# Patient Record
Sex: Male | Born: 1935
Health system: Southern US, Community
[De-identification: ages and names within clinical notes are randomized; demographics above are authoritative.]

## PROBLEM LIST (undated history)

## (undated) DIAGNOSIS — I10 Essential (primary) hypertension: Secondary | ICD-10-CM

## (undated) DIAGNOSIS — E119 Type 2 diabetes mellitus without complications: Secondary | ICD-10-CM

## (undated) DIAGNOSIS — M1990 Unspecified osteoarthritis, unspecified site: Secondary | ICD-10-CM

## (undated) HISTORY — PX: JOINT REPLACEMENT: SHX530

## (undated) HISTORY — PX: SHOULDER SURGERY: SHX246

---

## 2005-05-02 ENCOUNTER — Emergency Department (HOSPITAL_COMMUNITY): Admission: EM | Admit: 2005-05-02 | Discharge: 2005-05-02 | Payer: Self-pay | Admitting: *Deleted

## 2008-03-24 ENCOUNTER — Encounter: Admission: RE | Admit: 2008-03-24 | Discharge: 2008-03-24 | Payer: Self-pay | Admitting: Orthopaedic Surgery

## 2014-04-03 DIAGNOSIS — I10 Essential (primary) hypertension: Secondary | ICD-10-CM | POA: Diagnosis not present

## 2014-04-03 DIAGNOSIS — E119 Type 2 diabetes mellitus without complications: Secondary | ICD-10-CM | POA: Diagnosis not present

## 2014-04-03 DIAGNOSIS — M109 Gout, unspecified: Secondary | ICD-10-CM | POA: Diagnosis not present

## 2014-04-03 DIAGNOSIS — F419 Anxiety disorder, unspecified: Secondary | ICD-10-CM | POA: Diagnosis not present

## 2014-04-03 DIAGNOSIS — M5430 Sciatica, unspecified side: Secondary | ICD-10-CM | POA: Diagnosis not present

## 2014-06-25 DIAGNOSIS — E119 Type 2 diabetes mellitus without complications: Secondary | ICD-10-CM | POA: Diagnosis not present

## 2014-06-25 DIAGNOSIS — E559 Vitamin D deficiency, unspecified: Secondary | ICD-10-CM | POA: Diagnosis not present

## 2014-06-25 DIAGNOSIS — I10 Essential (primary) hypertension: Secondary | ICD-10-CM | POA: Diagnosis not present

## 2014-06-25 DIAGNOSIS — E139 Other specified diabetes mellitus without complications: Secondary | ICD-10-CM | POA: Diagnosis not present

## 2014-06-25 DIAGNOSIS — Z79899 Other long term (current) drug therapy: Secondary | ICD-10-CM | POA: Diagnosis not present

## 2014-07-02 DIAGNOSIS — E119 Type 2 diabetes mellitus without complications: Secondary | ICD-10-CM | POA: Diagnosis not present

## 2014-07-02 DIAGNOSIS — M5430 Sciatica, unspecified side: Secondary | ICD-10-CM | POA: Diagnosis not present

## 2014-07-02 DIAGNOSIS — F419 Anxiety disorder, unspecified: Secondary | ICD-10-CM | POA: Diagnosis not present

## 2014-07-02 DIAGNOSIS — M109 Gout, unspecified: Secondary | ICD-10-CM | POA: Diagnosis not present

## 2014-07-02 DIAGNOSIS — E139 Other specified diabetes mellitus without complications: Secondary | ICD-10-CM | POA: Diagnosis not present

## 2014-07-02 DIAGNOSIS — I10 Essential (primary) hypertension: Secondary | ICD-10-CM | POA: Diagnosis not present

## 2014-10-06 DIAGNOSIS — E559 Vitamin D deficiency, unspecified: Secondary | ICD-10-CM | POA: Diagnosis not present

## 2014-10-06 DIAGNOSIS — F419 Anxiety disorder, unspecified: Secondary | ICD-10-CM | POA: Diagnosis not present

## 2014-10-06 DIAGNOSIS — E119 Type 2 diabetes mellitus without complications: Secondary | ICD-10-CM | POA: Diagnosis not present

## 2014-10-06 DIAGNOSIS — I10 Essential (primary) hypertension: Secondary | ICD-10-CM | POA: Diagnosis not present

## 2014-10-06 DIAGNOSIS — G4733 Obstructive sleep apnea (adult) (pediatric): Secondary | ICD-10-CM | POA: Diagnosis not present

## 2014-10-06 DIAGNOSIS — E139 Other specified diabetes mellitus without complications: Secondary | ICD-10-CM | POA: Diagnosis not present

## 2014-10-06 DIAGNOSIS — M5430 Sciatica, unspecified side: Secondary | ICD-10-CM | POA: Diagnosis not present

## 2014-11-24 DIAGNOSIS — H35372 Puckering of macula, left eye: Secondary | ICD-10-CM | POA: Diagnosis not present

## 2014-11-24 DIAGNOSIS — H40013 Open angle with borderline findings, low risk, bilateral: Secondary | ICD-10-CM | POA: Diagnosis not present

## 2014-11-24 DIAGNOSIS — Z961 Presence of intraocular lens: Secondary | ICD-10-CM | POA: Diagnosis not present

## 2014-11-24 DIAGNOSIS — E119 Type 2 diabetes mellitus without complications: Secondary | ICD-10-CM | POA: Diagnosis not present

## 2014-12-21 DIAGNOSIS — I10 Essential (primary) hypertension: Secondary | ICD-10-CM | POA: Diagnosis not present

## 2014-12-21 DIAGNOSIS — E119 Type 2 diabetes mellitus without complications: Secondary | ICD-10-CM | POA: Diagnosis not present

## 2015-01-15 DIAGNOSIS — E119 Type 2 diabetes mellitus without complications: Secondary | ICD-10-CM | POA: Diagnosis not present

## 2015-01-15 DIAGNOSIS — I10 Essential (primary) hypertension: Secondary | ICD-10-CM | POA: Diagnosis not present

## 2015-02-16 DIAGNOSIS — M1990 Unspecified osteoarthritis, unspecified site: Secondary | ICD-10-CM | POA: Diagnosis not present

## 2015-02-16 DIAGNOSIS — I10 Essential (primary) hypertension: Secondary | ICD-10-CM | POA: Diagnosis not present

## 2015-03-13 DIAGNOSIS — M1990 Unspecified osteoarthritis, unspecified site: Secondary | ICD-10-CM | POA: Diagnosis not present

## 2015-03-13 DIAGNOSIS — I10 Essential (primary) hypertension: Secondary | ICD-10-CM | POA: Diagnosis not present

## 2015-03-21 ENCOUNTER — Encounter (HOSPITAL_COMMUNITY): Payer: Self-pay

## 2015-03-21 ENCOUNTER — Emergency Department (HOSPITAL_COMMUNITY)
Admission: EM | Admit: 2015-03-21 | Discharge: 2015-03-21 | Disposition: A | Discharge status: Home / Self Care | Payer: Medicare Other | Attending: Emergency Medicine | Admitting: Emergency Medicine

## 2015-03-21 ENCOUNTER — Emergency Department (HOSPITAL_COMMUNITY): Payer: Medicare Other

## 2015-03-21 DIAGNOSIS — I10 Essential (primary) hypertension: Secondary | ICD-10-CM | POA: Insufficient documentation

## 2015-03-21 DIAGNOSIS — W01198A Fall on same level from slipping, tripping and stumbling with subsequent striking against other object, initial encounter: Secondary | ICD-10-CM | POA: Diagnosis not present

## 2015-03-21 DIAGNOSIS — S0101XA Laceration without foreign body of scalp, initial encounter: Secondary | ICD-10-CM

## 2015-03-21 DIAGNOSIS — E119 Type 2 diabetes mellitus without complications: Secondary | ICD-10-CM | POA: Insufficient documentation

## 2015-03-21 DIAGNOSIS — Y998 Other external cause status: Secondary | ICD-10-CM | POA: Insufficient documentation

## 2015-03-21 DIAGNOSIS — S0990XA Unspecified injury of head, initial encounter: Secondary | ICD-10-CM | POA: Diagnosis not present

## 2015-03-21 DIAGNOSIS — Z8739 Personal history of other diseases of the musculoskeletal system and connective tissue: Secondary | ICD-10-CM | POA: Insufficient documentation

## 2015-03-21 DIAGNOSIS — Y9389 Activity, other specified: Secondary | ICD-10-CM | POA: Insufficient documentation

## 2015-03-21 DIAGNOSIS — S199XXA Unspecified injury of neck, initial encounter: Secondary | ICD-10-CM | POA: Diagnosis not present

## 2015-03-21 DIAGNOSIS — Y9289 Other specified places as the place of occurrence of the external cause: Secondary | ICD-10-CM | POA: Diagnosis not present

## 2015-03-21 HISTORY — DX: Essential (primary) hypertension: I10

## 2015-03-21 HISTORY — DX: Unspecified osteoarthritis, unspecified site: M19.90

## 2015-03-21 HISTORY — DX: Type 2 diabetes mellitus without complications: E11.9

## 2015-03-21 LAB — CBC
HCT: 39.3 % (ref 39.0–52.0)
Hemoglobin: 10.1 g/dL — ABNORMAL LOW (ref 13.0–17.0)
MCH: 23.8 pg — ABNORMAL LOW (ref 26.0–34.0)
MCHC: 25.7 g/dL — ABNORMAL LOW (ref 30.0–36.0)
MCV: 92.7 fL (ref 78.0–100.0)
Platelets: 205 10*3/uL (ref 150–400)
RBC: 4.24 MIL/uL (ref 4.22–5.81)
RDW: 14.6 % (ref 11.5–15.5)
WBC: 11.4 10*3/uL — ABNORMAL HIGH (ref 4.0–10.5)

## 2015-03-21 LAB — BASIC METABOLIC PANEL
Anion gap: 9 (ref 5–15)
BUN: 43 mg/dL — ABNORMAL HIGH (ref 6–20)
CO2: 24 mmol/L (ref 22–32)
Calcium: 9 mg/dL (ref 8.9–10.3)
Chloride: 107 mmol/L (ref 101–111)
Creatinine, Ser: 1.21 mg/dL (ref 0.61–1.24)
GFR calc Af Amer: >60 mL/min (ref >60)
GFR calc non Af Amer: 54 mL/min — ABNORMAL LOW (ref >60)
Glucose, Bld: 128 mg/dL — ABNORMAL HIGH (ref 65–99)
Potassium: 4.6 mmol/L (ref 3.5–5.1)
Sodium: 140 mmol/L (ref 135–145)

## 2015-03-21 MED ORDER — LIDOCAINE-EPINEPHRINE 1 %-1:100000 IJ SOLN
INTRAMUSCULAR | Status: AC
Start: 2015-03-21 — End: 2015-03-21
  Administered 2015-03-21: 3 mL
  Filled 2015-03-21: qty 1

## 2015-03-21 NOTE — Discharge Instructions (Signed)
Please read and follow all provided instructions.  Your diagnoses today include:  1. Scalp laceration, initial encounter    Tests performed today include:  Vital signs. See below for your results today.   Medications prescribed:   None   Home care instructions:  Follow any educational materials contained in this packet.  Follow-up instructions: Please follow-up with your primary care provider or Emergency Department in 7 days for removal of staples.   Return instructions:   Please return to the Emergency Department if you do not get better, if you get worse, or new symptoms OR  - Fever (temperature greater than 101.81F)  - Bleeding that does not stop with holding pressure to the area    -Severe pain (please note that you may be more sore the day after your accident)  - Chest Pain  - Difficulty breathing  - Severe nausea or vomiting  - Inability to tolerate food and liquids  - Passing out  - Skin becoming red around your wounds  - Change in mental status (confusion or lethargy)  - New numbness or weakness     Please return if you have any other emergent concerns.  Additional Information:  Your vital signs today were: BP 147/75 mmHg   Pulse 89   Temp(Src) 98 F (36.7 C) (Oral)   Resp 18   SpO2 98% If your blood pressure (BP) was elevated above 135/85 this visit, please have this repeated by your doctor within one month. ---------------

## 2015-03-21 NOTE — ED Notes (Addendum)
Pt c/o posterior head laceration after a fall this morning.  Pain score 2/10.  Pt reports "I got up, before I woke up all the way.  I misstepped and fell."  Denies LOC and dizziness.  Denies blood thinners.  Pt reports hitting his head on his nightstand.

## 2015-03-21 NOTE — ED Provider Notes (Signed)
CSN: [id number]     Arrival date & time 03/21/15  [id number] History   First MD Initiated Contact with Patient 03/21/15 [phone]     Chief Complaint  Patient presents with  . Fall  . Head Laceration   (Consider location/radiation/quality/duration/timing/severity/associated sxs/prior Treatment) HPI 82 y.o. male with a hx of HTN, DM, presents to the Emergency Department today after a fall sustained this morning. States that he woke up and sat on the side of the bed. As he stood up, he lost his balance and hit the right side of his head on the nightstand. Pt remembers the incident. Denies LOC. Pt laid on the floor and told his wife to call EMS. Pain currently 2/10 on posterior scalp. No headache/dizziness, no N/V/D, No vision changes, no CP/SOB/ABD pain. No numbness/tingling in extremities. Pt is not on any blood thinners. No other symptoms noted.      Past Medical History  Diagnosis Date  . Diabetes mellitus without complication ([place])   . Hypertension   . Arthritis    Past Surgical History  Procedure Laterality Date  . Joint replacement Bilateral   . Shoulder surgery     History reviewed. No pertinent family history. Social History  Substance Use Topics  . Smoking status: Never Smoker   . Smokeless tobacco: None  . Alcohol Use: No    Review of Systems ROS reviewed and all are negative for acute change except as noted in the HPI.  Allergies  Review of patient's allergies indicates not on file.  Home Medications   Prior to Admission medications   Not on File   BP 147/75 mmHg  Pulse 89  Temp(Src) 98 F (36.7 C) (Oral)  Resp 18  SpO2 98%   Physical Exam  Constitutional: He is oriented to person, place, and time. He appears well-developed and well-nourished.  HENT:  Head: Normocephalic and atraumatic.  Right Ear: Tympanic membrane, external ear and ear canal normal. No hemotympanum.  Left Ear: Tympanic membrane, external ear and ear canal normal. No hemotympanum.  Nose: Nose  normal.  Mouth/Throat: Uvula is midline, oropharynx is clear and moist and mucous membranes are normal.  Posterior head laceration on right side 2-3cm  Eyes: EOM are normal. Pupils are equal, round, and reactive to light.  Neck: Trachea normal, normal range of motion and full passive range of motion without pain. Neck supple.  Cardiovascular: Normal rate, regular rhythm, S1 normal, S2 normal and normal heart sounds.   Pulmonary/Chest: Effort normal and breath sounds normal.  Abdominal: Soft. Normal appearance and bowel sounds are normal.  Musculoskeletal: Normal range of motion.       Cervical back: Normal. He exhibits normal range of motion, no tenderness and no pain.  Neurological: He is alert and oriented to person, place, and time. He has normal strength. No cranial nerve deficit or sensory deficit.  Skin: Skin is warm and dry.  Psychiatric: He has a normal mood and affect. His behavior is normal. Thought content normal.  Nursing note and vitals reviewed.   ED Course  .Marland KitchenLaceration Repair Date/Time: 03/21/2015 11:07 AM Performed by: Shary Decamp Authorized by: Shary Decamp Consent: Verbal consent obtained. Risks and benefits: risks, benefits and alternatives were discussed Consent given by: patient Patient understanding: patient states understanding of the procedure being performed Patient consent: the patient's understanding of the procedure matches consent given Procedure consent: procedure consent matches procedure scheduled Patient identity confirmed: verbally with patient and arm band Body area: head/neck Location details: scalp Laceration length: 3  cm Foreign bodies: no foreign bodies Tendon involvement: none Nerve involvement: none Vascular damage: no Anesthesia: local infiltration Local anesthetic: lidocaine 1% with epinephrine Anesthetic total: 1 ml Patient sedated: no Preparation: Patient was prepped and draped in the usual sterile fashion. Irrigation solution:  saline Amount of cleaning: standard Debridement: none Degree of undermining: none Skin closure: staples Number of sutures: 3 Approximation: close Patient tolerance: Patient tolerated the procedure well with no immediate complications   (including critical care time) Labs Review Labs Reviewed  CBC - Abnormal; Notable for the following:    WBC 11.4 (*)    Hemoglobin 10.1 (*)    MCH 23.8 (*)    MCHC 25.7 (*)    All other components within normal limits  BASIC METABOLIC PANEL - Abnormal; Notable for the following:    Glucose, Bld 128 (*)    BUN 43 (*)    GFR calc non Af Amer 54 (*)    All other components within normal limits   Imaging Review Ct Head Wo Contrast  03/21/2015  CLINICAL DATA:  Recent fall with posterior head laceration EXAM: CT HEAD WITHOUT CONTRAST CT CERVICAL SPINE WITHOUT CONTRAST TECHNIQUE: Multidetector CT imaging of the head and cervical spine was performed following the standard protocol without intravenous contrast. Multiplanar CT image reconstructions of the cervical spine were also generated. COMPARISON:  None. FINDINGS: CT HEAD FINDINGS The bony calvarium is intact. Mild atrophic changes and chronic white matter ischemic change is noted. No findings to suggest acute hemorrhage, acute infarction or space-occupying mass lesion are noted. CT CERVICAL SPINE FINDINGS Seven cervical segments are well visualized. Vertebral body height is well maintained. Mild degenerative anterolisthesis of C3 on C4 is noted. Multilevel osteophytic changes and disc space narrowing is seen. Multilevel neural foraminal narrowing is noted secondary to degenerative changes. No acute fracture is seen. No gross soft tissue abnormality is noted. IMPRESSION: CT of the head: Chronic atrophic and ischemic changes without acute abnormality. CT of the cervical spine: Multilevel degenerative change without acute abnormality. Electronically Signed   By: Inez Catalina M.D.   On: 03/21/2015 10:01   Ct  Cervical Spine Wo Contrast  03/21/2015  CLINICAL DATA:  Recent fall with posterior head laceration EXAM: CT HEAD WITHOUT CONTRAST CT CERVICAL SPINE WITHOUT CONTRAST TECHNIQUE: Multidetector CT imaging of the head and cervical spine was performed following the standard protocol without intravenous contrast. Multiplanar CT image reconstructions of the cervical spine were also generated. COMPARISON:  None. FINDINGS: CT HEAD FINDINGS The bony calvarium is intact. Mild atrophic changes and chronic white matter ischemic change is noted. No findings to suggest acute hemorrhage, acute infarction or space-occupying mass lesion are noted. CT CERVICAL SPINE FINDINGS Seven cervical segments are well visualized. Vertebral body height is well maintained. Mild degenerative anterolisthesis of C3 on C4 is noted. Multilevel osteophytic changes and disc space narrowing is seen. Multilevel neural foraminal narrowing is noted secondary to degenerative changes. No acute fracture is seen. No gross soft tissue abnormality is noted. IMPRESSION: CT of the head: Chronic atrophic and ischemic changes without acute abnormality. CT of the cervical spine: Multilevel degenerative change without acute abnormality. Electronically Signed   By: Inez Catalina M.D.   On: 03/21/2015 10:01   I have personally reviewed and evaluated these images and lab results as part of my medical decision-making.   EKG Interpretation   Date/Time:  Sunday March 21 2015 10:32:21 EST Ventricular Rate:  86 PR Interval:    QRS Duration: 116 QT Interval:  366  QTC Calculation: 438 R Axis:   13 Text Interpretation:  Nonspecific intraventricular conduction delay  Inferior infarct, old baseline artifact. no STEMI..c/w old no sig change  Confirmed by Johnney Killian, MD, Jeannie Done [phone]) on 03/21/2015 11:03:31 AM     MDM  I have reviewed relevant laboratory values. I have reviewed relevant imaging studies. I personally interpreted the relevant EKG. I have reviewed the  relevant previous healthcare records. I have reviewed EMS Documentation. I obtained HPI from historian. Patient discussed with supervising physician  ED Course:  Assessment: 33 y M with hx DM, HTN presents after a fall sustained this morning. No LOC. He remembers the event clearly. No CN deficits appreciated. No motor/sensory deficits. No headache/vision changes. EKG unremarkable. CBC/BMP showed Hgb at 10.1 with no previous labs to compare. CT Head/Neck show no acute abnormalities. Pt has 2-3cm posterior laceration that was close with 3 staples. Will DC and have them f/u with PCP or ED for staples to be removed.   Patient is in no acute distress. Vital Signs are stable. Patient is able to ambulate. Patient able to tolerate PO.    Disposition/Plan:  DC Home Additional Verbal discharge instructions given and discussed with patient.  Pt Instructed to f/u with PCP or ED in the next 48 hours for evaluation and treatment of symptoms. Return precautions given Pt acknowledges and agrees with plan  Supervising Physician Quintella Reichert, MD   Final diagnoses:  Scalp laceration, initial encounter      Shary Decamp, PA-C 03/21/15 [place], MD 03/23/15 [phone]

## 2015-03-28 ENCOUNTER — Emergency Department (HOSPITAL_COMMUNITY)
Admission: EM | Admit: 2015-03-28 | Discharge: 2015-03-28 | Disposition: A | Discharge status: Home / Self Care | Payer: Medicare Other | Attending: Emergency Medicine | Admitting: Emergency Medicine

## 2015-03-28 ENCOUNTER — Encounter (HOSPITAL_COMMUNITY): Payer: Self-pay | Admitting: *Deleted

## 2015-03-28 DIAGNOSIS — Z7984 Long term (current) use of oral hypoglycemic drugs: Secondary | ICD-10-CM | POA: Insufficient documentation

## 2015-03-28 DIAGNOSIS — Z79899 Other long term (current) drug therapy: Secondary | ICD-10-CM | POA: Insufficient documentation

## 2015-03-28 DIAGNOSIS — Z7982 Long term (current) use of aspirin: Secondary | ICD-10-CM | POA: Diagnosis not present

## 2015-03-28 DIAGNOSIS — I10 Essential (primary) hypertension: Secondary | ICD-10-CM | POA: Insufficient documentation

## 2015-03-28 DIAGNOSIS — E119 Type 2 diabetes mellitus without complications: Secondary | ICD-10-CM | POA: Diagnosis not present

## 2015-03-28 DIAGNOSIS — Z4802 Encounter for removal of sutures: Secondary | ICD-10-CM | POA: Diagnosis not present

## 2015-03-28 DIAGNOSIS — M1990 Unspecified osteoarthritis, unspecified site: Secondary | ICD-10-CM | POA: Diagnosis not present

## 2015-03-28 NOTE — ED Notes (Signed)
Awaiting pt's arrival to room.

## 2015-03-28 NOTE — ED Notes (Signed)
Pt here for staple removal, 3 noted to right parietal.

## 2015-03-28 NOTE — ED Provider Notes (Signed)
CSN: [id number]     Arrival date & time 03/28/15  1459 History  By signing my name below, I, William Mullins, attest that this documentation has been prepared under the direction and in the presence of William Ramus, PA-C Electronically Signed: Soijett Mullins, ED Scribe. 03/28/2015. 4:40 PM.   Chief Complaint  Patient presents with  . Suture / Staple Removal      The history is provided by the patient. No language interpreter was used.    William Mullins is a 82 y.o. male with a PMHx of DM, HTN, who presents to the Emergency Department complaining of suture removal onset today. Pt had the sutures placed to his posterior right side of his head because of a mechanical fall. Denies any other recent falls. He states that he has not been using any medications or antibiotic ointment. Denies drainage, fever, HA, redness, swelling and any other symptoms.  Per pt chart review: Pt was seen in the ED on 03/21/2015 for a head laceration due to a mechanical fall. Pt had 3 staples placed to posterior right side of head and was informed to follow up for suture removal.  Past Medical History  Diagnosis Date  . Diabetes mellitus without complication ([place])   . Hypertension   . Arthritis    Past Surgical History  Procedure Laterality Date  . Joint replacement Bilateral   . Shoulder surgery     No family history on file. Social History  Substance Use Topics  . Smoking status: Never Smoker   . Smokeless tobacco: None  . Alcohol Use: No    Review of Systems  Constitutional: Negative for fever.  Skin: Positive for wound.       3 staples  Neurological: Negative for headaches.  All other systems reviewed and are negative.     Allergies  Review of patient's allergies indicates no known allergies.  Home Medications   Prior to Admission medications   Medication Sig Start Date End Date Taking? Authorizing Provider  allopurinol (ZYLOPRIM) 100 MG tablet Take 100 mg by mouth daily. 03/17/15   Historical  Provider, MD  amLODipine (NORVASC) 5 MG tablet Take 5 mg by mouth daily.    Historical Provider, MD  aspirin EC 81 MG tablet Take 81 mg by mouth daily.    Historical Provider, MD  benazepril (LOTENSIN) 10 MG tablet Take 10 mg by mouth daily.    Historical Provider, MD  cholecalciferol (VITAMIN D) 1000 units tablet Take 1,000 Units by mouth daily.    Historical Provider, MD  clonazePAM (KLONOPIN) 1 MG tablet Take 1 mg by mouth at bedtime.    Historical Provider, MD  Cyanocobalamin (VITAMIN B 12 PO) Take 1 tablet by mouth daily.    Historical Provider, MD  etodolac (LODINE) 400 MG tablet Take 400 mg by mouth 2 (two) times daily.    Historical Provider, MD  metFORMIN (GLUCOPHAGE) 500 MG tablet Take 500 mg by mouth daily.    Historical Provider, MD  Multiple Vitamin (MULTIVITAMIN WITH MINERALS) TABS tablet Take 1 tablet by mouth daily.    Historical Provider, MD  oxyCODONE-acetaminophen (PERCOCET) 10-325 MG tablet Take 1 tablet by mouth every 6 (six) hours as needed for pain.    Historical Provider, MD  vitamin C (ASCORBIC ACID) 500 MG tablet Take 500 mg by mouth daily.    Historical Provider, MD  vitamin E 400 UNIT capsule Take 400 Units by mouth daily.    Historical Provider, MD   BP 129/78 mmHg  Pulse 89  Temp(Src) 98 F (36.7 C) (Oral)  Resp 18  SpO2 98% Physical Exam  Constitutional: He is oriented to person, place, and time. He appears well-developed and well-nourished. No distress.  HENT:  Head: Normocephalic and atraumatic.  Well healing laceration to right posterior scalp. No surrounding swelling, erythema, warmth, or drainage. 3 staples intact.   Eyes: Conjunctivae and EOM are normal. Right eye exhibits no discharge. Left eye exhibits no discharge. No scleral icterus.  Neck: Neck supple.  Cardiovascular: Normal rate.   Pulmonary/Chest: Effort normal. No respiratory distress.  Musculoskeletal: Normal range of motion.  Neurological: He is alert and oriented to person, place, and  time.  Skin: Skin is warm and dry.  Psychiatric: He has a normal mood and affect. His behavior is normal.  Nursing note and vitals reviewed.   ED Course  Procedures (including critical care time) DIAGNOSTIC STUDIES: Oxygen Saturation is 98% on RA, nl by my interpretation.    COORDINATION OF CARE: 4:40 PM Discussed treatment plan with pt at bedside which includes staple removal and pt agreed to plan.    Labs Review Labs Reviewed - No data to display  Imaging Review No results found.   Filed Vitals:   03/28/15 1629  BP: 129/78  Pulse: 89  Temp: 98 F (36.7 C)  Resp: 18     MDM   Final diagnoses:  Encounter for staple removal    Staple removal   Pt to ER for staple removal and wound check as above. Procedure tolerated well. Vitals normal, no signs of infection. Scar minimization & return precautions given at discharge.   I personally performed the services described in this documentation, which was scribed in my presence. The recorded information has been reviewed and is accurate.    Chesley Noon Driscoll, [place] 03/28/15 1650  Blanchie Dessert, MD 03/29/15 [phone]

## 2015-03-28 NOTE — Discharge Instructions (Signed)
Continue taking your home medications as prescribed. I recommend applying bacitracin antibiotic ointment to your wound daily. Keep wound clean using soap and water and dry. Call your primary care providers office tomorrow to schedule a follow-up appointment for your recent fall. Return to the emergency department if symptoms worsen or new onset of fever, headache, redness, swelling, drainage.   Emergency Department Resource Guide 1) Find a Doctor and Pay Out of Pocket Although you won't have to find out who is covered by your insurance plan, it is a good idea to ask around and get recommendations. You will then need to call the office and see if the doctor you have chosen will accept you as a new patient and what types of options they offer for patients who are self-pay. Some doctors offer discounts or will set up payment plans for their patients who do not have insurance, but you will need to ask so you aren't surprised when you get to your appointment.  2) Contact Your Local Health Department Not all health departments have doctors that can see patients for sick visits, but many do, so it is worth a call to see if yours does. If you don't know where your local health department is, you can check in your phone book. The CDC also has a tool to help you locate your state's health department, and many state websites also have listings of all of their local health departments.  3) Find a [facility] If your illness is not likely to be very severe or complicated, you may want to try a walk in clinic. These are popping up all over the country in pharmacies, drugstores, and shopping centers. They're usually staffed by nurse practitioners or physician assistants that have been trained to treat common illnesses and complaints. They're usually fairly quick and inexpensive. However, if you have serious medical issues or chronic medical problems, these are probably not your best option.  No Primary Care  Doctor: - Call Health Connect at  [phone] - they can help you locate a primary care doctor that  accepts your insurance, provides certain services, etc. - Physician Referral Service- [phone]  Chronic Pain Problems: Organization         Address  Phone   Notes  [facility]  [phone] Patients need to be referred by their primary care doctor.   Medication Assistance: Organization         Address  Phone   Notes  [facility] Medication [facility] [address] [place], [place] [address] [phone] --Must be a resident of [facility] -- Must have NO insurance coverage whatsoever (no Medicaid/ Medicare, etc.) -- The pt. MUST have a primary care doctor that directs their care regularly and follows them in the community   Med[facility]ist  [phone]   [employer]  [phone]    Agencies that provide inexpensive medical care: Organization         Address  Phone   Notes  [place]  [phone]   [facility]    [phone]   [facility] [place], [place] [address] [phone]   [place] [address] [place] [phone]   Planned Parenthood    9370537048   [facility]    [phone]   [place] and [place], [employer] Phone:  [phone],  Fax:  [phone] Hours of Operation:  9 am - 6 pm, M-F.  Also accepts Medicaid/Medicare and self-pay.  [facility] for [place], [address], [place] Phone: [phone], Fax: [phone]. Hours of Operation:  8:30 am - 5:30 pm, M-F.  Also accepts Medicaid and self-pay.  [facility] [place] [address] [place] Phone: [phone]   [place], [place], [place] [phone], Ext. [phone] Mondays & Thursdays: 7-9 AM.  First 15 patients are seen on a first  come, first serve basis.    [place] Providers:  Organization         Address  Phone   Notes  [facility] [address], [place] [phone] Also accepts self-pay patients.  [facility] [address], [place]  [phone]   [place], [address], [place] [phone]   [facility] [address] [place] [phone]   Lucianne Lei [address], [place]   [phone] Only accepts [place] Access [place] patients after they have their name applied to their card.   Self-Pay (no insurance) in [facility]:  Organization         Address  Phone   Notes  Sickle Cell Patients, [facility] Internal Medicine [place] [phone]   [facility] Urgent Care [place] [phone]   [facility] [place]  [place], [place], [place] [phone]   [facility]/Dr. Osei-Bonsu  [address] [place] or [address], [place] [phone] Phone number for both [place] and [place] locations is the same.  [facility] [address] [place] [phone]   [facility] [address], [place] or [address] [phone]   [facility] [address] [place] [phone], phone; [phone], fax Sees patients 1st and 3rd Saturday of every month.  Must not qualify for public or private insurance (i.e. Medicaid, Medicare, [place] Health Choice, Veterans' Benefits)  Household income should be no more than 200% of the poverty level The clinic cannot treat you if you are pregnant or think you are pregnant  Sexually transmitted diseases are not treated at the clinic.    Dental Care: Organization          Address  Phone  Notes  [facility] of [facility] [place] [phone] Accepts children up to age 36 who are enrolled in [place] or [place]; pregnant women with a Medicaid card; and children who have applied for Medicaid or [place] Health Choice, but were declined, whose parents can pay a reduced fee at time of service.  [facility] Department of [facility]  [address], [place] [phone] Accepts children up to age 98 who are enrolled in [place] or [place]; pregnant women with a Medicaid card; and children who have applied for Medicaid or [place] Health Choice, but were declined, whose parents can pay a reduced fee at time of service.  [place] Adult Dental Access PROGRAM  [place] [phone] Patients are seen by appointment only. Walk-ins are not  accepted. [place] will see patients 13 years of age and older. Monday - Tuesday (8am-5pm) Most Wednesdays (8:30-5pm) $30 per visit, cash only  [facility] Adult Dental Access PROGRAM  [address], [facility] [phone] Patients are seen by appointment only. Walk-ins are not accepted. [place] will see patients 33 years of age and older. One Wednesday Evening (Monthly: Volunteer Based).  $30 per visit, cash only  [place]  [phone] for adults; Children under age 30, call Graduate Pediatric Dentistry at [phone]. Children aged 41-14, please call [phone] to request a pediatric application.  Dental services are provided in all areas of dental care including fillings, crowns and bridges, complete and partial dentures, implants, gum treatment, root canals, and extractions. Preventive care is also provided. Treatment is provided to both adults and children. Patients are selected via a lottery and there is often a waiting list.   [facility] [address] [place]  [phone] [email or web address]   [facility] [address] [place], [place] [phone], Ext. [phone] Second and Fourth Thursday of each month, opens at 6:30 AM; Clinic ends at 9 AM.  Patients are seen on a first-come first-served basis, and a limited number are seen during each clinic.   [facility]  [address] Hillard Danker [place], [place] [phone]   Eligibility Requirements You must have lived in [place], [place], or [place] counties for at least the last three months.   You cannot be eligible for state or federal sponsored [employer], including [employer], [place], or [employer].   You generally cannot be eligible for healthcare insurance through your employer.    How to apply: Eligibility screenings are held every Tuesday and Wednesday afternoon from 1:00 pm until 4:00 pm. You do not need an appointment for the interview!  [facility] [address], [place], [place]   [place]  [employer] Department  [place]  [phone]    Behavioral Health Resources in the Community: Intensive Outpatient Programs Organization         Address  Phone  Notes  [place]. [address], [place], [place] [phone]   [facility] Outpatient [address] [place], [place]   ADS: Alcohol & Drug Svcs [address] [place], [place]   [place] [address],  [place], [place] or [phone]   Substance Abuse Resources Organization         Address  Phone  Notes  Alcohol and Drug Services  [phone]   [place]  [phone]   [facility] [place]   Chinita Pester  [phone]   Residential & Outpatient Substance Abuse Program  [phone]   Psychological  Services Organization         Address  Phone  Notes  [facility] [place]  [place]  [phone]   [place] [address], [place] or [phone]    Mobile Crisis Teams Organization         Address  Phone  Notes  [facility], Mobile Crisis Care Unit  [phone]   [facility]  [address] [place], [place]   [facility] [address] [place] [phone]    Self-Help/Support Groups  Organization         Address  Phone             Notes  [facility]ental [facility]oc[facility] of [place] - variety of support groups  [place] Call for more information  Narcotics Anonymous (NA), [facility] [address], [employer] [place]  2 meetings at this location   Special educational needs teacher         Address  Phone  Notes  [facility]AP Residential Treatment [place],    [place]  [phone]   [facility]  [address], [place] [address], [place], [place]   [place], [place] [phone] Admissions: 8am-3pm M-F  Incentives Substance [place] [address]    [place], [place] [phone]   [facility] [address] [place], [place], [place]   [facility] [address],  [place], [place]   [facility] Programs - Intensive Outpatient [address] Kristeen Mans [address], [place], [place]   [facility] ([place].,  [place], [place] [phone] or [phone]   [facility] Services (RTS) [address], [place], [place] Accepts Medicaid  [facility] [place] [address]  [place] [phone] Substance Abuse/Addiction Treatment   [facility] Organization         Address  Phone  Notes  [facility]  [phone]   Domenic Schwab, PhD [address] Arlis Porta [place], [place]   [phone] or [phone]   [place], [place] [phone]   [facility] [address], [place], [place] [phone] Insurance/Medicaid/sponsorship through [facility] and Families [address]                                    [place], [place] [phone] [place] [address], [place] [phone]    Dr. Adele Schilder  [phone]   [facility] Dept. 1) [address], [place] 2) [place] 3)  [place] [address], [place] [phone]  [phone]   [place] [phone] or [phone] (After Hours)

## 2015-04-07 DIAGNOSIS — H40013 Open angle with borderline findings, low risk, bilateral: Secondary | ICD-10-CM | POA: Diagnosis not present

## 2015-04-07 DIAGNOSIS — M1990 Unspecified osteoarthritis, unspecified site: Secondary | ICD-10-CM | POA: Diagnosis not present

## 2015-04-07 DIAGNOSIS — I10 Essential (primary) hypertension: Secondary | ICD-10-CM | POA: Diagnosis not present

## 2015-05-06 DIAGNOSIS — M1990 Unspecified osteoarthritis, unspecified site: Secondary | ICD-10-CM | POA: Diagnosis not present

## 2015-05-06 DIAGNOSIS — I10 Essential (primary) hypertension: Secondary | ICD-10-CM | POA: Diagnosis not present

## 2015-05-28 DIAGNOSIS — I10 Essential (primary) hypertension: Secondary | ICD-10-CM | POA: Diagnosis not present

## 2015-05-28 DIAGNOSIS — M1990 Unspecified osteoarthritis, unspecified site: Secondary | ICD-10-CM | POA: Diagnosis not present

## 2015-06-28 DIAGNOSIS — M109 Gout, unspecified: Secondary | ICD-10-CM | POA: Diagnosis not present

## 2015-06-28 DIAGNOSIS — E119 Type 2 diabetes mellitus without complications: Secondary | ICD-10-CM | POA: Diagnosis not present

## 2015-06-28 DIAGNOSIS — G8929 Other chronic pain: Secondary | ICD-10-CM | POA: Diagnosis not present

## 2015-06-28 DIAGNOSIS — R296 Repeated falls: Secondary | ICD-10-CM | POA: Diagnosis not present

## 2015-06-28 DIAGNOSIS — E6609 Other obesity due to excess calories: Secondary | ICD-10-CM | POA: Diagnosis not present

## 2015-06-28 DIAGNOSIS — D649 Anemia, unspecified: Secondary | ICD-10-CM | POA: Diagnosis not present

## 2015-06-28 DIAGNOSIS — Z7984 Long term (current) use of oral hypoglycemic drugs: Secondary | ICD-10-CM | POA: Diagnosis not present

## 2015-06-28 DIAGNOSIS — M545 Low back pain: Secondary | ICD-10-CM | POA: Diagnosis not present

## 2015-06-28 DIAGNOSIS — R202 Paresthesia of skin: Secondary | ICD-10-CM | POA: Diagnosis not present

## 2015-06-28 DIAGNOSIS — I10 Essential (primary) hypertension: Secondary | ICD-10-CM | POA: Diagnosis not present

## 2015-06-30 DIAGNOSIS — E119 Type 2 diabetes mellitus without complications: Secondary | ICD-10-CM | POA: Diagnosis not present

## 2015-06-30 DIAGNOSIS — M6281 Muscle weakness (generalized): Secondary | ICD-10-CM | POA: Diagnosis not present

## 2015-06-30 DIAGNOSIS — M109 Gout, unspecified: Secondary | ICD-10-CM | POA: Diagnosis not present

## 2015-06-30 DIAGNOSIS — Z7984 Long term (current) use of oral hypoglycemic drugs: Secondary | ICD-10-CM | POA: Diagnosis not present

## 2015-06-30 DIAGNOSIS — Z96653 Presence of artificial knee joint, bilateral: Secondary | ICD-10-CM | POA: Diagnosis not present

## 2015-06-30 DIAGNOSIS — R296 Repeated falls: Secondary | ICD-10-CM | POA: Diagnosis not present

## 2015-06-30 DIAGNOSIS — Z7982 Long term (current) use of aspirin: Secondary | ICD-10-CM | POA: Diagnosis not present

## 2015-06-30 DIAGNOSIS — I10 Essential (primary) hypertension: Secondary | ICD-10-CM | POA: Diagnosis not present

## 2015-06-30 DIAGNOSIS — Z87891 Personal history of nicotine dependence: Secondary | ICD-10-CM | POA: Diagnosis not present

## 2015-06-30 DIAGNOSIS — Z9981 Dependence on supplemental oxygen: Secondary | ICD-10-CM | POA: Diagnosis not present

## 2015-06-30 DIAGNOSIS — Z9181 History of falling: Secondary | ICD-10-CM | POA: Diagnosis not present

## 2015-07-01 DIAGNOSIS — E119 Type 2 diabetes mellitus without complications: Secondary | ICD-10-CM | POA: Diagnosis not present

## 2015-07-01 DIAGNOSIS — Z7984 Long term (current) use of oral hypoglycemic drugs: Secondary | ICD-10-CM | POA: Diagnosis not present

## 2015-07-01 DIAGNOSIS — M109 Gout, unspecified: Secondary | ICD-10-CM | POA: Diagnosis not present

## 2015-07-01 DIAGNOSIS — M6281 Muscle weakness (generalized): Secondary | ICD-10-CM | POA: Diagnosis not present

## 2015-07-01 DIAGNOSIS — R296 Repeated falls: Secondary | ICD-10-CM | POA: Diagnosis not present

## 2015-07-01 DIAGNOSIS — I10 Essential (primary) hypertension: Secondary | ICD-10-CM | POA: Diagnosis not present

## 2015-07-02 DIAGNOSIS — L603 Nail dystrophy: Secondary | ICD-10-CM | POA: Diagnosis not present

## 2015-07-02 DIAGNOSIS — L989 Disorder of the skin and subcutaneous tissue, unspecified: Secondary | ICD-10-CM | POA: Diagnosis not present

## 2015-07-02 DIAGNOSIS — R296 Repeated falls: Secondary | ICD-10-CM | POA: Diagnosis not present

## 2015-07-02 DIAGNOSIS — M109 Gout, unspecified: Secondary | ICD-10-CM | POA: Diagnosis not present

## 2015-07-02 DIAGNOSIS — E119 Type 2 diabetes mellitus without complications: Secondary | ICD-10-CM | POA: Diagnosis not present

## 2015-07-02 DIAGNOSIS — M6281 Muscle weakness (generalized): Secondary | ICD-10-CM | POA: Diagnosis not present

## 2015-07-02 DIAGNOSIS — R8290 Unspecified abnormal findings in urine: Secondary | ICD-10-CM | POA: Diagnosis not present

## 2015-07-02 DIAGNOSIS — D72829 Elevated white blood cell count, unspecified: Secondary | ICD-10-CM | POA: Diagnosis not present

## 2015-07-02 DIAGNOSIS — Z7984 Long term (current) use of oral hypoglycemic drugs: Secondary | ICD-10-CM | POA: Diagnosis not present

## 2015-07-02 DIAGNOSIS — I10 Essential (primary) hypertension: Secondary | ICD-10-CM | POA: Diagnosis not present

## 2015-07-05 DIAGNOSIS — Z7984 Long term (current) use of oral hypoglycemic drugs: Secondary | ICD-10-CM | POA: Diagnosis not present

## 2015-07-05 DIAGNOSIS — M109 Gout, unspecified: Secondary | ICD-10-CM | POA: Diagnosis not present

## 2015-07-05 DIAGNOSIS — E119 Type 2 diabetes mellitus without complications: Secondary | ICD-10-CM | POA: Diagnosis not present

## 2015-07-05 DIAGNOSIS — R296 Repeated falls: Secondary | ICD-10-CM | POA: Diagnosis not present

## 2015-07-05 DIAGNOSIS — I10 Essential (primary) hypertension: Secondary | ICD-10-CM | POA: Diagnosis not present

## 2015-07-05 DIAGNOSIS — M6281 Muscle weakness (generalized): Secondary | ICD-10-CM | POA: Diagnosis not present

## 2015-07-06 DIAGNOSIS — M109 Gout, unspecified: Secondary | ICD-10-CM | POA: Diagnosis not present

## 2015-07-06 DIAGNOSIS — I10 Essential (primary) hypertension: Secondary | ICD-10-CM | POA: Diagnosis not present

## 2015-07-06 DIAGNOSIS — R296 Repeated falls: Secondary | ICD-10-CM | POA: Diagnosis not present

## 2015-07-06 DIAGNOSIS — E119 Type 2 diabetes mellitus without complications: Secondary | ICD-10-CM | POA: Diagnosis not present

## 2015-07-06 DIAGNOSIS — Z7984 Long term (current) use of oral hypoglycemic drugs: Secondary | ICD-10-CM | POA: Diagnosis not present

## 2015-07-06 DIAGNOSIS — M6281 Muscle weakness (generalized): Secondary | ICD-10-CM | POA: Diagnosis not present

## 2015-07-07 DIAGNOSIS — M6281 Muscle weakness (generalized): Secondary | ICD-10-CM | POA: Diagnosis not present

## 2015-07-07 DIAGNOSIS — R296 Repeated falls: Secondary | ICD-10-CM | POA: Diagnosis not present

## 2015-07-07 DIAGNOSIS — M109 Gout, unspecified: Secondary | ICD-10-CM | POA: Diagnosis not present

## 2015-07-07 DIAGNOSIS — I10 Essential (primary) hypertension: Secondary | ICD-10-CM | POA: Diagnosis not present

## 2015-07-07 DIAGNOSIS — E119 Type 2 diabetes mellitus without complications: Secondary | ICD-10-CM | POA: Diagnosis not present

## 2015-07-07 DIAGNOSIS — Z7984 Long term (current) use of oral hypoglycemic drugs: Secondary | ICD-10-CM | POA: Diagnosis not present

## 2015-07-09 DIAGNOSIS — I10 Essential (primary) hypertension: Secondary | ICD-10-CM | POA: Diagnosis not present

## 2015-07-09 DIAGNOSIS — Z7984 Long term (current) use of oral hypoglycemic drugs: Secondary | ICD-10-CM | POA: Diagnosis not present

## 2015-07-09 DIAGNOSIS — E119 Type 2 diabetes mellitus without complications: Secondary | ICD-10-CM | POA: Diagnosis not present

## 2015-07-09 DIAGNOSIS — R296 Repeated falls: Secondary | ICD-10-CM | POA: Diagnosis not present

## 2015-07-09 DIAGNOSIS — M6281 Muscle weakness (generalized): Secondary | ICD-10-CM | POA: Diagnosis not present

## 2015-07-09 DIAGNOSIS — M109 Gout, unspecified: Secondary | ICD-10-CM | POA: Diagnosis not present

## 2015-07-12 DIAGNOSIS — I10 Essential (primary) hypertension: Secondary | ICD-10-CM | POA: Diagnosis not present

## 2015-07-12 DIAGNOSIS — Z7984 Long term (current) use of oral hypoglycemic drugs: Secondary | ICD-10-CM | POA: Diagnosis not present

## 2015-07-12 DIAGNOSIS — E119 Type 2 diabetes mellitus without complications: Secondary | ICD-10-CM | POA: Diagnosis not present

## 2015-07-12 DIAGNOSIS — M6281 Muscle weakness (generalized): Secondary | ICD-10-CM | POA: Diagnosis not present

## 2015-07-12 DIAGNOSIS — M109 Gout, unspecified: Secondary | ICD-10-CM | POA: Diagnosis not present

## 2015-07-12 DIAGNOSIS — R296 Repeated falls: Secondary | ICD-10-CM | POA: Diagnosis not present

## 2015-07-13 DIAGNOSIS — E119 Type 2 diabetes mellitus without complications: Secondary | ICD-10-CM | POA: Diagnosis not present

## 2015-07-13 DIAGNOSIS — M6281 Muscle weakness (generalized): Secondary | ICD-10-CM | POA: Diagnosis not present

## 2015-07-13 DIAGNOSIS — R296 Repeated falls: Secondary | ICD-10-CM | POA: Diagnosis not present

## 2015-07-13 DIAGNOSIS — Z7984 Long term (current) use of oral hypoglycemic drugs: Secondary | ICD-10-CM | POA: Diagnosis not present

## 2015-07-13 DIAGNOSIS — I10 Essential (primary) hypertension: Secondary | ICD-10-CM | POA: Diagnosis not present

## 2015-07-13 DIAGNOSIS — M109 Gout, unspecified: Secondary | ICD-10-CM | POA: Diagnosis not present

## 2015-07-15 DIAGNOSIS — M6281 Muscle weakness (generalized): Secondary | ICD-10-CM | POA: Diagnosis not present

## 2015-07-15 DIAGNOSIS — R296 Repeated falls: Secondary | ICD-10-CM | POA: Diagnosis not present

## 2015-07-15 DIAGNOSIS — Z7984 Long term (current) use of oral hypoglycemic drugs: Secondary | ICD-10-CM | POA: Diagnosis not present

## 2015-07-15 DIAGNOSIS — I10 Essential (primary) hypertension: Secondary | ICD-10-CM | POA: Diagnosis not present

## 2015-07-15 DIAGNOSIS — E119 Type 2 diabetes mellitus without complications: Secondary | ICD-10-CM | POA: Diagnosis not present

## 2015-07-15 DIAGNOSIS — M109 Gout, unspecified: Secondary | ICD-10-CM | POA: Diagnosis not present

## 2015-07-20 DIAGNOSIS — Z7984 Long term (current) use of oral hypoglycemic drugs: Secondary | ICD-10-CM | POA: Diagnosis not present

## 2015-07-20 DIAGNOSIS — E119 Type 2 diabetes mellitus without complications: Secondary | ICD-10-CM | POA: Diagnosis not present

## 2015-07-20 DIAGNOSIS — R296 Repeated falls: Secondary | ICD-10-CM | POA: Diagnosis not present

## 2015-07-20 DIAGNOSIS — M6281 Muscle weakness (generalized): Secondary | ICD-10-CM | POA: Diagnosis not present

## 2015-07-20 DIAGNOSIS — M109 Gout, unspecified: Secondary | ICD-10-CM | POA: Diagnosis not present

## 2015-07-20 DIAGNOSIS — I10 Essential (primary) hypertension: Secondary | ICD-10-CM | POA: Diagnosis not present

## 2015-07-21 DIAGNOSIS — E119 Type 2 diabetes mellitus without complications: Secondary | ICD-10-CM | POA: Diagnosis not present

## 2015-07-21 DIAGNOSIS — Z7984 Long term (current) use of oral hypoglycemic drugs: Secondary | ICD-10-CM | POA: Diagnosis not present

## 2015-07-21 DIAGNOSIS — R296 Repeated falls: Secondary | ICD-10-CM | POA: Diagnosis not present

## 2015-07-21 DIAGNOSIS — M109 Gout, unspecified: Secondary | ICD-10-CM | POA: Diagnosis not present

## 2015-07-21 DIAGNOSIS — M6281 Muscle weakness (generalized): Secondary | ICD-10-CM | POA: Diagnosis not present

## 2015-07-21 DIAGNOSIS — I10 Essential (primary) hypertension: Secondary | ICD-10-CM | POA: Diagnosis not present

## 2015-07-22 DIAGNOSIS — Z7984 Long term (current) use of oral hypoglycemic drugs: Secondary | ICD-10-CM | POA: Diagnosis not present

## 2015-07-22 DIAGNOSIS — E119 Type 2 diabetes mellitus without complications: Secondary | ICD-10-CM | POA: Diagnosis not present

## 2015-07-22 DIAGNOSIS — M109 Gout, unspecified: Secondary | ICD-10-CM | POA: Diagnosis not present

## 2015-07-22 DIAGNOSIS — R296 Repeated falls: Secondary | ICD-10-CM | POA: Diagnosis not present

## 2015-07-22 DIAGNOSIS — I10 Essential (primary) hypertension: Secondary | ICD-10-CM | POA: Diagnosis not present

## 2015-07-22 DIAGNOSIS — M6281 Muscle weakness (generalized): Secondary | ICD-10-CM | POA: Diagnosis not present

## 2015-07-26 DIAGNOSIS — E119 Type 2 diabetes mellitus without complications: Secondary | ICD-10-CM | POA: Diagnosis not present

## 2015-07-26 DIAGNOSIS — Z7984 Long term (current) use of oral hypoglycemic drugs: Secondary | ICD-10-CM | POA: Diagnosis not present

## 2015-07-26 DIAGNOSIS — R296 Repeated falls: Secondary | ICD-10-CM | POA: Diagnosis not present

## 2015-07-26 DIAGNOSIS — I10 Essential (primary) hypertension: Secondary | ICD-10-CM | POA: Diagnosis not present

## 2015-07-26 DIAGNOSIS — M109 Gout, unspecified: Secondary | ICD-10-CM | POA: Diagnosis not present

## 2015-07-26 DIAGNOSIS — M6281 Muscle weakness (generalized): Secondary | ICD-10-CM | POA: Diagnosis not present

## 2015-07-28 DIAGNOSIS — Z7984 Long term (current) use of oral hypoglycemic drugs: Secondary | ICD-10-CM | POA: Diagnosis not present

## 2015-07-28 DIAGNOSIS — R296 Repeated falls: Secondary | ICD-10-CM | POA: Diagnosis not present

## 2015-07-28 DIAGNOSIS — I10 Essential (primary) hypertension: Secondary | ICD-10-CM | POA: Diagnosis not present

## 2015-07-28 DIAGNOSIS — M6281 Muscle weakness (generalized): Secondary | ICD-10-CM | POA: Diagnosis not present

## 2015-07-28 DIAGNOSIS — E119 Type 2 diabetes mellitus without complications: Secondary | ICD-10-CM | POA: Diagnosis not present

## 2015-07-28 DIAGNOSIS — M109 Gout, unspecified: Secondary | ICD-10-CM | POA: Diagnosis not present

## 2015-07-29 DIAGNOSIS — M6281 Muscle weakness (generalized): Secondary | ICD-10-CM | POA: Diagnosis not present

## 2015-07-29 DIAGNOSIS — Z7984 Long term (current) use of oral hypoglycemic drugs: Secondary | ICD-10-CM | POA: Diagnosis not present

## 2015-07-29 DIAGNOSIS — E119 Type 2 diabetes mellitus without complications: Secondary | ICD-10-CM | POA: Diagnosis not present

## 2015-07-29 DIAGNOSIS — M109 Gout, unspecified: Secondary | ICD-10-CM | POA: Diagnosis not present

## 2015-07-29 DIAGNOSIS — I10 Essential (primary) hypertension: Secondary | ICD-10-CM | POA: Diagnosis not present

## 2015-07-29 DIAGNOSIS — R296 Repeated falls: Secondary | ICD-10-CM | POA: Diagnosis not present

## 2015-07-30 DIAGNOSIS — Z7984 Long term (current) use of oral hypoglycemic drugs: Secondary | ICD-10-CM | POA: Diagnosis not present

## 2015-07-30 DIAGNOSIS — R296 Repeated falls: Secondary | ICD-10-CM | POA: Diagnosis not present

## 2015-07-30 DIAGNOSIS — E119 Type 2 diabetes mellitus without complications: Secondary | ICD-10-CM | POA: Diagnosis not present

## 2015-07-30 DIAGNOSIS — M109 Gout, unspecified: Secondary | ICD-10-CM | POA: Diagnosis not present

## 2015-07-30 DIAGNOSIS — I10 Essential (primary) hypertension: Secondary | ICD-10-CM | POA: Diagnosis not present

## 2015-07-30 DIAGNOSIS — M6281 Muscle weakness (generalized): Secondary | ICD-10-CM | POA: Diagnosis not present

## 2015-08-02 DIAGNOSIS — D72829 Elevated white blood cell count, unspecified: Secondary | ICD-10-CM | POA: Diagnosis not present

## 2015-08-02 DIAGNOSIS — E119 Type 2 diabetes mellitus without complications: Secondary | ICD-10-CM | POA: Diagnosis not present

## 2015-08-02 DIAGNOSIS — R296 Repeated falls: Secondary | ICD-10-CM | POA: Diagnosis not present

## 2015-08-02 DIAGNOSIS — M6281 Muscle weakness (generalized): Secondary | ICD-10-CM | POA: Diagnosis not present

## 2015-08-02 DIAGNOSIS — M109 Gout, unspecified: Secondary | ICD-10-CM | POA: Diagnosis not present

## 2015-08-02 DIAGNOSIS — Z7984 Long term (current) use of oral hypoglycemic drugs: Secondary | ICD-10-CM | POA: Diagnosis not present

## 2015-08-02 DIAGNOSIS — I10 Essential (primary) hypertension: Secondary | ICD-10-CM | POA: Diagnosis not present

## 2015-08-03 DIAGNOSIS — M109 Gout, unspecified: Secondary | ICD-10-CM | POA: Diagnosis not present

## 2015-08-03 DIAGNOSIS — E119 Type 2 diabetes mellitus without complications: Secondary | ICD-10-CM | POA: Diagnosis not present

## 2015-08-03 DIAGNOSIS — R296 Repeated falls: Secondary | ICD-10-CM | POA: Diagnosis not present

## 2015-08-03 DIAGNOSIS — Z7984 Long term (current) use of oral hypoglycemic drugs: Secondary | ICD-10-CM | POA: Diagnosis not present

## 2015-08-03 DIAGNOSIS — M6281 Muscle weakness (generalized): Secondary | ICD-10-CM | POA: Diagnosis not present

## 2015-08-03 DIAGNOSIS — I10 Essential (primary) hypertension: Secondary | ICD-10-CM | POA: Diagnosis not present

## 2015-08-05 DIAGNOSIS — M6281 Muscle weakness (generalized): Secondary | ICD-10-CM | POA: Diagnosis not present

## 2015-08-05 DIAGNOSIS — M109 Gout, unspecified: Secondary | ICD-10-CM | POA: Diagnosis not present

## 2015-08-05 DIAGNOSIS — R296 Repeated falls: Secondary | ICD-10-CM | POA: Diagnosis not present

## 2015-08-05 DIAGNOSIS — Z7984 Long term (current) use of oral hypoglycemic drugs: Secondary | ICD-10-CM | POA: Diagnosis not present

## 2015-08-05 DIAGNOSIS — I10 Essential (primary) hypertension: Secondary | ICD-10-CM | POA: Diagnosis not present

## 2015-08-05 DIAGNOSIS — E119 Type 2 diabetes mellitus without complications: Secondary | ICD-10-CM | POA: Diagnosis not present

## 2015-08-06 DIAGNOSIS — M6281 Muscle weakness (generalized): Secondary | ICD-10-CM | POA: Diagnosis not present

## 2015-08-06 DIAGNOSIS — M109 Gout, unspecified: Secondary | ICD-10-CM | POA: Diagnosis not present

## 2015-08-06 DIAGNOSIS — I10 Essential (primary) hypertension: Secondary | ICD-10-CM | POA: Diagnosis not present

## 2015-08-06 DIAGNOSIS — R296 Repeated falls: Secondary | ICD-10-CM | POA: Diagnosis not present

## 2015-08-06 DIAGNOSIS — E119 Type 2 diabetes mellitus without complications: Secondary | ICD-10-CM | POA: Diagnosis not present

## 2015-08-06 DIAGNOSIS — Z7984 Long term (current) use of oral hypoglycemic drugs: Secondary | ICD-10-CM | POA: Diagnosis not present

## 2015-08-07 DIAGNOSIS — M6281 Muscle weakness (generalized): Secondary | ICD-10-CM | POA: Diagnosis not present

## 2015-08-07 DIAGNOSIS — I10 Essential (primary) hypertension: Secondary | ICD-10-CM | POA: Diagnosis not present

## 2015-08-07 DIAGNOSIS — E119 Type 2 diabetes mellitus without complications: Secondary | ICD-10-CM | POA: Diagnosis not present

## 2015-08-07 DIAGNOSIS — Z7984 Long term (current) use of oral hypoglycemic drugs: Secondary | ICD-10-CM | POA: Diagnosis not present

## 2015-08-07 DIAGNOSIS — R296 Repeated falls: Secondary | ICD-10-CM | POA: Diagnosis not present

## 2015-08-07 DIAGNOSIS — M109 Gout, unspecified: Secondary | ICD-10-CM | POA: Diagnosis not present

## 2015-08-09 DIAGNOSIS — I10 Essential (primary) hypertension: Secondary | ICD-10-CM | POA: Diagnosis not present

## 2015-08-09 DIAGNOSIS — M6281 Muscle weakness (generalized): Secondary | ICD-10-CM | POA: Diagnosis not present

## 2015-08-09 DIAGNOSIS — Z7984 Long term (current) use of oral hypoglycemic drugs: Secondary | ICD-10-CM | POA: Diagnosis not present

## 2015-08-09 DIAGNOSIS — E119 Type 2 diabetes mellitus without complications: Secondary | ICD-10-CM | POA: Diagnosis not present

## 2015-08-09 DIAGNOSIS — M109 Gout, unspecified: Secondary | ICD-10-CM | POA: Diagnosis not present

## 2015-08-09 DIAGNOSIS — R296 Repeated falls: Secondary | ICD-10-CM | POA: Diagnosis not present

## 2015-08-10 DIAGNOSIS — I10 Essential (primary) hypertension: Secondary | ICD-10-CM | POA: Diagnosis not present

## 2015-08-10 DIAGNOSIS — R296 Repeated falls: Secondary | ICD-10-CM | POA: Diagnosis not present

## 2015-08-10 DIAGNOSIS — M6281 Muscle weakness (generalized): Secondary | ICD-10-CM | POA: Diagnosis not present

## 2015-08-10 DIAGNOSIS — E119 Type 2 diabetes mellitus without complications: Secondary | ICD-10-CM | POA: Diagnosis not present

## 2015-08-10 DIAGNOSIS — M109 Gout, unspecified: Secondary | ICD-10-CM | POA: Diagnosis not present

## 2015-08-10 DIAGNOSIS — M1990 Unspecified osteoarthritis, unspecified site: Secondary | ICD-10-CM | POA: Diagnosis not present

## 2015-08-10 DIAGNOSIS — Z7984 Long term (current) use of oral hypoglycemic drugs: Secondary | ICD-10-CM | POA: Diagnosis not present

## 2015-08-11 DIAGNOSIS — E119 Type 2 diabetes mellitus without complications: Secondary | ICD-10-CM | POA: Diagnosis not present

## 2015-08-11 DIAGNOSIS — R296 Repeated falls: Secondary | ICD-10-CM | POA: Diagnosis not present

## 2015-08-11 DIAGNOSIS — M6281 Muscle weakness (generalized): Secondary | ICD-10-CM | POA: Diagnosis not present

## 2015-08-11 DIAGNOSIS — M109 Gout, unspecified: Secondary | ICD-10-CM | POA: Diagnosis not present

## 2015-08-11 DIAGNOSIS — I10 Essential (primary) hypertension: Secondary | ICD-10-CM | POA: Diagnosis not present

## 2015-08-11 DIAGNOSIS — Z7984 Long term (current) use of oral hypoglycemic drugs: Secondary | ICD-10-CM | POA: Diagnosis not present

## 2015-08-12 DIAGNOSIS — Z7984 Long term (current) use of oral hypoglycemic drugs: Secondary | ICD-10-CM | POA: Diagnosis not present

## 2015-08-12 DIAGNOSIS — I10 Essential (primary) hypertension: Secondary | ICD-10-CM | POA: Diagnosis not present

## 2015-08-12 DIAGNOSIS — E119 Type 2 diabetes mellitus without complications: Secondary | ICD-10-CM | POA: Diagnosis not present

## 2015-08-12 DIAGNOSIS — R296 Repeated falls: Secondary | ICD-10-CM | POA: Diagnosis not present

## 2015-08-12 DIAGNOSIS — M6281 Muscle weakness (generalized): Secondary | ICD-10-CM | POA: Diagnosis not present

## 2015-08-12 DIAGNOSIS — M109 Gout, unspecified: Secondary | ICD-10-CM | POA: Diagnosis not present

## 2015-08-16 DIAGNOSIS — M6281 Muscle weakness (generalized): Secondary | ICD-10-CM | POA: Diagnosis not present

## 2015-08-16 DIAGNOSIS — R296 Repeated falls: Secondary | ICD-10-CM | POA: Diagnosis not present

## 2015-08-16 DIAGNOSIS — Z7984 Long term (current) use of oral hypoglycemic drugs: Secondary | ICD-10-CM | POA: Diagnosis not present

## 2015-08-16 DIAGNOSIS — I10 Essential (primary) hypertension: Secondary | ICD-10-CM | POA: Diagnosis not present

## 2015-08-16 DIAGNOSIS — M109 Gout, unspecified: Secondary | ICD-10-CM | POA: Diagnosis not present

## 2015-08-16 DIAGNOSIS — E119 Type 2 diabetes mellitus without complications: Secondary | ICD-10-CM | POA: Diagnosis not present

## 2015-08-17 DIAGNOSIS — M109 Gout, unspecified: Secondary | ICD-10-CM | POA: Diagnosis not present

## 2015-08-17 DIAGNOSIS — E119 Type 2 diabetes mellitus without complications: Secondary | ICD-10-CM | POA: Diagnosis not present

## 2015-08-17 DIAGNOSIS — M6281 Muscle weakness (generalized): Secondary | ICD-10-CM | POA: Diagnosis not present

## 2015-08-17 DIAGNOSIS — I10 Essential (primary) hypertension: Secondary | ICD-10-CM | POA: Diagnosis not present

## 2015-08-17 DIAGNOSIS — R296 Repeated falls: Secondary | ICD-10-CM | POA: Diagnosis not present

## 2015-08-17 DIAGNOSIS — Z7984 Long term (current) use of oral hypoglycemic drugs: Secondary | ICD-10-CM | POA: Diagnosis not present

## 2015-08-18 DIAGNOSIS — E119 Type 2 diabetes mellitus without complications: Secondary | ICD-10-CM | POA: Diagnosis not present

## 2015-08-18 DIAGNOSIS — I10 Essential (primary) hypertension: Secondary | ICD-10-CM | POA: Diagnosis not present

## 2015-08-18 DIAGNOSIS — M109 Gout, unspecified: Secondary | ICD-10-CM | POA: Diagnosis not present

## 2015-08-18 DIAGNOSIS — Z7984 Long term (current) use of oral hypoglycemic drugs: Secondary | ICD-10-CM | POA: Diagnosis not present

## 2015-08-18 DIAGNOSIS — R296 Repeated falls: Secondary | ICD-10-CM | POA: Diagnosis not present

## 2015-08-18 DIAGNOSIS — M6281 Muscle weakness (generalized): Secondary | ICD-10-CM | POA: Diagnosis not present

## 2015-08-19 DIAGNOSIS — M109 Gout, unspecified: Secondary | ICD-10-CM | POA: Diagnosis not present

## 2015-08-19 DIAGNOSIS — M79672 Pain in left foot: Secondary | ICD-10-CM | POA: Diagnosis not present

## 2015-08-19 DIAGNOSIS — E119 Type 2 diabetes mellitus without complications: Secondary | ICD-10-CM | POA: Diagnosis not present

## 2015-08-19 DIAGNOSIS — Z7984 Long term (current) use of oral hypoglycemic drugs: Secondary | ICD-10-CM | POA: Diagnosis not present

## 2015-08-19 DIAGNOSIS — I10 Essential (primary) hypertension: Secondary | ICD-10-CM | POA: Diagnosis not present

## 2015-08-19 DIAGNOSIS — L03032 Cellulitis of left toe: Secondary | ICD-10-CM | POA: Diagnosis not present

## 2015-08-19 DIAGNOSIS — R296 Repeated falls: Secondary | ICD-10-CM | POA: Diagnosis not present

## 2015-08-19 DIAGNOSIS — M79671 Pain in right foot: Secondary | ICD-10-CM | POA: Diagnosis not present

## 2015-08-19 DIAGNOSIS — M6281 Muscle weakness (generalized): Secondary | ICD-10-CM | POA: Diagnosis not present

## 2015-08-20 DIAGNOSIS — M6281 Muscle weakness (generalized): Secondary | ICD-10-CM | POA: Diagnosis not present

## 2015-08-20 DIAGNOSIS — E119 Type 2 diabetes mellitus without complications: Secondary | ICD-10-CM | POA: Diagnosis not present

## 2015-08-20 DIAGNOSIS — R296 Repeated falls: Secondary | ICD-10-CM | POA: Diagnosis not present

## 2015-08-20 DIAGNOSIS — Z7984 Long term (current) use of oral hypoglycemic drugs: Secondary | ICD-10-CM | POA: Diagnosis not present

## 2015-08-20 DIAGNOSIS — M109 Gout, unspecified: Secondary | ICD-10-CM | POA: Diagnosis not present

## 2015-08-20 DIAGNOSIS — I10 Essential (primary) hypertension: Secondary | ICD-10-CM | POA: Diagnosis not present

## 2015-08-23 DIAGNOSIS — Z7984 Long term (current) use of oral hypoglycemic drugs: Secondary | ICD-10-CM | POA: Diagnosis not present

## 2015-08-23 DIAGNOSIS — M109 Gout, unspecified: Secondary | ICD-10-CM | POA: Diagnosis not present

## 2015-08-23 DIAGNOSIS — R296 Repeated falls: Secondary | ICD-10-CM | POA: Diagnosis not present

## 2015-08-23 DIAGNOSIS — M6281 Muscle weakness (generalized): Secondary | ICD-10-CM | POA: Diagnosis not present

## 2015-08-23 DIAGNOSIS — I10 Essential (primary) hypertension: Secondary | ICD-10-CM | POA: Diagnosis not present

## 2015-08-23 DIAGNOSIS — E119 Type 2 diabetes mellitus without complications: Secondary | ICD-10-CM | POA: Diagnosis not present

## 2015-08-25 DIAGNOSIS — R296 Repeated falls: Secondary | ICD-10-CM | POA: Diagnosis not present

## 2015-08-25 DIAGNOSIS — M109 Gout, unspecified: Secondary | ICD-10-CM | POA: Diagnosis not present

## 2015-08-25 DIAGNOSIS — E119 Type 2 diabetes mellitus without complications: Secondary | ICD-10-CM | POA: Diagnosis not present

## 2015-08-25 DIAGNOSIS — I10 Essential (primary) hypertension: Secondary | ICD-10-CM | POA: Diagnosis not present

## 2015-08-25 DIAGNOSIS — Z7984 Long term (current) use of oral hypoglycemic drugs: Secondary | ICD-10-CM | POA: Diagnosis not present

## 2015-08-25 DIAGNOSIS — M6281 Muscle weakness (generalized): Secondary | ICD-10-CM | POA: Diagnosis not present

## 2015-08-26 DIAGNOSIS — E119 Type 2 diabetes mellitus without complications: Secondary | ICD-10-CM | POA: Diagnosis not present

## 2015-08-26 DIAGNOSIS — M6281 Muscle weakness (generalized): Secondary | ICD-10-CM | POA: Diagnosis not present

## 2015-08-26 DIAGNOSIS — I10 Essential (primary) hypertension: Secondary | ICD-10-CM | POA: Diagnosis not present

## 2015-08-26 DIAGNOSIS — Z7984 Long term (current) use of oral hypoglycemic drugs: Secondary | ICD-10-CM | POA: Diagnosis not present

## 2015-08-26 DIAGNOSIS — R296 Repeated falls: Secondary | ICD-10-CM | POA: Diagnosis not present

## 2015-08-26 DIAGNOSIS — M109 Gout, unspecified: Secondary | ICD-10-CM | POA: Diagnosis not present

## 2015-08-29 DIAGNOSIS — Z48817 Encounter for surgical aftercare following surgery on the skin and subcutaneous tissue: Secondary | ICD-10-CM | POA: Diagnosis not present

## 2015-08-29 DIAGNOSIS — Z7984 Long term (current) use of oral hypoglycemic drugs: Secondary | ICD-10-CM | POA: Diagnosis not present

## 2015-08-29 DIAGNOSIS — Z7982 Long term (current) use of aspirin: Secondary | ICD-10-CM | POA: Diagnosis not present

## 2015-08-29 DIAGNOSIS — M6281 Muscle weakness (generalized): Secondary | ICD-10-CM | POA: Diagnosis not present

## 2015-08-29 DIAGNOSIS — Z9181 History of falling: Secondary | ICD-10-CM | POA: Diagnosis not present

## 2015-08-29 DIAGNOSIS — Z9981 Dependence on supplemental oxygen: Secondary | ICD-10-CM | POA: Diagnosis not present

## 2015-08-29 DIAGNOSIS — M109 Gout, unspecified: Secondary | ICD-10-CM | POA: Diagnosis not present

## 2015-08-29 DIAGNOSIS — E119 Type 2 diabetes mellitus without complications: Secondary | ICD-10-CM | POA: Diagnosis not present

## 2015-08-29 DIAGNOSIS — Z87891 Personal history of nicotine dependence: Secondary | ICD-10-CM | POA: Diagnosis not present

## 2015-08-29 DIAGNOSIS — I10 Essential (primary) hypertension: Secondary | ICD-10-CM | POA: Diagnosis not present

## 2015-08-29 DIAGNOSIS — Z96653 Presence of artificial knee joint, bilateral: Secondary | ICD-10-CM | POA: Diagnosis not present

## 2015-08-30 DIAGNOSIS — Z48817 Encounter for surgical aftercare following surgery on the skin and subcutaneous tissue: Secondary | ICD-10-CM | POA: Diagnosis not present

## 2015-08-30 DIAGNOSIS — Z9981 Dependence on supplemental oxygen: Secondary | ICD-10-CM | POA: Diagnosis not present

## 2015-08-30 DIAGNOSIS — I10 Essential (primary) hypertension: Secondary | ICD-10-CM | POA: Diagnosis not present

## 2015-08-30 DIAGNOSIS — M109 Gout, unspecified: Secondary | ICD-10-CM | POA: Diagnosis not present

## 2015-08-30 DIAGNOSIS — E119 Type 2 diabetes mellitus without complications: Secondary | ICD-10-CM | POA: Diagnosis not present

## 2015-08-30 DIAGNOSIS — M6281 Muscle weakness (generalized): Secondary | ICD-10-CM | POA: Diagnosis not present

## 2015-09-01 DIAGNOSIS — Z9981 Dependence on supplemental oxygen: Secondary | ICD-10-CM | POA: Diagnosis not present

## 2015-09-01 DIAGNOSIS — M109 Gout, unspecified: Secondary | ICD-10-CM | POA: Diagnosis not present

## 2015-09-01 DIAGNOSIS — I10 Essential (primary) hypertension: Secondary | ICD-10-CM | POA: Diagnosis not present

## 2015-09-01 DIAGNOSIS — Z48817 Encounter for surgical aftercare following surgery on the skin and subcutaneous tissue: Secondary | ICD-10-CM | POA: Diagnosis not present

## 2015-09-01 DIAGNOSIS — M6281 Muscle weakness (generalized): Secondary | ICD-10-CM | POA: Diagnosis not present

## 2015-09-01 DIAGNOSIS — E119 Type 2 diabetes mellitus without complications: Secondary | ICD-10-CM | POA: Diagnosis not present

## 2015-09-02 DIAGNOSIS — E119 Type 2 diabetes mellitus without complications: Secondary | ICD-10-CM | POA: Diagnosis not present

## 2015-09-02 DIAGNOSIS — Z9981 Dependence on supplemental oxygen: Secondary | ICD-10-CM | POA: Diagnosis not present

## 2015-09-02 DIAGNOSIS — M6281 Muscle weakness (generalized): Secondary | ICD-10-CM | POA: Diagnosis not present

## 2015-09-02 DIAGNOSIS — Z48817 Encounter for surgical aftercare following surgery on the skin and subcutaneous tissue: Secondary | ICD-10-CM | POA: Diagnosis not present

## 2015-09-02 DIAGNOSIS — I10 Essential (primary) hypertension: Secondary | ICD-10-CM | POA: Diagnosis not present

## 2015-09-02 DIAGNOSIS — M109 Gout, unspecified: Secondary | ICD-10-CM | POA: Diagnosis not present

## 2015-09-03 DIAGNOSIS — T8189XA Other complications of procedures, not elsewhere classified, initial encounter: Secondary | ICD-10-CM | POA: Diagnosis not present

## 2015-09-06 DIAGNOSIS — I10 Essential (primary) hypertension: Secondary | ICD-10-CM | POA: Diagnosis not present

## 2015-09-06 DIAGNOSIS — E119 Type 2 diabetes mellitus without complications: Secondary | ICD-10-CM | POA: Diagnosis not present

## 2015-09-06 DIAGNOSIS — Z9981 Dependence on supplemental oxygen: Secondary | ICD-10-CM | POA: Diagnosis not present

## 2015-09-06 DIAGNOSIS — M109 Gout, unspecified: Secondary | ICD-10-CM | POA: Diagnosis not present

## 2015-09-06 DIAGNOSIS — Z48817 Encounter for surgical aftercare following surgery on the skin and subcutaneous tissue: Secondary | ICD-10-CM | POA: Diagnosis not present

## 2015-09-06 DIAGNOSIS — M6281 Muscle weakness (generalized): Secondary | ICD-10-CM | POA: Diagnosis not present

## 2015-09-07 DIAGNOSIS — Z9981 Dependence on supplemental oxygen: Secondary | ICD-10-CM | POA: Diagnosis not present

## 2015-09-07 DIAGNOSIS — I10 Essential (primary) hypertension: Secondary | ICD-10-CM | POA: Diagnosis not present

## 2015-09-07 DIAGNOSIS — M6281 Muscle weakness (generalized): Secondary | ICD-10-CM | POA: Diagnosis not present

## 2015-09-07 DIAGNOSIS — G4733 Obstructive sleep apnea (adult) (pediatric): Secondary | ICD-10-CM | POA: Diagnosis not present

## 2015-09-07 DIAGNOSIS — Z48817 Encounter for surgical aftercare following surgery on the skin and subcutaneous tissue: Secondary | ICD-10-CM | POA: Diagnosis not present

## 2015-09-07 DIAGNOSIS — E119 Type 2 diabetes mellitus without complications: Secondary | ICD-10-CM | POA: Diagnosis not present

## 2015-09-07 DIAGNOSIS — M109 Gout, unspecified: Secondary | ICD-10-CM | POA: Diagnosis not present

## 2015-09-09 DIAGNOSIS — Z48817 Encounter for surgical aftercare following surgery on the skin and subcutaneous tissue: Secondary | ICD-10-CM | POA: Diagnosis not present

## 2015-09-09 DIAGNOSIS — E119 Type 2 diabetes mellitus without complications: Secondary | ICD-10-CM | POA: Diagnosis not present

## 2015-09-09 DIAGNOSIS — M109 Gout, unspecified: Secondary | ICD-10-CM | POA: Diagnosis not present

## 2015-09-09 DIAGNOSIS — M6281 Muscle weakness (generalized): Secondary | ICD-10-CM | POA: Diagnosis not present

## 2015-09-09 DIAGNOSIS — I10 Essential (primary) hypertension: Secondary | ICD-10-CM | POA: Diagnosis not present

## 2015-09-09 DIAGNOSIS — Z9981 Dependence on supplemental oxygen: Secondary | ICD-10-CM | POA: Diagnosis not present

## 2015-09-10 DIAGNOSIS — I10 Essential (primary) hypertension: Secondary | ICD-10-CM | POA: Diagnosis not present

## 2015-09-10 DIAGNOSIS — E119 Type 2 diabetes mellitus without complications: Secondary | ICD-10-CM | POA: Diagnosis not present

## 2015-09-10 DIAGNOSIS — M6281 Muscle weakness (generalized): Secondary | ICD-10-CM | POA: Diagnosis not present

## 2015-09-10 DIAGNOSIS — Z9981 Dependence on supplemental oxygen: Secondary | ICD-10-CM | POA: Diagnosis not present

## 2015-09-10 DIAGNOSIS — M109 Gout, unspecified: Secondary | ICD-10-CM | POA: Diagnosis not present

## 2015-09-10 DIAGNOSIS — Z48817 Encounter for surgical aftercare following surgery on the skin and subcutaneous tissue: Secondary | ICD-10-CM | POA: Diagnosis not present

## 2015-09-14 DIAGNOSIS — I10 Essential (primary) hypertension: Secondary | ICD-10-CM | POA: Diagnosis not present

## 2015-09-14 DIAGNOSIS — E119 Type 2 diabetes mellitus without complications: Secondary | ICD-10-CM | POA: Diagnosis not present

## 2015-09-14 DIAGNOSIS — M6281 Muscle weakness (generalized): Secondary | ICD-10-CM | POA: Diagnosis not present

## 2015-09-14 DIAGNOSIS — Z48817 Encounter for surgical aftercare following surgery on the skin and subcutaneous tissue: Secondary | ICD-10-CM | POA: Diagnosis not present

## 2015-09-14 DIAGNOSIS — Z9981 Dependence on supplemental oxygen: Secondary | ICD-10-CM | POA: Diagnosis not present

## 2015-09-14 DIAGNOSIS — M109 Gout, unspecified: Secondary | ICD-10-CM | POA: Diagnosis not present

## 2015-09-16 DIAGNOSIS — M109 Gout, unspecified: Secondary | ICD-10-CM | POA: Diagnosis not present

## 2015-09-16 DIAGNOSIS — I10 Essential (primary) hypertension: Secondary | ICD-10-CM | POA: Diagnosis not present

## 2015-09-16 DIAGNOSIS — E119 Type 2 diabetes mellitus without complications: Secondary | ICD-10-CM | POA: Diagnosis not present

## 2015-09-16 DIAGNOSIS — Z48817 Encounter for surgical aftercare following surgery on the skin and subcutaneous tissue: Secondary | ICD-10-CM | POA: Diagnosis not present

## 2015-09-16 DIAGNOSIS — M6281 Muscle weakness (generalized): Secondary | ICD-10-CM | POA: Diagnosis not present

## 2015-09-16 DIAGNOSIS — Z9981 Dependence on supplemental oxygen: Secondary | ICD-10-CM | POA: Diagnosis not present

## 2015-09-17 DIAGNOSIS — Z48817 Encounter for surgical aftercare following surgery on the skin and subcutaneous tissue: Secondary | ICD-10-CM | POA: Diagnosis not present

## 2015-09-17 DIAGNOSIS — M6281 Muscle weakness (generalized): Secondary | ICD-10-CM | POA: Diagnosis not present

## 2015-09-17 DIAGNOSIS — M109 Gout, unspecified: Secondary | ICD-10-CM | POA: Diagnosis not present

## 2015-09-17 DIAGNOSIS — I10 Essential (primary) hypertension: Secondary | ICD-10-CM | POA: Diagnosis not present

## 2015-09-17 DIAGNOSIS — Z9981 Dependence on supplemental oxygen: Secondary | ICD-10-CM | POA: Diagnosis not present

## 2015-09-17 DIAGNOSIS — E119 Type 2 diabetes mellitus without complications: Secondary | ICD-10-CM | POA: Diagnosis not present

## 2015-09-21 DIAGNOSIS — Z48817 Encounter for surgical aftercare following surgery on the skin and subcutaneous tissue: Secondary | ICD-10-CM | POA: Diagnosis not present

## 2015-09-21 DIAGNOSIS — M6281 Muscle weakness (generalized): Secondary | ICD-10-CM | POA: Diagnosis not present

## 2015-09-21 DIAGNOSIS — M109 Gout, unspecified: Secondary | ICD-10-CM | POA: Diagnosis not present

## 2015-09-21 DIAGNOSIS — Z9981 Dependence on supplemental oxygen: Secondary | ICD-10-CM | POA: Diagnosis not present

## 2015-09-21 DIAGNOSIS — E119 Type 2 diabetes mellitus without complications: Secondary | ICD-10-CM | POA: Diagnosis not present

## 2015-09-21 DIAGNOSIS — I10 Essential (primary) hypertension: Secondary | ICD-10-CM | POA: Diagnosis not present

## 2015-09-22 DIAGNOSIS — M6281 Muscle weakness (generalized): Secondary | ICD-10-CM | POA: Diagnosis not present

## 2015-09-22 DIAGNOSIS — M109 Gout, unspecified: Secondary | ICD-10-CM | POA: Diagnosis not present

## 2015-09-22 DIAGNOSIS — Z9981 Dependence on supplemental oxygen: Secondary | ICD-10-CM | POA: Diagnosis not present

## 2015-09-22 DIAGNOSIS — I10 Essential (primary) hypertension: Secondary | ICD-10-CM | POA: Diagnosis not present

## 2015-09-22 DIAGNOSIS — E119 Type 2 diabetes mellitus without complications: Secondary | ICD-10-CM | POA: Diagnosis not present

## 2015-09-22 DIAGNOSIS — Z48817 Encounter for surgical aftercare following surgery on the skin and subcutaneous tissue: Secondary | ICD-10-CM | POA: Diagnosis not present

## 2015-09-23 DIAGNOSIS — M6281 Muscle weakness (generalized): Secondary | ICD-10-CM | POA: Diagnosis not present

## 2015-09-23 DIAGNOSIS — Z48817 Encounter for surgical aftercare following surgery on the skin and subcutaneous tissue: Secondary | ICD-10-CM | POA: Diagnosis not present

## 2015-09-23 DIAGNOSIS — Z9981 Dependence on supplemental oxygen: Secondary | ICD-10-CM | POA: Diagnosis not present

## 2015-09-23 DIAGNOSIS — E119 Type 2 diabetes mellitus without complications: Secondary | ICD-10-CM | POA: Diagnosis not present

## 2015-09-23 DIAGNOSIS — M109 Gout, unspecified: Secondary | ICD-10-CM | POA: Diagnosis not present

## 2015-09-23 DIAGNOSIS — I10 Essential (primary) hypertension: Secondary | ICD-10-CM | POA: Diagnosis not present

## 2015-09-24 DIAGNOSIS — M109 Gout, unspecified: Secondary | ICD-10-CM | POA: Diagnosis not present

## 2015-09-24 DIAGNOSIS — Z9981 Dependence on supplemental oxygen: Secondary | ICD-10-CM | POA: Diagnosis not present

## 2015-09-24 DIAGNOSIS — E119 Type 2 diabetes mellitus without complications: Secondary | ICD-10-CM | POA: Diagnosis not present

## 2015-09-24 DIAGNOSIS — M6281 Muscle weakness (generalized): Secondary | ICD-10-CM | POA: Diagnosis not present

## 2015-09-24 DIAGNOSIS — I10 Essential (primary) hypertension: Secondary | ICD-10-CM | POA: Diagnosis not present

## 2015-09-24 DIAGNOSIS — Z48817 Encounter for surgical aftercare following surgery on the skin and subcutaneous tissue: Secondary | ICD-10-CM | POA: Diagnosis not present

## 2015-09-28 DIAGNOSIS — M109 Gout, unspecified: Secondary | ICD-10-CM | POA: Diagnosis not present

## 2015-09-28 DIAGNOSIS — Z9981 Dependence on supplemental oxygen: Secondary | ICD-10-CM | POA: Diagnosis not present

## 2015-09-28 DIAGNOSIS — E119 Type 2 diabetes mellitus without complications: Secondary | ICD-10-CM | POA: Diagnosis not present

## 2015-09-28 DIAGNOSIS — Z48817 Encounter for surgical aftercare following surgery on the skin and subcutaneous tissue: Secondary | ICD-10-CM | POA: Diagnosis not present

## 2015-09-28 DIAGNOSIS — I10 Essential (primary) hypertension: Secondary | ICD-10-CM | POA: Diagnosis not present

## 2015-09-28 DIAGNOSIS — M6281 Muscle weakness (generalized): Secondary | ICD-10-CM | POA: Diagnosis not present

## 2015-09-29 DIAGNOSIS — E119 Type 2 diabetes mellitus without complications: Secondary | ICD-10-CM | POA: Diagnosis not present

## 2015-09-29 DIAGNOSIS — Z48817 Encounter for surgical aftercare following surgery on the skin and subcutaneous tissue: Secondary | ICD-10-CM | POA: Diagnosis not present

## 2015-09-29 DIAGNOSIS — Z9981 Dependence on supplemental oxygen: Secondary | ICD-10-CM | POA: Diagnosis not present

## 2015-09-29 DIAGNOSIS — M109 Gout, unspecified: Secondary | ICD-10-CM | POA: Diagnosis not present

## 2015-09-29 DIAGNOSIS — M6281 Muscle weakness (generalized): Secondary | ICD-10-CM | POA: Diagnosis not present

## 2015-09-29 DIAGNOSIS — I10 Essential (primary) hypertension: Secondary | ICD-10-CM | POA: Diagnosis not present

## 2015-10-01 DIAGNOSIS — M109 Gout, unspecified: Secondary | ICD-10-CM | POA: Diagnosis not present

## 2015-10-01 DIAGNOSIS — E119 Type 2 diabetes mellitus without complications: Secondary | ICD-10-CM | POA: Diagnosis not present

## 2015-10-01 DIAGNOSIS — M6281 Muscle weakness (generalized): Secondary | ICD-10-CM | POA: Diagnosis not present

## 2015-10-01 DIAGNOSIS — Z9981 Dependence on supplemental oxygen: Secondary | ICD-10-CM | POA: Diagnosis not present

## 2015-10-01 DIAGNOSIS — I10 Essential (primary) hypertension: Secondary | ICD-10-CM | POA: Diagnosis not present

## 2015-10-01 DIAGNOSIS — Z48817 Encounter for surgical aftercare following surgery on the skin and subcutaneous tissue: Secondary | ICD-10-CM | POA: Diagnosis not present

## 2015-10-05 DIAGNOSIS — Z9981 Dependence on supplemental oxygen: Secondary | ICD-10-CM | POA: Diagnosis not present

## 2015-10-05 DIAGNOSIS — Z48817 Encounter for surgical aftercare following surgery on the skin and subcutaneous tissue: Secondary | ICD-10-CM | POA: Diagnosis not present

## 2015-10-05 DIAGNOSIS — E119 Type 2 diabetes mellitus without complications: Secondary | ICD-10-CM | POA: Diagnosis not present

## 2015-10-05 DIAGNOSIS — M109 Gout, unspecified: Secondary | ICD-10-CM | POA: Diagnosis not present

## 2015-10-05 DIAGNOSIS — I10 Essential (primary) hypertension: Secondary | ICD-10-CM | POA: Diagnosis not present

## 2015-10-05 DIAGNOSIS — M6281 Muscle weakness (generalized): Secondary | ICD-10-CM | POA: Diagnosis not present

## 2015-10-08 DIAGNOSIS — M109 Gout, unspecified: Secondary | ICD-10-CM | POA: Diagnosis not present

## 2015-10-08 DIAGNOSIS — M6281 Muscle weakness (generalized): Secondary | ICD-10-CM | POA: Diagnosis not present

## 2015-10-08 DIAGNOSIS — E119 Type 2 diabetes mellitus without complications: Secondary | ICD-10-CM | POA: Diagnosis not present

## 2015-10-08 DIAGNOSIS — I10 Essential (primary) hypertension: Secondary | ICD-10-CM | POA: Diagnosis not present

## 2015-10-08 DIAGNOSIS — Z48817 Encounter for surgical aftercare following surgery on the skin and subcutaneous tissue: Secondary | ICD-10-CM | POA: Diagnosis not present

## 2015-10-08 DIAGNOSIS — Z9981 Dependence on supplemental oxygen: Secondary | ICD-10-CM | POA: Diagnosis not present

## 2015-10-11 DIAGNOSIS — M6281 Muscle weakness (generalized): Secondary | ICD-10-CM | POA: Diagnosis not present

## 2015-10-11 DIAGNOSIS — M109 Gout, unspecified: Secondary | ICD-10-CM | POA: Diagnosis not present

## 2015-10-11 DIAGNOSIS — H52203 Unspecified astigmatism, bilateral: Secondary | ICD-10-CM | POA: Diagnosis not present

## 2015-10-11 DIAGNOSIS — H04123 Dry eye syndrome of bilateral lacrimal glands: Secondary | ICD-10-CM | POA: Diagnosis not present

## 2015-10-11 DIAGNOSIS — Z9981 Dependence on supplemental oxygen: Secondary | ICD-10-CM | POA: Diagnosis not present

## 2015-10-11 DIAGNOSIS — E119 Type 2 diabetes mellitus without complications: Secondary | ICD-10-CM | POA: Diagnosis not present

## 2015-10-11 DIAGNOSIS — Z48817 Encounter for surgical aftercare following surgery on the skin and subcutaneous tissue: Secondary | ICD-10-CM | POA: Diagnosis not present

## 2015-10-11 DIAGNOSIS — I10 Essential (primary) hypertension: Secondary | ICD-10-CM | POA: Diagnosis not present

## 2015-10-11 DIAGNOSIS — D3131 Benign neoplasm of right choroid: Secondary | ICD-10-CM | POA: Diagnosis not present

## 2015-10-13 DIAGNOSIS — M109 Gout, unspecified: Secondary | ICD-10-CM | POA: Diagnosis not present

## 2015-10-13 DIAGNOSIS — E119 Type 2 diabetes mellitus without complications: Secondary | ICD-10-CM | POA: Diagnosis not present

## 2015-10-13 DIAGNOSIS — M6281 Muscle weakness (generalized): Secondary | ICD-10-CM | POA: Diagnosis not present

## 2015-10-13 DIAGNOSIS — Z9981 Dependence on supplemental oxygen: Secondary | ICD-10-CM | POA: Diagnosis not present

## 2015-10-13 DIAGNOSIS — Z48817 Encounter for surgical aftercare following surgery on the skin and subcutaneous tissue: Secondary | ICD-10-CM | POA: Diagnosis not present

## 2015-10-13 DIAGNOSIS — I10 Essential (primary) hypertension: Secondary | ICD-10-CM | POA: Diagnosis not present

## 2015-10-15 DIAGNOSIS — Z48817 Encounter for surgical aftercare following surgery on the skin and subcutaneous tissue: Secondary | ICD-10-CM | POA: Diagnosis not present

## 2015-10-15 DIAGNOSIS — E119 Type 2 diabetes mellitus without complications: Secondary | ICD-10-CM | POA: Diagnosis not present

## 2015-10-15 DIAGNOSIS — I10 Essential (primary) hypertension: Secondary | ICD-10-CM | POA: Diagnosis not present

## 2015-10-15 DIAGNOSIS — Z9981 Dependence on supplemental oxygen: Secondary | ICD-10-CM | POA: Diagnosis not present

## 2015-10-15 DIAGNOSIS — M109 Gout, unspecified: Secondary | ICD-10-CM | POA: Diagnosis not present

## 2015-10-15 DIAGNOSIS — M6281 Muscle weakness (generalized): Secondary | ICD-10-CM | POA: Diagnosis not present

## 2015-10-19 DIAGNOSIS — I10 Essential (primary) hypertension: Secondary | ICD-10-CM | POA: Diagnosis not present

## 2015-10-19 DIAGNOSIS — E119 Type 2 diabetes mellitus without complications: Secondary | ICD-10-CM | POA: Diagnosis not present

## 2015-10-19 DIAGNOSIS — M109 Gout, unspecified: Secondary | ICD-10-CM | POA: Diagnosis not present

## 2015-10-19 DIAGNOSIS — M6281 Muscle weakness (generalized): Secondary | ICD-10-CM | POA: Diagnosis not present

## 2015-10-19 DIAGNOSIS — Z9981 Dependence on supplemental oxygen: Secondary | ICD-10-CM | POA: Diagnosis not present

## 2015-10-19 DIAGNOSIS — Z48817 Encounter for surgical aftercare following surgery on the skin and subcutaneous tissue: Secondary | ICD-10-CM | POA: Diagnosis not present

## 2015-10-21 DIAGNOSIS — I10 Essential (primary) hypertension: Secondary | ICD-10-CM | POA: Diagnosis not present

## 2015-10-21 DIAGNOSIS — M109 Gout, unspecified: Secondary | ICD-10-CM | POA: Diagnosis not present

## 2015-10-21 DIAGNOSIS — E119 Type 2 diabetes mellitus without complications: Secondary | ICD-10-CM | POA: Diagnosis not present

## 2015-10-21 DIAGNOSIS — M6281 Muscle weakness (generalized): Secondary | ICD-10-CM | POA: Diagnosis not present

## 2015-10-21 DIAGNOSIS — Z9981 Dependence on supplemental oxygen: Secondary | ICD-10-CM | POA: Diagnosis not present

## 2015-10-21 DIAGNOSIS — G4733 Obstructive sleep apnea (adult) (pediatric): Secondary | ICD-10-CM | POA: Diagnosis not present

## 2015-10-21 DIAGNOSIS — Z48817 Encounter for surgical aftercare following surgery on the skin and subcutaneous tissue: Secondary | ICD-10-CM | POA: Diagnosis not present

## 2015-10-25 DIAGNOSIS — E119 Type 2 diabetes mellitus without complications: Secondary | ICD-10-CM | POA: Diagnosis not present

## 2015-10-25 DIAGNOSIS — M6281 Muscle weakness (generalized): Secondary | ICD-10-CM | POA: Diagnosis not present

## 2015-10-25 DIAGNOSIS — M109 Gout, unspecified: Secondary | ICD-10-CM | POA: Diagnosis not present

## 2015-10-25 DIAGNOSIS — I10 Essential (primary) hypertension: Secondary | ICD-10-CM | POA: Diagnosis not present

## 2015-10-25 DIAGNOSIS — Z48817 Encounter for surgical aftercare following surgery on the skin and subcutaneous tissue: Secondary | ICD-10-CM | POA: Diagnosis not present

## 2015-10-25 DIAGNOSIS — Z9981 Dependence on supplemental oxygen: Secondary | ICD-10-CM | POA: Diagnosis not present

## 2015-10-26 DIAGNOSIS — I10 Essential (primary) hypertension: Secondary | ICD-10-CM | POA: Diagnosis not present

## 2015-10-26 DIAGNOSIS — Z48817 Encounter for surgical aftercare following surgery on the skin and subcutaneous tissue: Secondary | ICD-10-CM | POA: Diagnosis not present

## 2015-10-26 DIAGNOSIS — M109 Gout, unspecified: Secondary | ICD-10-CM | POA: Diagnosis not present

## 2015-10-26 DIAGNOSIS — E119 Type 2 diabetes mellitus without complications: Secondary | ICD-10-CM | POA: Diagnosis not present

## 2015-10-26 DIAGNOSIS — M6281 Muscle weakness (generalized): Secondary | ICD-10-CM | POA: Diagnosis not present

## 2015-10-26 DIAGNOSIS — Z9981 Dependence on supplemental oxygen: Secondary | ICD-10-CM | POA: Diagnosis not present

## 2015-11-08 DIAGNOSIS — E119 Type 2 diabetes mellitus without complications: Secondary | ICD-10-CM | POA: Diagnosis not present

## 2015-11-08 DIAGNOSIS — M1990 Unspecified osteoarthritis, unspecified site: Secondary | ICD-10-CM | POA: Diagnosis not present

## 2015-11-08 DIAGNOSIS — I10 Essential (primary) hypertension: Secondary | ICD-10-CM | POA: Diagnosis not present

## 2015-11-10 DIAGNOSIS — G4733 Obstructive sleep apnea (adult) (pediatric): Secondary | ICD-10-CM | POA: Diagnosis not present

## 2015-11-10 DIAGNOSIS — Z23 Encounter for immunization: Secondary | ICD-10-CM | POA: Diagnosis not present

## 2015-11-10 DIAGNOSIS — E119 Type 2 diabetes mellitus without complications: Secondary | ICD-10-CM | POA: Diagnosis not present

## 2015-11-10 DIAGNOSIS — M109 Gout, unspecified: Secondary | ICD-10-CM | POA: Diagnosis not present

## 2015-11-10 DIAGNOSIS — E7800 Pure hypercholesterolemia, unspecified: Secondary | ICD-10-CM | POA: Diagnosis not present

## 2015-11-10 DIAGNOSIS — D649 Anemia, unspecified: Secondary | ICD-10-CM | POA: Diagnosis not present

## 2015-11-10 DIAGNOSIS — Z7984 Long term (current) use of oral hypoglycemic drugs: Secondary | ICD-10-CM | POA: Diagnosis not present

## 2015-11-10 DIAGNOSIS — I10 Essential (primary) hypertension: Secondary | ICD-10-CM | POA: Diagnosis not present

## 2015-12-01 DIAGNOSIS — I10 Essential (primary) hypertension: Secondary | ICD-10-CM | POA: Diagnosis not present

## 2015-12-01 DIAGNOSIS — E119 Type 2 diabetes mellitus without complications: Secondary | ICD-10-CM | POA: Diagnosis not present

## 2015-12-21 DIAGNOSIS — G4733 Obstructive sleep apnea (adult) (pediatric): Secondary | ICD-10-CM | POA: Diagnosis not present

## 2015-12-24 DIAGNOSIS — M6281 Muscle weakness (generalized): Secondary | ICD-10-CM | POA: Diagnosis not present

## 2015-12-24 DIAGNOSIS — M545 Low back pain: Secondary | ICD-10-CM | POA: Diagnosis not present

## 2015-12-24 DIAGNOSIS — G4733 Obstructive sleep apnea (adult) (pediatric): Secondary | ICD-10-CM | POA: Diagnosis not present

## 2015-12-24 DIAGNOSIS — E119 Type 2 diabetes mellitus without complications: Secondary | ICD-10-CM | POA: Diagnosis not present

## 2015-12-24 DIAGNOSIS — I10 Essential (primary) hypertension: Secondary | ICD-10-CM | POA: Diagnosis not present

## 2015-12-24 DIAGNOSIS — R261 Paralytic gait: Secondary | ICD-10-CM | POA: Diagnosis not present

## 2015-12-28 DIAGNOSIS — M545 Low back pain: Secondary | ICD-10-CM | POA: Diagnosis not present

## 2015-12-28 DIAGNOSIS — R261 Paralytic gait: Secondary | ICD-10-CM | POA: Diagnosis not present

## 2015-12-28 DIAGNOSIS — E119 Type 2 diabetes mellitus without complications: Secondary | ICD-10-CM | POA: Diagnosis not present

## 2015-12-28 DIAGNOSIS — M6281 Muscle weakness (generalized): Secondary | ICD-10-CM | POA: Diagnosis not present

## 2015-12-28 DIAGNOSIS — G4733 Obstructive sleep apnea (adult) (pediatric): Secondary | ICD-10-CM | POA: Diagnosis not present

## 2015-12-28 DIAGNOSIS — I10 Essential (primary) hypertension: Secondary | ICD-10-CM | POA: Diagnosis not present

## 2015-12-29 DIAGNOSIS — M545 Low back pain: Secondary | ICD-10-CM | POA: Diagnosis not present

## 2015-12-29 DIAGNOSIS — M6281 Muscle weakness (generalized): Secondary | ICD-10-CM | POA: Diagnosis not present

## 2015-12-29 DIAGNOSIS — E119 Type 2 diabetes mellitus without complications: Secondary | ICD-10-CM | POA: Diagnosis not present

## 2015-12-29 DIAGNOSIS — I10 Essential (primary) hypertension: Secondary | ICD-10-CM | POA: Diagnosis not present

## 2015-12-29 DIAGNOSIS — R261 Paralytic gait: Secondary | ICD-10-CM | POA: Diagnosis not present

## 2015-12-29 DIAGNOSIS — G4733 Obstructive sleep apnea (adult) (pediatric): Secondary | ICD-10-CM | POA: Diagnosis not present

## 2016-01-01 DIAGNOSIS — I10 Essential (primary) hypertension: Secondary | ICD-10-CM | POA: Diagnosis not present

## 2016-01-01 DIAGNOSIS — E119 Type 2 diabetes mellitus without complications: Secondary | ICD-10-CM | POA: Diagnosis not present

## 2016-01-01 DIAGNOSIS — R261 Paralytic gait: Secondary | ICD-10-CM | POA: Diagnosis not present

## 2016-01-01 DIAGNOSIS — M6281 Muscle weakness (generalized): Secondary | ICD-10-CM | POA: Diagnosis not present

## 2016-01-01 DIAGNOSIS — G4733 Obstructive sleep apnea (adult) (pediatric): Secondary | ICD-10-CM | POA: Diagnosis not present

## 2016-01-01 DIAGNOSIS — M545 Low back pain: Secondary | ICD-10-CM | POA: Diagnosis not present

## 2016-01-03 DIAGNOSIS — R296 Repeated falls: Secondary | ICD-10-CM | POA: Diagnosis not present

## 2016-01-03 DIAGNOSIS — M545 Low back pain: Secondary | ICD-10-CM | POA: Diagnosis not present

## 2016-01-03 DIAGNOSIS — E7800 Pure hypercholesterolemia, unspecified: Secondary | ICD-10-CM | POA: Diagnosis not present

## 2016-01-03 DIAGNOSIS — E119 Type 2 diabetes mellitus without complications: Secondary | ICD-10-CM | POA: Diagnosis not present

## 2016-01-03 DIAGNOSIS — Z6841 Body Mass Index (BMI) 40.0 and over, adult: Secondary | ICD-10-CM | POA: Diagnosis not present

## 2016-01-03 DIAGNOSIS — Z7984 Long term (current) use of oral hypoglycemic drugs: Secondary | ICD-10-CM | POA: Diagnosis not present

## 2016-01-03 DIAGNOSIS — I10 Essential (primary) hypertension: Secondary | ICD-10-CM | POA: Diagnosis not present

## 2016-01-03 DIAGNOSIS — R202 Paresthesia of skin: Secondary | ICD-10-CM | POA: Diagnosis not present

## 2016-01-03 DIAGNOSIS — R410 Disorientation, unspecified: Secondary | ICD-10-CM | POA: Diagnosis not present

## 2016-01-03 DIAGNOSIS — M109 Gout, unspecified: Secondary | ICD-10-CM | POA: Diagnosis not present

## 2016-01-04 DIAGNOSIS — M6281 Muscle weakness (generalized): Secondary | ICD-10-CM | POA: Diagnosis not present

## 2016-01-04 DIAGNOSIS — I10 Essential (primary) hypertension: Secondary | ICD-10-CM | POA: Diagnosis not present

## 2016-01-04 DIAGNOSIS — M545 Low back pain: Secondary | ICD-10-CM | POA: Diagnosis not present

## 2016-01-04 DIAGNOSIS — R261 Paralytic gait: Secondary | ICD-10-CM | POA: Diagnosis not present

## 2016-01-04 DIAGNOSIS — G4733 Obstructive sleep apnea (adult) (pediatric): Secondary | ICD-10-CM | POA: Diagnosis not present

## 2016-01-04 DIAGNOSIS — E119 Type 2 diabetes mellitus without complications: Secondary | ICD-10-CM | POA: Diagnosis not present

## 2016-01-06 DIAGNOSIS — M545 Low back pain: Secondary | ICD-10-CM | POA: Diagnosis not present

## 2016-01-06 DIAGNOSIS — E119 Type 2 diabetes mellitus without complications: Secondary | ICD-10-CM | POA: Diagnosis not present

## 2016-01-06 DIAGNOSIS — M6281 Muscle weakness (generalized): Secondary | ICD-10-CM | POA: Diagnosis not present

## 2016-01-06 DIAGNOSIS — G4733 Obstructive sleep apnea (adult) (pediatric): Secondary | ICD-10-CM | POA: Diagnosis not present

## 2016-01-06 DIAGNOSIS — I10 Essential (primary) hypertension: Secondary | ICD-10-CM | POA: Diagnosis not present

## 2016-01-06 DIAGNOSIS — R261 Paralytic gait: Secondary | ICD-10-CM | POA: Diagnosis not present

## 2016-01-09 DIAGNOSIS — M545 Low back pain: Secondary | ICD-10-CM | POA: Diagnosis not present

## 2016-01-09 DIAGNOSIS — R261 Paralytic gait: Secondary | ICD-10-CM | POA: Diagnosis not present

## 2016-01-09 DIAGNOSIS — E119 Type 2 diabetes mellitus without complications: Secondary | ICD-10-CM | POA: Diagnosis not present

## 2016-01-09 DIAGNOSIS — G4733 Obstructive sleep apnea (adult) (pediatric): Secondary | ICD-10-CM | POA: Diagnosis not present

## 2016-01-09 DIAGNOSIS — M6281 Muscle weakness (generalized): Secondary | ICD-10-CM | POA: Diagnosis not present

## 2016-01-09 DIAGNOSIS — I10 Essential (primary) hypertension: Secondary | ICD-10-CM | POA: Diagnosis not present

## 2016-01-10 DIAGNOSIS — R261 Paralytic gait: Secondary | ICD-10-CM | POA: Diagnosis not present

## 2016-01-10 DIAGNOSIS — M545 Low back pain: Secondary | ICD-10-CM | POA: Diagnosis not present

## 2016-01-10 DIAGNOSIS — I10 Essential (primary) hypertension: Secondary | ICD-10-CM | POA: Diagnosis not present

## 2016-01-10 DIAGNOSIS — M6281 Muscle weakness (generalized): Secondary | ICD-10-CM | POA: Diagnosis not present

## 2016-01-10 DIAGNOSIS — G4733 Obstructive sleep apnea (adult) (pediatric): Secondary | ICD-10-CM | POA: Diagnosis not present

## 2016-01-10 DIAGNOSIS — E119 Type 2 diabetes mellitus without complications: Secondary | ICD-10-CM | POA: Diagnosis not present

## 2016-01-12 DIAGNOSIS — G4733 Obstructive sleep apnea (adult) (pediatric): Secondary | ICD-10-CM | POA: Diagnosis not present

## 2016-01-12 DIAGNOSIS — M6281 Muscle weakness (generalized): Secondary | ICD-10-CM | POA: Diagnosis not present

## 2016-01-12 DIAGNOSIS — I10 Essential (primary) hypertension: Secondary | ICD-10-CM | POA: Diagnosis not present

## 2016-01-12 DIAGNOSIS — M545 Low back pain: Secondary | ICD-10-CM | POA: Diagnosis not present

## 2016-01-12 DIAGNOSIS — E119 Type 2 diabetes mellitus without complications: Secondary | ICD-10-CM | POA: Diagnosis not present

## 2016-01-12 DIAGNOSIS — R261 Paralytic gait: Secondary | ICD-10-CM | POA: Diagnosis not present

## 2016-01-19 DIAGNOSIS — R261 Paralytic gait: Secondary | ICD-10-CM | POA: Diagnosis not present

## 2016-01-19 DIAGNOSIS — I10 Essential (primary) hypertension: Secondary | ICD-10-CM | POA: Diagnosis not present

## 2016-01-19 DIAGNOSIS — G4733 Obstructive sleep apnea (adult) (pediatric): Secondary | ICD-10-CM | POA: Diagnosis not present

## 2016-01-19 DIAGNOSIS — E119 Type 2 diabetes mellitus without complications: Secondary | ICD-10-CM | POA: Diagnosis not present

## 2016-01-19 DIAGNOSIS — M545 Low back pain: Secondary | ICD-10-CM | POA: Diagnosis not present

## 2016-01-19 DIAGNOSIS — M6281 Muscle weakness (generalized): Secondary | ICD-10-CM | POA: Diagnosis not present

## 2016-01-25 DIAGNOSIS — R261 Paralytic gait: Secondary | ICD-10-CM | POA: Diagnosis not present

## 2016-01-25 DIAGNOSIS — G4733 Obstructive sleep apnea (adult) (pediatric): Secondary | ICD-10-CM | POA: Diagnosis not present

## 2016-01-25 DIAGNOSIS — E119 Type 2 diabetes mellitus without complications: Secondary | ICD-10-CM | POA: Diagnosis not present

## 2016-01-25 DIAGNOSIS — M545 Low back pain: Secondary | ICD-10-CM | POA: Diagnosis not present

## 2016-01-25 DIAGNOSIS — M6281 Muscle weakness (generalized): Secondary | ICD-10-CM | POA: Diagnosis not present

## 2016-01-25 DIAGNOSIS — I10 Essential (primary) hypertension: Secondary | ICD-10-CM | POA: Diagnosis not present

## 2016-02-03 DIAGNOSIS — M545 Low back pain: Secondary | ICD-10-CM | POA: Diagnosis not present

## 2016-02-03 DIAGNOSIS — I10 Essential (primary) hypertension: Secondary | ICD-10-CM | POA: Diagnosis not present

## 2016-02-03 DIAGNOSIS — M6281 Muscle weakness (generalized): Secondary | ICD-10-CM | POA: Diagnosis not present

## 2016-02-03 DIAGNOSIS — E119 Type 2 diabetes mellitus without complications: Secondary | ICD-10-CM | POA: Diagnosis not present

## 2016-02-03 DIAGNOSIS — R261 Paralytic gait: Secondary | ICD-10-CM | POA: Diagnosis not present

## 2016-02-03 DIAGNOSIS — G4733 Obstructive sleep apnea (adult) (pediatric): Secondary | ICD-10-CM | POA: Diagnosis not present

## 2016-02-08 DIAGNOSIS — R261 Paralytic gait: Secondary | ICD-10-CM | POA: Diagnosis not present

## 2016-02-08 DIAGNOSIS — M6281 Muscle weakness (generalized): Secondary | ICD-10-CM | POA: Diagnosis not present

## 2016-02-08 DIAGNOSIS — I10 Essential (primary) hypertension: Secondary | ICD-10-CM | POA: Diagnosis not present

## 2016-02-08 DIAGNOSIS — G4733 Obstructive sleep apnea (adult) (pediatric): Secondary | ICD-10-CM | POA: Diagnosis not present

## 2016-02-08 DIAGNOSIS — M545 Low back pain: Secondary | ICD-10-CM | POA: Diagnosis not present

## 2016-02-08 DIAGNOSIS — E119 Type 2 diabetes mellitus without complications: Secondary | ICD-10-CM | POA: Diagnosis not present

## 2016-02-16 DIAGNOSIS — M6281 Muscle weakness (generalized): Secondary | ICD-10-CM | POA: Diagnosis not present

## 2016-02-16 DIAGNOSIS — M545 Low back pain: Secondary | ICD-10-CM | POA: Diagnosis not present

## 2016-02-16 DIAGNOSIS — R261 Paralytic gait: Secondary | ICD-10-CM | POA: Diagnosis not present

## 2016-02-16 DIAGNOSIS — I10 Essential (primary) hypertension: Secondary | ICD-10-CM | POA: Diagnosis not present

## 2016-02-16 DIAGNOSIS — E119 Type 2 diabetes mellitus without complications: Secondary | ICD-10-CM | POA: Diagnosis not present

## 2016-02-16 DIAGNOSIS — G4733 Obstructive sleep apnea (adult) (pediatric): Secondary | ICD-10-CM | POA: Diagnosis not present

## 2016-06-25 IMAGING — CT CT HEAD W/O CM
4 of 5 series · 17 of 47 positions shown, 18 images · non-contrast
Comparison: None.

CLINICAL DATA: Recent fall with posterior head laceration

EXAM:
CT HEAD WITHOUT CONTRAST
CT CERVICAL SPINE WITHOUT CONTRAST
TECHNIQUE: Multidetector CT imaging of the head and cervical spine was
performed following the standard protocol without intravenous
contrast. Multiplanar CT image reconstructions of the cervical spine
were also generated.

[Series 3: head w/o · axial · non-contrast · 0.43mm/px · z∈[-104,-14]mm · 4 of 32 slices shown, 5 images]
[im 7/32  brain]
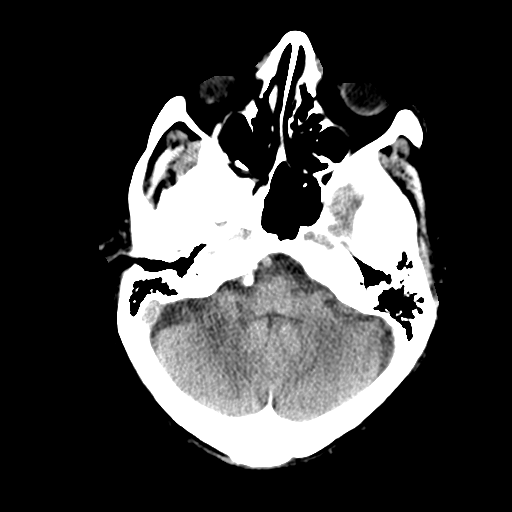
[im 7/32  bone]
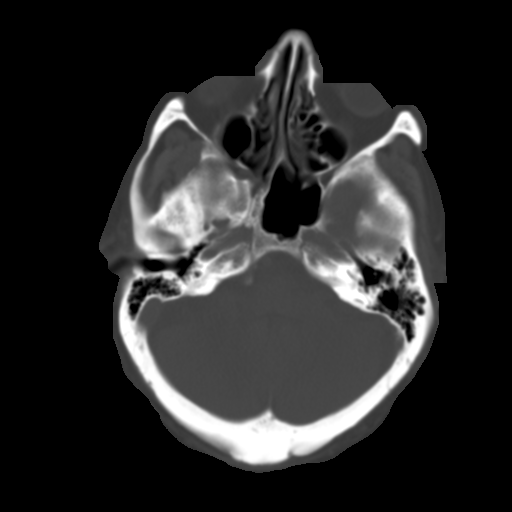
[im 13/32  brain]
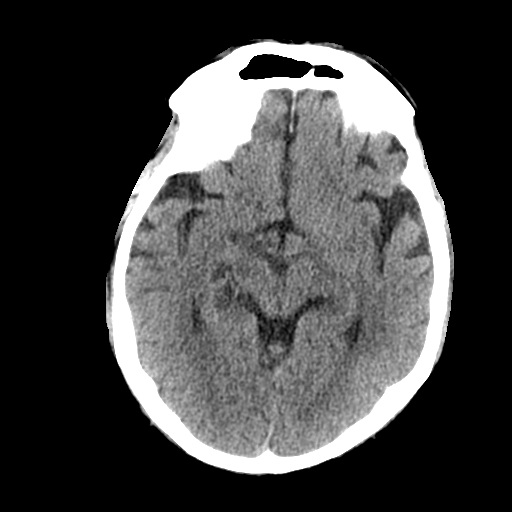
[im 19/32  brain]
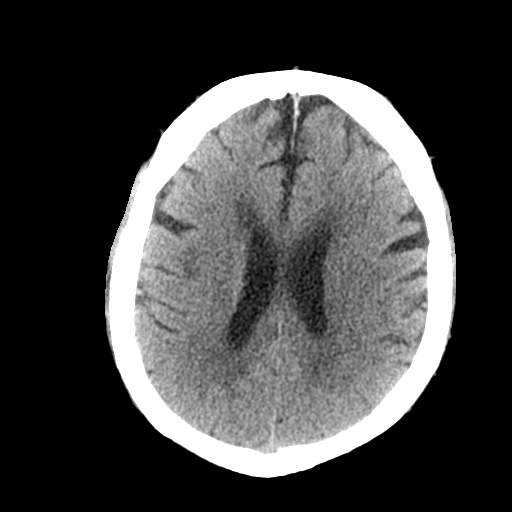
[im 25/32  brain]
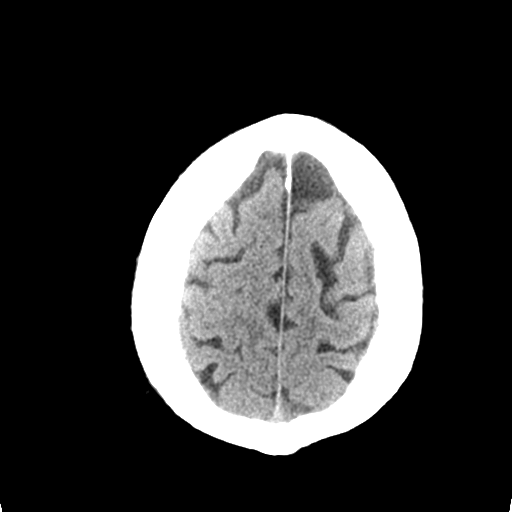

[Series 4: bone windows · axial · 0.43mm/px · z∈[-116,-2]mm · 7 of 52 slices shown]
[im 7/52  bone]
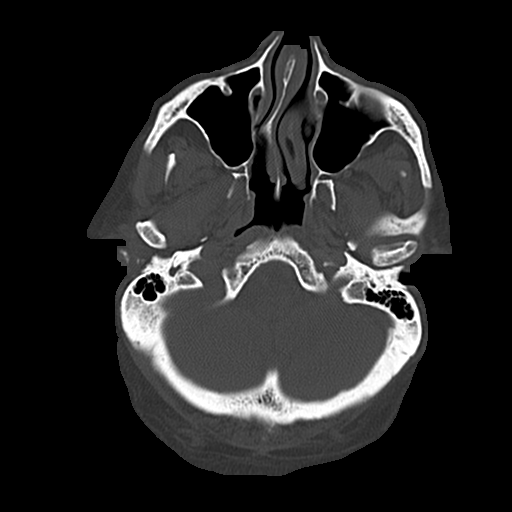
[im 13/52  bone]
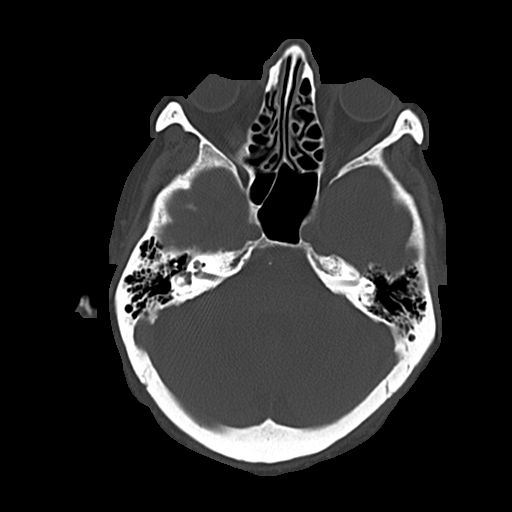
[im 20/52  bone]
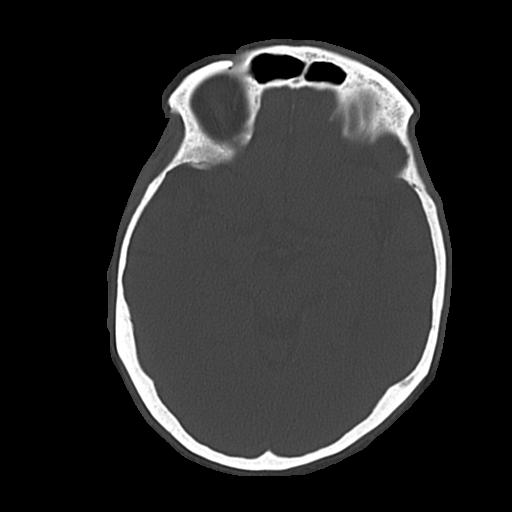
[im 26/52  bone]
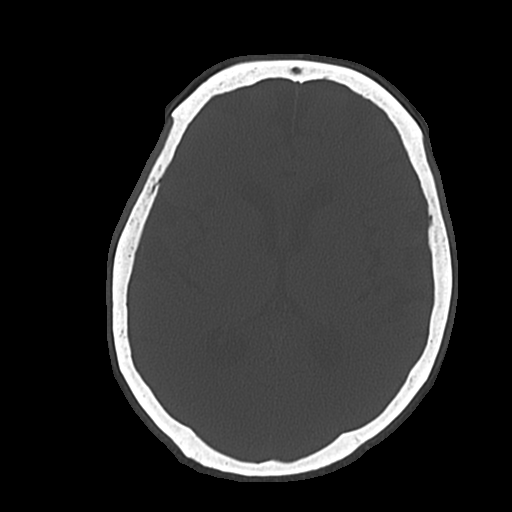
[im 32/52  bone]
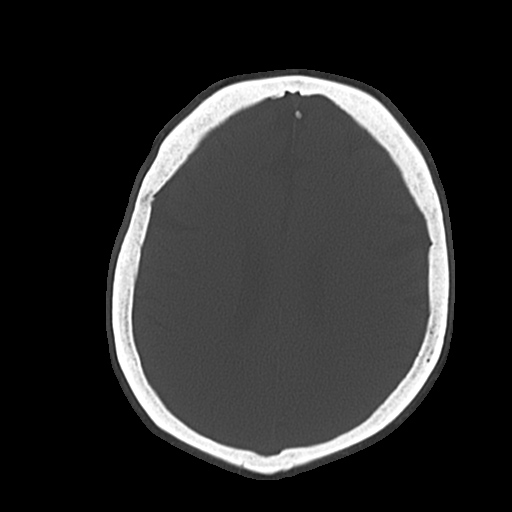
[im 39/52  bone]
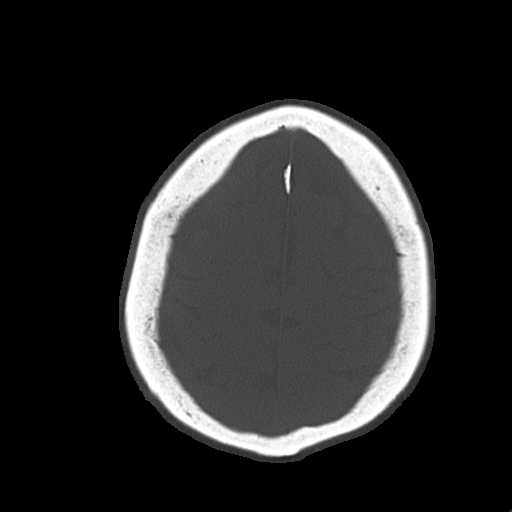
[im 45/52  bone]
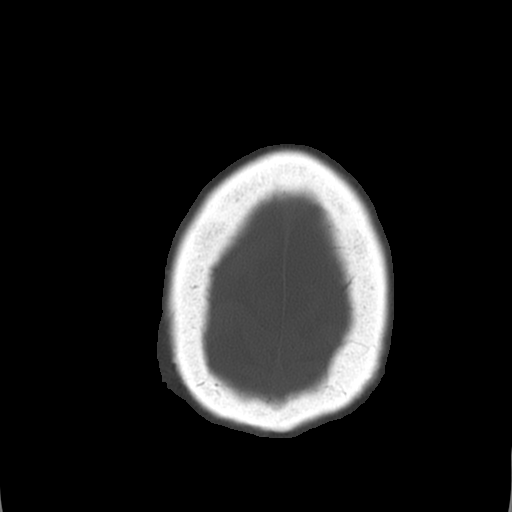

[Series 9: coronal · coronal · 0.24mm/px · 3 of 38 slices shown]
[im 13/38  brain]
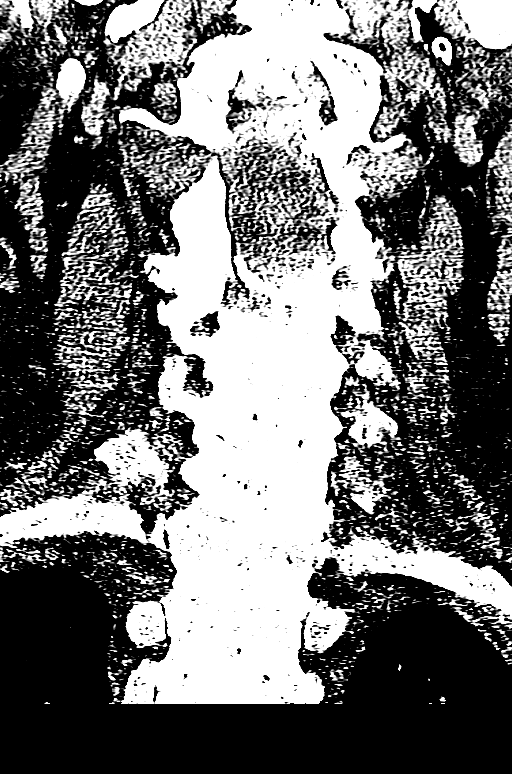
[im 17/38  brain]
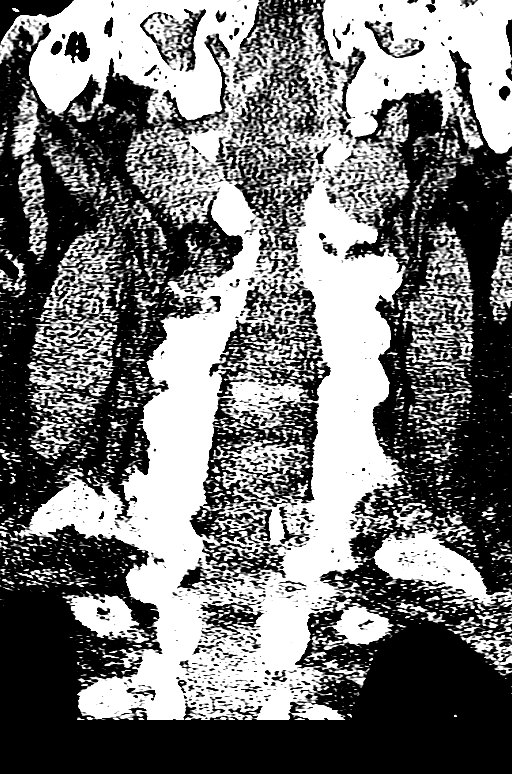
[im 21/38  brain]
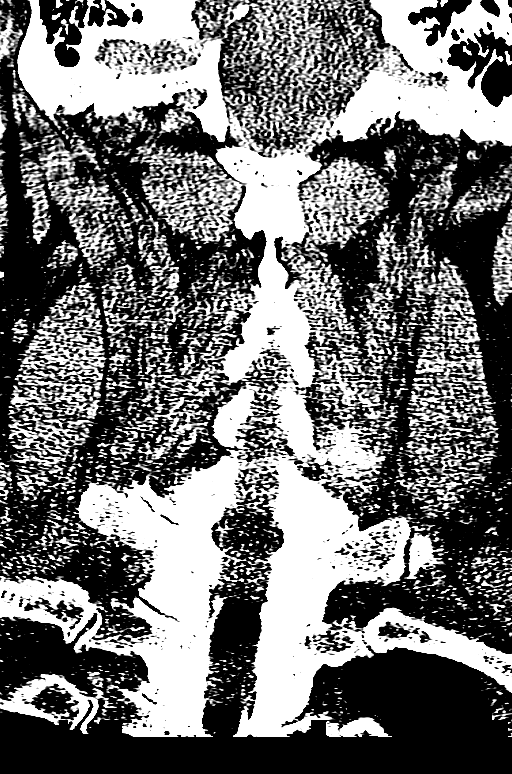

[Series 10: sagittal · sagittal · 0.32mm/px · 3 of 41 slices shown]
[im 14/41  brain]
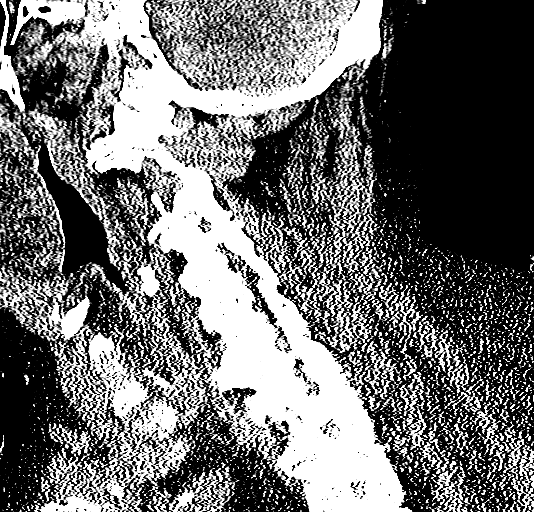
[im 21/41  brain]
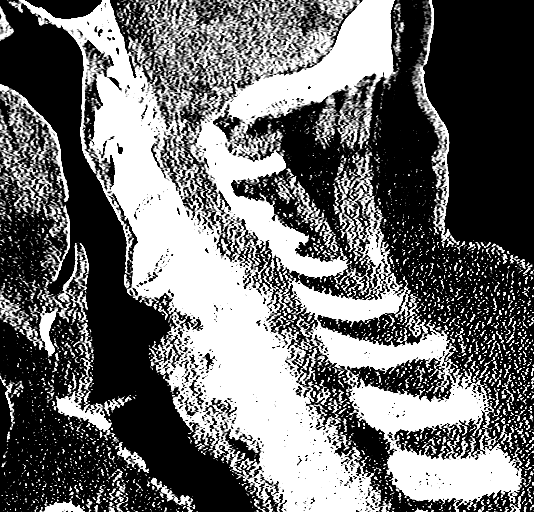
[im 27/41  brain]
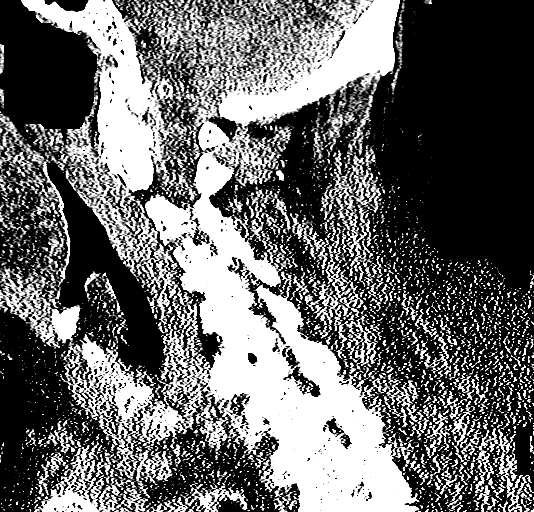

[17 of 47 positions shown; findings below may reference images not displayed]

FINDINGS: CT HEAD FINDINGS

The bony calvarium is intact. Mild atrophic changes and chronic
white matter ischemic change is noted. No findings to suggest acute
hemorrhage, acute infarction or space-occupying mass lesion are
noted.

CT CERVICAL SPINE FINDINGS

Seven cervical segments are well visualized. Vertebral body height
is well maintained. Mild degenerative anterolisthesis of C3 on C4 is
noted. Multilevel osteophytic changes and disc space narrowing is
seen. Multilevel neural foraminal narrowing is noted secondary to
degenerative changes. No acute fracture is seen. No gross soft
tissue abnormality is noted.
IMPRESSION: CT of the head: Chronic atrophic and ischemic changes without acute
abnormality.

CT of the cervical spine: Multilevel degenerative change without
acute abnormality.

## 2016-07-03 DIAGNOSIS — Z6841 Body Mass Index (BMI) 40.0 and over, adult: Secondary | ICD-10-CM | POA: Diagnosis not present

## 2016-07-03 DIAGNOSIS — M545 Low back pain: Secondary | ICD-10-CM | POA: Diagnosis not present

## 2016-07-03 DIAGNOSIS — Z7984 Long term (current) use of oral hypoglycemic drugs: Secondary | ICD-10-CM | POA: Diagnosis not present

## 2016-07-03 DIAGNOSIS — E7800 Pure hypercholesterolemia, unspecified: Secondary | ICD-10-CM | POA: Diagnosis not present

## 2016-07-03 DIAGNOSIS — M109 Gout, unspecified: Secondary | ICD-10-CM | POA: Diagnosis not present

## 2016-07-03 DIAGNOSIS — E119 Type 2 diabetes mellitus without complications: Secondary | ICD-10-CM | POA: Diagnosis not present

## 2016-07-03 DIAGNOSIS — I10 Essential (primary) hypertension: Secondary | ICD-10-CM | POA: Diagnosis not present

## 2016-07-25 DIAGNOSIS — E119 Type 2 diabetes mellitus without complications: Secondary | ICD-10-CM | POA: Diagnosis not present

## 2016-07-25 DIAGNOSIS — Z961 Presence of intraocular lens: Secondary | ICD-10-CM | POA: Diagnosis not present

## 2016-07-25 DIAGNOSIS — H532 Diplopia: Secondary | ICD-10-CM | POA: Diagnosis not present

## 2016-07-25 DIAGNOSIS — H52203 Unspecified astigmatism, bilateral: Secondary | ICD-10-CM | POA: Diagnosis not present

## 2016-12-19 DIAGNOSIS — G4733 Obstructive sleep apnea (adult) (pediatric): Secondary | ICD-10-CM | POA: Diagnosis not present

## 2017-01-16 DIAGNOSIS — G8929 Other chronic pain: Secondary | ICD-10-CM | POA: Diagnosis not present

## 2017-01-16 DIAGNOSIS — E7800 Pure hypercholesterolemia, unspecified: Secondary | ICD-10-CM | POA: Diagnosis not present

## 2017-01-16 DIAGNOSIS — E119 Type 2 diabetes mellitus without complications: Secondary | ICD-10-CM | POA: Diagnosis not present

## 2017-01-16 DIAGNOSIS — E781 Pure hyperglyceridemia: Secondary | ICD-10-CM | POA: Diagnosis not present

## 2017-01-16 DIAGNOSIS — Z23 Encounter for immunization: Secondary | ICD-10-CM | POA: Diagnosis not present

## 2017-01-16 DIAGNOSIS — M109 Gout, unspecified: Secondary | ICD-10-CM | POA: Diagnosis not present

## 2017-01-16 DIAGNOSIS — I10 Essential (primary) hypertension: Secondary | ICD-10-CM | POA: Diagnosis not present

## 2017-03-05 DIAGNOSIS — R443 Hallucinations, unspecified: Secondary | ICD-10-CM | POA: Diagnosis not present

## 2017-03-05 DIAGNOSIS — M545 Low back pain: Secondary | ICD-10-CM | POA: Diagnosis not present

## 2017-03-27 ENCOUNTER — Telehealth (INDEPENDENT_AMBULATORY_CARE_PROVIDER_SITE_OTHER): Payer: Self-pay | Admitting: Orthopaedic Surgery

## 2017-03-27 NOTE — Telephone Encounter (Signed)
Whenever the referral has been scheduled please call Pamala Hurry and let her know at [phone]

## 2017-03-30 ENCOUNTER — Ambulatory Visit (INDEPENDENT_AMBULATORY_CARE_PROVIDER_SITE_OTHER): Payer: Self-pay | Admitting: Orthopaedic Surgery

## 2017-04-18 ENCOUNTER — Ambulatory Visit (INDEPENDENT_AMBULATORY_CARE_PROVIDER_SITE_OTHER): Payer: Medicare Other | Admitting: Orthopaedic Surgery

## 2017-04-18 ENCOUNTER — Encounter (INDEPENDENT_AMBULATORY_CARE_PROVIDER_SITE_OTHER): Payer: Self-pay | Admitting: Orthopaedic Surgery

## 2017-04-18 ENCOUNTER — Ambulatory Visit (INDEPENDENT_AMBULATORY_CARE_PROVIDER_SITE_OTHER): Payer: Medicare Other

## 2017-04-18 VITALS — BP 116/69 | HR 93 | Ht 64.5 in | Wt 238.0 lb

## 2017-04-18 DIAGNOSIS — M47816 Spondylosis without myelopathy or radiculopathy, lumbar region: Secondary | ICD-10-CM

## 2017-04-18 DIAGNOSIS — M545 Low back pain, unspecified: Secondary | ICD-10-CM

## 2017-04-18 NOTE — Progress Notes (Signed)
Office Visit Note   Patient: William Mullins           Date of Birth: 03/02/1931           MRN: [id number] Visit Date: 04/18/2017              Requested by: Kathyrn Lass, MD [place], [place] [address] PCP: No primary care provider on file.   Assessment & Plan: Visit Diagnoses:  1. Low back pain without sciatica, unspecified back pain laterality, unspecified chronicity     Plan: We discussed the multilevel lumbar spondylosis that is present and reviewed his previous MRI with the patient and also his son.  He needs to work on walking on a daily basis as much as he is able and then sit and rest and then repeat.  He can use plain Tylenol.  He can talk with his primary care physician to see if they will allow him to occasionally use some ibuprofen .  I do not have any recent renal labs to review.  He asked about Percocet and I discussed with him that narcotic use was only a short-term option and that long-term narcotics do not work well for chronic pain.  He needs to work on walking and sit and resting.  He is got an adjustable bed, has stairs that he is able to get in and out of with arm rest.  He can work on losing some weight which would also help his diabetes and help his ambulation potential.  If he decides he wants to proceed with an MRI can let me know but with his multilevel changes he understands that surgery would be a significant undertaking for him.  Follow-Up Instructions: No Follow-up on file.   Orders:  Orders Placed This Encounter  Procedures  . XR Lumbar Spine 2-3 Views   No orders of the defined types were placed in this encounter.     Procedures: No procedures performed   Clinical Data: No additional findings.   Subjective: Chief Complaint  Patient presents with  . Lower Back - Pain    HPI 84 year old male referred by Kathyrn Lass.  He has been taking some Percocet 10/325 in the past for his pain which seemed to help.  He has multilevel lumbar  spondylosis severe with grade 2 spondylolisthesis at L5-S1.  Past history of gout is got arthritis in his wrist fingers and has great difficulty getting up to ambulate.  He said bilateral knee replacement shoulder surgery cataract and retinal surgery.  The patient also has diabetes.  Patient ambulates with a walker for the last 2 years she has to front wheels.  Once he is up he states he can walk some but has increased back pain.  He has had epidurals in the past that gave him some temporary relief.  Review of Systems.  14 point review of system positive for diabetes, hypertension, arthritis.  Non-smoker.  Previous bilateral total knee arthroplasties.  Multilevel lumbar spondylosis.  Pars defect L5 bilateral with grade 2 spondylolisthesis.  Previous MRI showed multilevel lateral recess narrowing and areas of mild to moderate stenosis.   Objective: Vital Signs: BP 116/69 (BP Location: Left Arm, Patient Position: Sitting, Cuff Size: Large)   Pulse 93   Ht 5' 4.5" (1.638 m)   Wt 238 lb (108 kg)   BMI 40.22 kg/m   Physical Exam  Constitutional: He is oriented to person, place, and time.  Patient in a wheelchair.  Well-healed total knee  arthroplasty incisions.  HENT:  Head: Normocephalic.  Eyes: Pupils are equal, round, and reactive to light.  Neck: No tracheal deviation present. No thyromegaly present.  Decreased cervical range of motion no brachial plexus tenderness.  Cardiovascular: Normal rate.  Pulmonary/Chest: Effort normal.  Abdominal: Soft.  Neurological: He is alert and oriented to person, place, and time.    Ortho Exam patient has arthritic deformities of the wrist and MCP joints DIP joints with Heberden's nodes.  No gouty tophi noted.  Well-healed bilateral knee incisions.  Patient is in a wheelchair needs assistance to get from sitting to standing.  Specialty Comments:  No specialty comments available.  Imaging: Xr Lumbar Spine 2-3 Views  Result Date: 04/18/2017 AP lateral  lumbar x-rays demonstrate multilevel spondylosis with large endplate spurs at all levels lumbar spine.  Greater than 10 mm anterolisthesis noted at L5-S1.  No acute fractures noted. Impression: Grade 2 spondylolisthesis L5-S1 multilevel lumbar spondylosis with facet degenerative changes and large endplate spurs.    PMFS History: There are no active problems to display for this patient.  Past Medical History:  Diagnosis Date  . Arthritis   . Diabetes mellitus without complication ([place])   . Hypertension     No family history on file.  Past Surgical History:  Procedure Laterality Date  . JOINT REPLACEMENT Bilateral   . SHOULDER SURGERY     Social History   Occupational History  . Not on file  Tobacco Use  . Smoking status: Never Smoker  Substance and Sexual Activity  . Alcohol use: No  . Drug use: No  . Sexual activity: Not on file

## 2017-07-23 DIAGNOSIS — M109 Gout, unspecified: Secondary | ICD-10-CM | POA: Diagnosis not present

## 2017-07-23 DIAGNOSIS — D649 Anemia, unspecified: Secondary | ICD-10-CM | POA: Diagnosis not present

## 2017-07-23 DIAGNOSIS — I10 Essential (primary) hypertension: Secondary | ICD-10-CM | POA: Diagnosis not present

## 2017-07-23 DIAGNOSIS — E7800 Pure hypercholesterolemia, unspecified: Secondary | ICD-10-CM | POA: Diagnosis not present

## 2017-07-23 DIAGNOSIS — Z6841 Body Mass Index (BMI) 40.0 and over, adult: Secondary | ICD-10-CM | POA: Diagnosis not present

## 2017-07-23 DIAGNOSIS — E1140 Type 2 diabetes mellitus with diabetic neuropathy, unspecified: Secondary | ICD-10-CM | POA: Diagnosis not present

## 2017-07-23 DIAGNOSIS — E1169 Type 2 diabetes mellitus with other specified complication: Secondary | ICD-10-CM | POA: Diagnosis not present

## 2017-08-01 DIAGNOSIS — E119 Type 2 diabetes mellitus without complications: Secondary | ICD-10-CM | POA: Diagnosis not present

## 2017-08-01 DIAGNOSIS — Z961 Presence of intraocular lens: Secondary | ICD-10-CM | POA: Diagnosis not present

## 2017-08-01 DIAGNOSIS — H52203 Unspecified astigmatism, bilateral: Secondary | ICD-10-CM | POA: Diagnosis not present

## 2017-08-01 DIAGNOSIS — H532 Diplopia: Secondary | ICD-10-CM | POA: Diagnosis not present

## 2017-08-09 DIAGNOSIS — E875 Hyperkalemia: Secondary | ICD-10-CM | POA: Diagnosis not present

## 2017-08-09 DIAGNOSIS — D72829 Elevated white blood cell count, unspecified: Secondary | ICD-10-CM | POA: Diagnosis not present

## 2018-02-04 DIAGNOSIS — Z23 Encounter for immunization: Secondary | ICD-10-CM | POA: Diagnosis not present

## 2018-02-04 DIAGNOSIS — E7800 Pure hypercholesterolemia, unspecified: Secondary | ICD-10-CM | POA: Diagnosis not present

## 2018-02-04 DIAGNOSIS — D485 Neoplasm of uncertain behavior of skin: Secondary | ICD-10-CM | POA: Diagnosis not present

## 2018-02-04 DIAGNOSIS — I10 Essential (primary) hypertension: Secondary | ICD-10-CM | POA: Diagnosis not present

## 2018-02-04 DIAGNOSIS — M109 Gout, unspecified: Secondary | ICD-10-CM | POA: Diagnosis not present

## 2018-02-04 DIAGNOSIS — E1169 Type 2 diabetes mellitus with other specified complication: Secondary | ICD-10-CM | POA: Diagnosis not present

## 2018-02-04 DIAGNOSIS — E1140 Type 2 diabetes mellitus with diabetic neuropathy, unspecified: Secondary | ICD-10-CM | POA: Diagnosis not present

## 2018-02-04 DIAGNOSIS — M545 Low back pain: Secondary | ICD-10-CM | POA: Diagnosis not present

## 2018-02-19 DIAGNOSIS — G4733 Obstructive sleep apnea (adult) (pediatric): Secondary | ICD-10-CM | POA: Diagnosis not present

## 2018-02-19 DIAGNOSIS — I499 Cardiac arrhythmia, unspecified: Secondary | ICD-10-CM | POA: Diagnosis not present

## 2018-02-19 DIAGNOSIS — I4891 Unspecified atrial fibrillation: Secondary | ICD-10-CM | POA: Diagnosis not present

## 2018-02-21 ENCOUNTER — Telehealth: Payer: Self-pay

## 2018-02-21 NOTE — Telephone Encounter (Signed)
Called Dr. Dahlia Bailiff office, requested info to be faxed over to your number.

## 2018-02-21 NOTE — Telephone Encounter (Signed)
Spoke with the patient's wife, she is trying to get him scheduled for an appointment per PCP since patient is newly in afib. Our office is booked and Dr. Radford Pax will follow the patient after. Sending to afib clinic to be scheduled.

## 2018-02-21 NOTE — Telephone Encounter (Signed)
-----   Message from Sarina Ill, RN sent at 02/21/2018  9:08 AM EST ----- Regarding: FW: Needs new pt appt. Attempted to call. ----- Message ----- From: Daune Perch, NP Sent: 02/19/2018   5:50 PM EST To: [facility] Triage Subject: Needs new pt appt.                             Dr. Radford Pax received call from the patient's PCP, Dr. Zadie Rhine at [facility]. Pt in new afib and needs to be seen in our office as soon as can be arranged. Please call pt to arrange. H- [place] [phone]  Thanks, Pecolia Ades, NP

## 2018-02-21 NOTE — Telephone Encounter (Signed)
appt made for 1/6 per daughter-in-law request.

## 2018-02-22 ENCOUNTER — Ambulatory Visit: Payer: Medicare Other | Admitting: Interventional Cardiology

## 2018-02-25 ENCOUNTER — Encounter (HOSPITAL_COMMUNITY): Payer: Self-pay | Admitting: Nurse Practitioner

## 2018-02-25 ENCOUNTER — Ambulatory Visit (HOSPITAL_COMMUNITY)
Admission: RE | Admit: 2018-02-25 | Discharge: 2018-02-25 | Disposition: A | Discharge status: Home / Self Care | Payer: Medicare Other | Source: Ambulatory Visit | Attending: Nurse Practitioner | Admitting: Nurse Practitioner

## 2018-02-25 VITALS — BP 124/64 | HR 52 | Ht 64.5 in | Wt 229.0 lb

## 2018-02-25 DIAGNOSIS — I10 Essential (primary) hypertension: Secondary | ICD-10-CM | POA: Diagnosis not present

## 2018-02-25 DIAGNOSIS — I441 Atrioventricular block, second degree: Secondary | ICD-10-CM | POA: Diagnosis not present

## 2018-02-25 DIAGNOSIS — I4430 Unspecified atrioventricular block: Secondary | ICD-10-CM | POA: Diagnosis not present

## 2018-02-25 DIAGNOSIS — Z7984 Long term (current) use of oral hypoglycemic drugs: Secondary | ICD-10-CM | POA: Insufficient documentation

## 2018-02-25 DIAGNOSIS — Z79899 Other long term (current) drug therapy: Secondary | ICD-10-CM | POA: Insufficient documentation

## 2018-02-25 DIAGNOSIS — E119 Type 2 diabetes mellitus without complications: Secondary | ICD-10-CM | POA: Diagnosis not present

## 2018-02-25 DIAGNOSIS — I491 Atrial premature depolarization: Secondary | ICD-10-CM | POA: Insufficient documentation

## 2018-02-25 DIAGNOSIS — M069 Rheumatoid arthritis, unspecified: Secondary | ICD-10-CM | POA: Diagnosis not present

## 2018-02-25 NOTE — Patient Instructions (Signed)
Stop eliquis 

## 2018-02-25 NOTE — Progress Notes (Signed)
Primary Care Physician: No primary care provider on file. Referring Physician: Milagros Evener, MD   William Mullins is a 85 y.o. male with a h/o DM, HTN, rheumatoid arthritis, basically chair bound, that is in the afib clinic from PCP to be evaluated for new onset afib. Apparently, the pt was not able to get out of his lift chair, EMS was called to help. While there, they "checked out the pt." He was placed on the monitor and pt was told he was in afib and was told to f/u with PCP. He saw the PCP, EKG done and started on eliquis. However, EKG today, EMS strip (wife had form EMS visit) and EKG in PCP's office, all  show AV block, not new onset afib. This was confirmed with Dr. Rayann Heman. Pt states that he is asymptomatic.He is not on any AV nodal blocking agents.   Today, he denies symptoms of palpitations, chest pain, shortness of breath, orthopnea, PND, lower extremity edema, dizziness, presyncope, syncope, or neurologic sequela. The patient is tolerating medications without difficulties and is otherwise without complaint today.   Past Medical History:  Diagnosis Date  . Arthritis   . Diabetes mellitus without complication ([place])   . Hypertension    Past Surgical History:  Procedure Laterality Date  . JOINT REPLACEMENT Bilateral   . SHOULDER SURGERY      Current Outpatient Medications  Medication Sig Dispense Refill  . allopurinol (ZYLOPRIM) 100 MG tablet Take 100 mg by mouth daily.  2  . amLODipine (NORVASC) 5 MG tablet Take 5 mg by mouth daily.    Marland Kitchen aspirin EC 81 MG tablet Take 81 mg by mouth daily.    . benazepril (LOTENSIN) 10 MG tablet Take 10 mg by mouth daily.    . cholecalciferol (VITAMIN D) 1000 units tablet Take 1,000 Units by mouth daily.    . meloxicam (MOBIC) 7.5 MG tablet Take 7.5 mg by mouth daily.    . metFORMIN (GLUCOPHAGE) 500 MG tablet Take 500 mg by mouth daily.    . Multiple Vitamin (MULTIVITAMIN WITH MINERALS) TABS tablet Take 1 tablet by mouth daily.    .  vitamin C (ASCORBIC ACID) 500 MG tablet Take 500 mg by mouth daily.    . vitamin E 400 UNIT capsule Take 400 Units by mouth daily.     No current facility-administered medications for this encounter.     No Known Allergies  Social History   Socioeconomic History  . Marital status: Single    Spouse name: Not on file  . Number of children: Not on file  . Years of education: Not on file  . Highest education level: Not on file  Occupational History  . Not on file  Social Needs  . Financial resource strain: Not on file  . Food insecurity:    Worry: Not on file    Inability: Not on file  . Transportation needs:    Medical: Not on file    Non-medical: Not on file  Tobacco Use  . Smoking status: Never Smoker  . Smokeless tobacco: Never Used  Substance and Sexual Activity  . Alcohol use: No  . Drug use: No  . Sexual activity: Not on file  Lifestyle  . Physical activity:    Days per week: Not on file    Minutes per session: Not on file  . Stress: Not on file  Relationships  . Social connections:    Talks on phone: Not on file    Gets  together: Not on file    Attends religious service: Not on file    Active member of club or organization: Not on file    Attends meetings of clubs or organizations: Not on file    Relationship status: Not on file  . Intimate partner violence:    Fear of current or ex partner: Not on file    Emotionally abused: Not on file    Physically abused: Not on file    Forced sexual activity: Not on file  Other Topics Concern  . Not on file  Social History Narrative  . Not on file    No family history on file.  ROS- All systems are reviewed and negative except as per the HPI above  Physical Exam: Vitals:   02/25/18 1514  BP: 124/64  Pulse: (!) 52  Weight: 103.9 kg  Height: 5' 4.5" (1.638 m)   Wt Readings from Last 3 Encounters:  02/25/18 103.9 kg  04/18/17 108 kg  03/21/15 113.4 kg    Labs: Lab Results  Component Value Date   NA  140 03/21/2015   K 4.6 03/21/2015   CL 107 03/21/2015   CO2 24 03/21/2015   GLUCOSE 128 (H) 03/21/2015   BUN 43 (H) 03/21/2015   CREATININE 1.21 03/21/2015   CALCIUM 9.0 03/21/2015   No results found for: INR No results found for: CHOL, HDL, LDLCALC, TRIG   GEN- The patient is well appearing, alert and oriented x 3 today.   Head- normocephalic, atraumatic Eyes-  Sclera clear, conjunctiva pink Ears- hearing intact Oropharynx- clear Neck- supple, no JVP Lymph- no cervical lymphadenopathy Lungs- Clear to ausculation bilaterally, normal work of breathing Heart- Slow mildly irregular rate and rhythm, no murmurs, rubs or gallops, PMI not laterally displaced GI- soft, NT, ND, + BS Extremities- no clubbing, cyanosis, or edema MS- no significant deformity or atrophy Skin- no rash or lesion Psych- euthymic mood, full affect Neuro- strength and sensation are intact  EKG- EMS strip, PCP EKG, and ekg today shows AV block, 2nd degree vrs 3rd degree     Assessment and Plan: 1. AV block He is not symptomatic with a v rate of low 50's, however, he is chair bound from arthritis, bad back I will stop eliquis and I have made an appointment with Dr. Lovena Le on Wednesday to be evaluated for possible PPM  Emanuel Dowson C. Makyla Bye, [facility] [address] [place], [place] [address] [phone]

## 2018-02-26 ENCOUNTER — Encounter: Payer: Self-pay | Admitting: Internal Medicine

## 2018-02-27 ENCOUNTER — Encounter: Payer: Self-pay | Admitting: Internal Medicine

## 2018-02-27 ENCOUNTER — Ambulatory Visit (INDEPENDENT_AMBULATORY_CARE_PROVIDER_SITE_OTHER): Payer: Medicare Other | Admitting: Internal Medicine

## 2018-02-27 VITALS — BP 126/58 | HR 87 | Ht 64.5 in | Wt 229.0 lb

## 2018-02-27 DIAGNOSIS — I441 Atrioventricular block, second degree: Secondary | ICD-10-CM

## 2018-02-27 NOTE — Patient Instructions (Addendum)
Medication Instructions:  Your physician recommends that you continue on your current medications as directed. Please refer to the Current Medication list given to you today.  Labwork: None ordered.  Testing/Procedures: None ordered.  Follow-Up: Your physician wants you to follow-up in: as needed with Dr. Taylor.      Any Other Special Instructions Will Be Listed Below (If Applicable).  If you need a refill on your cardiac medications before your next appointment, please call your pharmacy.   

## 2018-02-27 NOTE — Progress Notes (Signed)
HPI [name] is referred by William Mullins for evaluation of heart block. He is a pleasant 85 yo man who lives in a wheel chair. He cannot walk due to weakness and loss of balance. He was noted to have bradycardia and was found to have high grade heart block. He has never had syncope, dizziness or symptomatic bradycardia. He has longstanding conduction disease with marked first degree AV block by ECG. More recently his ECG demonstrated alternating 3:2 and 2:1 AV block. He denies sob or chest pain.   No Known Allergies   Current Outpatient Medications  Medication Sig Dispense Refill  . allopurinol (ZYLOPRIM) 100 MG tablet Take 100 mg by mouth daily.  2  . amLODipine (NORVASC) 5 MG tablet Take 5 mg by mouth daily.    Marland Kitchen aspirin EC 81 MG tablet Take 81 mg by mouth daily.    . benazepril (LOTENSIN) 10 MG tablet Take 10 mg by mouth daily.    . cholecalciferol (VITAMIN D) 1000 units tablet Take 1,000 Units by mouth daily.    . meloxicam (MOBIC) 7.5 MG tablet Take 7.5 mg by mouth daily.    . metFORMIN (GLUCOPHAGE) 500 MG tablet Take 500 mg by mouth daily.    . Multiple Vitamin (MULTIVITAMIN WITH MINERALS) TABS tablet Take 1 tablet by mouth daily.    . vitamin C (ASCORBIC ACID) 500 MG tablet Take 500 mg by mouth daily.    . vitamin E 400 UNIT capsule Take 400 Units by mouth daily.     No current facility-administered medications for this visit.      Past Medical History:  Diagnosis Date  . Arthritis   . Diabetes mellitus without complication ([place])   . Hypertension     ROS:   All systems reviewed and negative except as noted in the HPI.   Past Surgical History:  Procedure Laterality Date  . JOINT REPLACEMENT Bilateral   . SHOULDER SURGERY       No family history on file.   Social History   Socioeconomic History  . Marital status: Single    Spouse name: Not on file  . Number of children: Not on file  . Years of education: Not on file  . Highest education level: Not  on file  Occupational History  . Not on file  Social Needs  . Financial resource strain: Not on file  . Food insecurity:    Worry: Not on file    Inability: Not on file  . Transportation needs:    Medical: Not on file    Non-medical: Not on file  Tobacco Use  . Smoking status: Never Smoker  . Smokeless tobacco: Never Used  Substance and Sexual Activity  . Alcohol use: No  . Drug use: No  . Sexual activity: Not on file  Lifestyle  . Physical activity:    Days per week: Not on file    Minutes per session: Not on file  . Stress: Not on file  Relationships  . Social connections:    Talks on phone: Not on file    Gets together: Not on file    Attends religious service: Not on file    Active member of club or organization: Not on file    Attends meetings of clubs or organizations: Not on file    Relationship status: Not on file  . Intimate partner violence:    Fear of current or ex partner: Not on file    Emotionally  abused: Not on file    Physically abused: Not on file    Forced sexual activity: Not on file  Other Topics Concern  . Not on file  Social History Narrative  . Not on file     BP (!) 126/58   Pulse 87   Ht 5' 4.5" (1.638 m)   Wt 229 lb (103.9 kg)   SpO2 97%   BMI 38.70 kg/m   Physical Exam:  Chronically ill appearing 85 yo woman, NAD HEENT: Unremarkable Neck:  6 cm JVD, no thyromegally Lymphatics:  No adenopathy Back:  No CVA tenderness Lungs:  Clear with no wheezes HEART:  Regular rate rhythm, no murmurs, no rubs, no clicks Abd:  soft, positive bowel sounds, no organomegally, no rebound, no guarding Ext:  2 plus pulses, no edema, no cyanosis, no clubbing Skin:  No rashes no nodules Neuro:  CN II through XII intact, motor grossly intact  EKG - reviewed. See above.   Assess/Plan: 1. High grade heart block - he has daytime 3:2 and 2:1 AV block but is asymptomatic. I have recommended watchful waiting due to his lack of symptoms.  2. Arthritis -  he has severe arthritis in his hands, back, shoulders and feet. He will undergo watchful waiting.   William Mullins.D.

## 2018-03-14 DIAGNOSIS — L821 Other seborrheic keratosis: Secondary | ICD-10-CM | POA: Diagnosis not present

## 2018-03-14 DIAGNOSIS — L738 Other specified follicular disorders: Secondary | ICD-10-CM | POA: Diagnosis not present

## 2018-03-14 DIAGNOSIS — D485 Neoplasm of uncertain behavior of skin: Secondary | ICD-10-CM | POA: Diagnosis not present

## 2018-03-14 DIAGNOSIS — D492 Neoplasm of unspecified behavior of bone, soft tissue, and skin: Secondary | ICD-10-CM | POA: Diagnosis not present

## 2018-03-26 DIAGNOSIS — L570 Actinic keratosis: Secondary | ICD-10-CM | POA: Diagnosis not present

## 2018-03-26 DIAGNOSIS — D485 Neoplasm of uncertain behavior of skin: Secondary | ICD-10-CM | POA: Diagnosis not present

## 2018-04-15 DIAGNOSIS — Z6838 Body mass index (BMI) 38.0-38.9, adult: Secondary | ICD-10-CM | POA: Diagnosis not present

## 2018-04-15 DIAGNOSIS — M1991 Primary osteoarthritis, unspecified site: Secondary | ICD-10-CM | POA: Diagnosis not present

## 2018-08-02 DIAGNOSIS — L821 Other seborrheic keratosis: Secondary | ICD-10-CM | POA: Diagnosis not present

## 2018-08-02 DIAGNOSIS — L570 Actinic keratosis: Secondary | ICD-10-CM | POA: Diagnosis not present

## 2018-08-29 DIAGNOSIS — E1169 Type 2 diabetes mellitus with other specified complication: Secondary | ICD-10-CM | POA: Diagnosis not present

## 2018-08-29 DIAGNOSIS — Z0000 Encounter for general adult medical examination without abnormal findings: Secondary | ICD-10-CM | POA: Diagnosis not present

## 2018-08-29 DIAGNOSIS — Z7984 Long term (current) use of oral hypoglycemic drugs: Secondary | ICD-10-CM | POA: Diagnosis not present

## 2018-08-29 DIAGNOSIS — M109 Gout, unspecified: Secondary | ICD-10-CM | POA: Diagnosis not present

## 2018-08-29 DIAGNOSIS — I4430 Unspecified atrioventricular block: Secondary | ICD-10-CM | POA: Diagnosis not present

## 2018-08-29 DIAGNOSIS — E7800 Pure hypercholesterolemia, unspecified: Secondary | ICD-10-CM | POA: Diagnosis not present

## 2018-08-29 DIAGNOSIS — M1991 Primary osteoarthritis, unspecified site: Secondary | ICD-10-CM | POA: Diagnosis not present

## 2018-11-06 DIAGNOSIS — E7800 Pure hypercholesterolemia, unspecified: Secondary | ICD-10-CM | POA: Diagnosis not present

## 2018-11-06 DIAGNOSIS — Z0000 Encounter for general adult medical examination without abnormal findings: Secondary | ICD-10-CM | POA: Diagnosis not present

## 2018-11-06 DIAGNOSIS — M109 Gout, unspecified: Secondary | ICD-10-CM | POA: Diagnosis not present

## 2018-11-06 DIAGNOSIS — E1169 Type 2 diabetes mellitus with other specified complication: Secondary | ICD-10-CM | POA: Diagnosis not present

## 2018-11-06 DIAGNOSIS — I4430 Unspecified atrioventricular block: Secondary | ICD-10-CM | POA: Diagnosis not present

## 2018-11-06 DIAGNOSIS — M1991 Primary osteoarthritis, unspecified site: Secondary | ICD-10-CM | POA: Diagnosis not present

## 2018-11-06 DIAGNOSIS — Z23 Encounter for immunization: Secondary | ICD-10-CM | POA: Diagnosis not present

## 2018-11-06 DIAGNOSIS — Z7984 Long term (current) use of oral hypoglycemic drugs: Secondary | ICD-10-CM | POA: Diagnosis not present

## 2020-08-25 DIAGNOSIS — Z03818 Encounter for observation for suspected exposure to other biological agents ruled out: Secondary | ICD-10-CM | POA: Diagnosis not present

## 2020-08-30 DIAGNOSIS — E782 Mixed hyperlipidemia: Secondary | ICD-10-CM | POA: Diagnosis not present

## 2020-08-30 DIAGNOSIS — M1A9XX0 Chronic gout, unspecified, without tophus (tophi): Secondary | ICD-10-CM | POA: Diagnosis not present

## 2020-08-30 DIAGNOSIS — B372 Candidiasis of skin and nail: Secondary | ICD-10-CM | POA: Diagnosis not present

## 2020-08-30 DIAGNOSIS — I10 Essential (primary) hypertension: Secondary | ICD-10-CM | POA: Diagnosis not present

## 2020-08-30 DIAGNOSIS — E119 Type 2 diabetes mellitus without complications: Secondary | ICD-10-CM | POA: Diagnosis not present

## 2020-08-31 DIAGNOSIS — G4733 Obstructive sleep apnea (adult) (pediatric): Secondary | ICD-10-CM | POA: Diagnosis not present

## 2020-08-31 DIAGNOSIS — D649 Anemia, unspecified: Secondary | ICD-10-CM | POA: Diagnosis not present

## 2020-08-31 DIAGNOSIS — E119 Type 2 diabetes mellitus without complications: Secondary | ICD-10-CM | POA: Diagnosis not present

## 2020-09-01 DIAGNOSIS — M150 Primary generalized (osteo)arthritis: Secondary | ICD-10-CM | POA: Diagnosis not present

## 2020-09-01 DIAGNOSIS — E119 Type 2 diabetes mellitus without complications: Secondary | ICD-10-CM | POA: Diagnosis not present

## 2020-09-01 DIAGNOSIS — M1A9XX0 Chronic gout, unspecified, without tophus (tophi): Secondary | ICD-10-CM | POA: Diagnosis not present

## 2020-09-01 DIAGNOSIS — M545 Low back pain, unspecified: Secondary | ICD-10-CM | POA: Diagnosis not present

## 2020-09-01 DIAGNOSIS — I10 Essential (primary) hypertension: Secondary | ICD-10-CM | POA: Diagnosis not present

## 2020-09-01 DIAGNOSIS — M1000 Idiopathic gout, unspecified site: Secondary | ICD-10-CM | POA: Diagnosis not present

## 2020-09-01 DIAGNOSIS — E782 Mixed hyperlipidemia: Secondary | ICD-10-CM | POA: Diagnosis not present

## 2020-09-02 DIAGNOSIS — E782 Mixed hyperlipidemia: Secondary | ICD-10-CM | POA: Diagnosis not present

## 2020-09-02 DIAGNOSIS — E119 Type 2 diabetes mellitus without complications: Secondary | ICD-10-CM | POA: Diagnosis not present

## 2020-09-02 DIAGNOSIS — E559 Vitamin D deficiency, unspecified: Secondary | ICD-10-CM | POA: Diagnosis not present

## 2020-09-02 DIAGNOSIS — E039 Hypothyroidism, unspecified: Secondary | ICD-10-CM | POA: Diagnosis not present

## 2020-09-02 DIAGNOSIS — I10 Essential (primary) hypertension: Secondary | ICD-10-CM | POA: Diagnosis not present

## 2020-09-03 DIAGNOSIS — I10 Essential (primary) hypertension: Secondary | ICD-10-CM | POA: Diagnosis not present

## 2020-09-03 DIAGNOSIS — M545 Low back pain, unspecified: Secondary | ICD-10-CM | POA: Diagnosis not present

## 2020-09-03 DIAGNOSIS — E119 Type 2 diabetes mellitus without complications: Secondary | ICD-10-CM | POA: Diagnosis not present

## 2020-09-03 DIAGNOSIS — M150 Primary generalized (osteo)arthritis: Secondary | ICD-10-CM | POA: Diagnosis not present

## 2020-09-03 DIAGNOSIS — M1000 Idiopathic gout, unspecified site: Secondary | ICD-10-CM | POA: Diagnosis not present

## 2020-09-06 DIAGNOSIS — M545 Low back pain, unspecified: Secondary | ICD-10-CM | POA: Diagnosis not present

## 2020-09-06 DIAGNOSIS — M1000 Idiopathic gout, unspecified site: Secondary | ICD-10-CM | POA: Diagnosis not present

## 2020-09-06 DIAGNOSIS — M150 Primary generalized (osteo)arthritis: Secondary | ICD-10-CM | POA: Diagnosis not present

## 2020-09-06 DIAGNOSIS — I10 Essential (primary) hypertension: Secondary | ICD-10-CM | POA: Diagnosis not present

## 2020-09-06 DIAGNOSIS — E119 Type 2 diabetes mellitus without complications: Secondary | ICD-10-CM | POA: Diagnosis not present

## 2020-09-08 DIAGNOSIS — M545 Low back pain, unspecified: Secondary | ICD-10-CM | POA: Diagnosis not present

## 2020-09-08 DIAGNOSIS — M150 Primary generalized (osteo)arthritis: Secondary | ICD-10-CM | POA: Diagnosis not present

## 2020-09-08 DIAGNOSIS — Z03818 Encounter for observation for suspected exposure to other biological agents ruled out: Secondary | ICD-10-CM | POA: Diagnosis not present

## 2020-09-08 DIAGNOSIS — E119 Type 2 diabetes mellitus without complications: Secondary | ICD-10-CM | POA: Diagnosis not present

## 2020-09-08 DIAGNOSIS — M1000 Idiopathic gout, unspecified site: Secondary | ICD-10-CM | POA: Diagnosis not present

## 2020-09-08 DIAGNOSIS — I10 Essential (primary) hypertension: Secondary | ICD-10-CM | POA: Diagnosis not present

## 2020-09-10 DIAGNOSIS — L605 Yellow nail syndrome: Secondary | ICD-10-CM | POA: Diagnosis not present

## 2020-09-10 DIAGNOSIS — M1000 Idiopathic gout, unspecified site: Secondary | ICD-10-CM | POA: Diagnosis not present

## 2020-09-10 DIAGNOSIS — M545 Low back pain, unspecified: Secondary | ICD-10-CM | POA: Diagnosis not present

## 2020-09-10 DIAGNOSIS — E119 Type 2 diabetes mellitus without complications: Secondary | ICD-10-CM | POA: Diagnosis not present

## 2020-09-10 DIAGNOSIS — I10 Essential (primary) hypertension: Secondary | ICD-10-CM | POA: Diagnosis not present

## 2020-09-10 DIAGNOSIS — B070 Plantar wart: Secondary | ICD-10-CM | POA: Diagnosis not present

## 2020-09-10 DIAGNOSIS — R2681 Unsteadiness on feet: Secondary | ICD-10-CM | POA: Diagnosis not present

## 2020-09-10 DIAGNOSIS — M150 Primary generalized (osteo)arthritis: Secondary | ICD-10-CM | POA: Diagnosis not present

## 2020-09-10 DIAGNOSIS — L851 Acquired keratosis [keratoderma] palmaris et plantaris: Secondary | ICD-10-CM | POA: Diagnosis not present

## 2020-09-10 DIAGNOSIS — E1140 Type 2 diabetes mellitus with diabetic neuropathy, unspecified: Secondary | ICD-10-CM | POA: Diagnosis not present

## 2020-09-10 DIAGNOSIS — M79673 Pain in unspecified foot: Secondary | ICD-10-CM | POA: Diagnosis not present

## 2020-09-10 DIAGNOSIS — B351 Tinea unguium: Secondary | ICD-10-CM | POA: Diagnosis not present

## 2020-09-13 DIAGNOSIS — M545 Low back pain, unspecified: Secondary | ICD-10-CM | POA: Diagnosis not present

## 2020-09-13 DIAGNOSIS — E119 Type 2 diabetes mellitus without complications: Secondary | ICD-10-CM | POA: Diagnosis not present

## 2020-09-13 DIAGNOSIS — I10 Essential (primary) hypertension: Secondary | ICD-10-CM | POA: Diagnosis not present

## 2020-09-13 DIAGNOSIS — M150 Primary generalized (osteo)arthritis: Secondary | ICD-10-CM | POA: Diagnosis not present

## 2020-09-13 DIAGNOSIS — M1000 Idiopathic gout, unspecified site: Secondary | ICD-10-CM | POA: Diagnosis not present

## 2020-09-14 DIAGNOSIS — I10 Essential (primary) hypertension: Secondary | ICD-10-CM | POA: Diagnosis not present

## 2020-09-14 DIAGNOSIS — M545 Low back pain, unspecified: Secondary | ICD-10-CM | POA: Diagnosis not present

## 2020-09-14 DIAGNOSIS — M1000 Idiopathic gout, unspecified site: Secondary | ICD-10-CM | POA: Diagnosis not present

## 2020-09-14 DIAGNOSIS — E119 Type 2 diabetes mellitus without complications: Secondary | ICD-10-CM | POA: Diagnosis not present

## 2020-09-14 DIAGNOSIS — M150 Primary generalized (osteo)arthritis: Secondary | ICD-10-CM | POA: Diagnosis not present

## 2020-09-15 DIAGNOSIS — I10 Essential (primary) hypertension: Secondary | ICD-10-CM | POA: Diagnosis not present

## 2020-09-15 DIAGNOSIS — E119 Type 2 diabetes mellitus without complications: Secondary | ICD-10-CM | POA: Diagnosis not present

## 2020-09-15 DIAGNOSIS — M150 Primary generalized (osteo)arthritis: Secondary | ICD-10-CM | POA: Diagnosis not present

## 2020-09-15 DIAGNOSIS — M1000 Idiopathic gout, unspecified site: Secondary | ICD-10-CM | POA: Diagnosis not present

## 2020-09-15 DIAGNOSIS — M545 Low back pain, unspecified: Secondary | ICD-10-CM | POA: Diagnosis not present

## 2020-09-16 DIAGNOSIS — I10 Essential (primary) hypertension: Secondary | ICD-10-CM | POA: Diagnosis not present

## 2020-09-16 DIAGNOSIS — M545 Low back pain, unspecified: Secondary | ICD-10-CM | POA: Diagnosis not present

## 2020-09-16 DIAGNOSIS — M150 Primary generalized (osteo)arthritis: Secondary | ICD-10-CM | POA: Diagnosis not present

## 2020-09-16 DIAGNOSIS — M1000 Idiopathic gout, unspecified site: Secondary | ICD-10-CM | POA: Diagnosis not present

## 2020-09-16 DIAGNOSIS — E119 Type 2 diabetes mellitus without complications: Secondary | ICD-10-CM | POA: Diagnosis not present

## 2020-09-20 DIAGNOSIS — M545 Low back pain, unspecified: Secondary | ICD-10-CM | POA: Diagnosis not present

## 2020-09-20 DIAGNOSIS — M1000 Idiopathic gout, unspecified site: Secondary | ICD-10-CM | POA: Diagnosis not present

## 2020-09-20 DIAGNOSIS — I10 Essential (primary) hypertension: Secondary | ICD-10-CM | POA: Diagnosis not present

## 2020-09-20 DIAGNOSIS — E119 Type 2 diabetes mellitus without complications: Secondary | ICD-10-CM | POA: Diagnosis not present

## 2020-09-20 DIAGNOSIS — M150 Primary generalized (osteo)arthritis: Secondary | ICD-10-CM | POA: Diagnosis not present

## 2020-09-22 DIAGNOSIS — M545 Low back pain, unspecified: Secondary | ICD-10-CM | POA: Diagnosis not present

## 2020-09-22 DIAGNOSIS — M150 Primary generalized (osteo)arthritis: Secondary | ICD-10-CM | POA: Diagnosis not present

## 2020-09-22 DIAGNOSIS — I10 Essential (primary) hypertension: Secondary | ICD-10-CM | POA: Diagnosis not present

## 2020-09-22 DIAGNOSIS — M1000 Idiopathic gout, unspecified site: Secondary | ICD-10-CM | POA: Diagnosis not present

## 2020-09-22 DIAGNOSIS — E119 Type 2 diabetes mellitus without complications: Secondary | ICD-10-CM | POA: Diagnosis not present

## 2020-09-27 DIAGNOSIS — I10 Essential (primary) hypertension: Secondary | ICD-10-CM | POA: Diagnosis not present

## 2020-09-27 DIAGNOSIS — E119 Type 2 diabetes mellitus without complications: Secondary | ICD-10-CM | POA: Diagnosis not present

## 2020-09-27 DIAGNOSIS — M150 Primary generalized (osteo)arthritis: Secondary | ICD-10-CM | POA: Diagnosis not present

## 2020-09-27 DIAGNOSIS — M545 Low back pain, unspecified: Secondary | ICD-10-CM | POA: Diagnosis not present

## 2020-09-27 DIAGNOSIS — M1A9XX0 Chronic gout, unspecified, without tophus (tophi): Secondary | ICD-10-CM | POA: Diagnosis not present

## 2020-09-27 DIAGNOSIS — E782 Mixed hyperlipidemia: Secondary | ICD-10-CM | POA: Diagnosis not present

## 2020-09-27 DIAGNOSIS — M1000 Idiopathic gout, unspecified site: Secondary | ICD-10-CM | POA: Diagnosis not present

## 2020-09-29 DIAGNOSIS — E119 Type 2 diabetes mellitus without complications: Secondary | ICD-10-CM | POA: Diagnosis not present

## 2020-09-29 DIAGNOSIS — I10 Essential (primary) hypertension: Secondary | ICD-10-CM | POA: Diagnosis not present

## 2020-09-29 DIAGNOSIS — M150 Primary generalized (osteo)arthritis: Secondary | ICD-10-CM | POA: Diagnosis not present

## 2020-09-29 DIAGNOSIS — M1000 Idiopathic gout, unspecified site: Secondary | ICD-10-CM | POA: Diagnosis not present

## 2020-09-29 DIAGNOSIS — M545 Low back pain, unspecified: Secondary | ICD-10-CM | POA: Diagnosis not present

## 2020-10-04 DIAGNOSIS — M545 Low back pain, unspecified: Secondary | ICD-10-CM | POA: Diagnosis not present

## 2020-10-04 DIAGNOSIS — M1000 Idiopathic gout, unspecified site: Secondary | ICD-10-CM | POA: Diagnosis not present

## 2020-10-04 DIAGNOSIS — E119 Type 2 diabetes mellitus without complications: Secondary | ICD-10-CM | POA: Diagnosis not present

## 2020-10-04 DIAGNOSIS — I10 Essential (primary) hypertension: Secondary | ICD-10-CM | POA: Diagnosis not present

## 2020-10-04 DIAGNOSIS — M150 Primary generalized (osteo)arthritis: Secondary | ICD-10-CM | POA: Diagnosis not present

## 2020-10-06 DIAGNOSIS — M1000 Idiopathic gout, unspecified site: Secondary | ICD-10-CM | POA: Diagnosis not present

## 2020-10-06 DIAGNOSIS — I10 Essential (primary) hypertension: Secondary | ICD-10-CM | POA: Diagnosis not present

## 2020-10-06 DIAGNOSIS — M545 Low back pain, unspecified: Secondary | ICD-10-CM | POA: Diagnosis not present

## 2020-10-06 DIAGNOSIS — E119 Type 2 diabetes mellitus without complications: Secondary | ICD-10-CM | POA: Diagnosis not present

## 2020-10-06 DIAGNOSIS — M150 Primary generalized (osteo)arthritis: Secondary | ICD-10-CM | POA: Diagnosis not present

## 2020-10-07 DIAGNOSIS — I10 Essential (primary) hypertension: Secondary | ICD-10-CM | POA: Diagnosis not present

## 2020-10-07 DIAGNOSIS — E119 Type 2 diabetes mellitus without complications: Secondary | ICD-10-CM | POA: Diagnosis not present

## 2020-10-07 DIAGNOSIS — M150 Primary generalized (osteo)arthritis: Secondary | ICD-10-CM | POA: Diagnosis not present

## 2020-10-11 DIAGNOSIS — I10 Essential (primary) hypertension: Secondary | ICD-10-CM | POA: Diagnosis not present

## 2020-10-11 DIAGNOSIS — E119 Type 2 diabetes mellitus without complications: Secondary | ICD-10-CM | POA: Diagnosis not present

## 2020-10-11 DIAGNOSIS — M545 Low back pain, unspecified: Secondary | ICD-10-CM | POA: Diagnosis not present

## 2020-10-11 DIAGNOSIS — M1000 Idiopathic gout, unspecified site: Secondary | ICD-10-CM | POA: Diagnosis not present

## 2020-10-11 DIAGNOSIS — M150 Primary generalized (osteo)arthritis: Secondary | ICD-10-CM | POA: Diagnosis not present

## 2020-10-13 DIAGNOSIS — I10 Essential (primary) hypertension: Secondary | ICD-10-CM | POA: Diagnosis not present

## 2020-10-13 DIAGNOSIS — E119 Type 2 diabetes mellitus without complications: Secondary | ICD-10-CM | POA: Diagnosis not present

## 2020-10-13 DIAGNOSIS — M1000 Idiopathic gout, unspecified site: Secondary | ICD-10-CM | POA: Diagnosis not present

## 2020-10-13 DIAGNOSIS — M545 Low back pain, unspecified: Secondary | ICD-10-CM | POA: Diagnosis not present

## 2020-10-13 DIAGNOSIS — M150 Primary generalized (osteo)arthritis: Secondary | ICD-10-CM | POA: Diagnosis not present

## 2020-10-19 DIAGNOSIS — I10 Essential (primary) hypertension: Secondary | ICD-10-CM | POA: Diagnosis not present

## 2020-10-19 DIAGNOSIS — M150 Primary generalized (osteo)arthritis: Secondary | ICD-10-CM | POA: Diagnosis not present

## 2020-10-19 DIAGNOSIS — E1169 Type 2 diabetes mellitus with other specified complication: Secondary | ICD-10-CM | POA: Diagnosis not present

## 2020-10-19 DIAGNOSIS — M1000 Idiopathic gout, unspecified site: Secondary | ICD-10-CM | POA: Diagnosis not present

## 2020-10-19 DIAGNOSIS — D649 Anemia, unspecified: Secondary | ICD-10-CM | POA: Diagnosis not present

## 2020-10-19 DIAGNOSIS — E1140 Type 2 diabetes mellitus with diabetic neuropathy, unspecified: Secondary | ICD-10-CM | POA: Diagnosis not present

## 2020-10-19 DIAGNOSIS — E119 Type 2 diabetes mellitus without complications: Secondary | ICD-10-CM | POA: Diagnosis not present

## 2020-10-19 DIAGNOSIS — M545 Low back pain, unspecified: Secondary | ICD-10-CM | POA: Diagnosis not present

## 2020-10-19 DIAGNOSIS — E7800 Pure hypercholesterolemia, unspecified: Secondary | ICD-10-CM | POA: Diagnosis not present

## 2020-10-19 DIAGNOSIS — E785 Hyperlipidemia, unspecified: Secondary | ICD-10-CM | POA: Diagnosis not present

## 2020-10-19 DIAGNOSIS — M1991 Primary osteoarthritis, unspecified site: Secondary | ICD-10-CM | POA: Diagnosis not present

## 2020-10-21 DIAGNOSIS — M545 Low back pain, unspecified: Secondary | ICD-10-CM | POA: Diagnosis not present

## 2020-10-21 DIAGNOSIS — E119 Type 2 diabetes mellitus without complications: Secondary | ICD-10-CM | POA: Diagnosis not present

## 2020-10-21 DIAGNOSIS — M150 Primary generalized (osteo)arthritis: Secondary | ICD-10-CM | POA: Diagnosis not present

## 2020-10-21 DIAGNOSIS — I10 Essential (primary) hypertension: Secondary | ICD-10-CM | POA: Diagnosis not present

## 2020-10-21 DIAGNOSIS — M1000 Idiopathic gout, unspecified site: Secondary | ICD-10-CM | POA: Diagnosis not present

## 2020-10-26 DIAGNOSIS — M545 Low back pain, unspecified: Secondary | ICD-10-CM | POA: Diagnosis not present

## 2020-10-26 DIAGNOSIS — E782 Mixed hyperlipidemia: Secondary | ICD-10-CM | POA: Diagnosis not present

## 2020-10-26 DIAGNOSIS — I10 Essential (primary) hypertension: Secondary | ICD-10-CM | POA: Diagnosis not present

## 2020-10-26 DIAGNOSIS — M1A9XX0 Chronic gout, unspecified, without tophus (tophi): Secondary | ICD-10-CM | POA: Diagnosis not present

## 2020-10-26 DIAGNOSIS — M150 Primary generalized (osteo)arthritis: Secondary | ICD-10-CM | POA: Diagnosis not present

## 2020-10-26 DIAGNOSIS — E119 Type 2 diabetes mellitus without complications: Secondary | ICD-10-CM | POA: Diagnosis not present

## 2020-10-26 DIAGNOSIS — B372 Candidiasis of skin and nail: Secondary | ICD-10-CM | POA: Diagnosis not present

## 2020-10-26 DIAGNOSIS — M1000 Idiopathic gout, unspecified site: Secondary | ICD-10-CM | POA: Diagnosis not present

## 2020-10-27 DIAGNOSIS — E1169 Type 2 diabetes mellitus with other specified complication: Secondary | ICD-10-CM | POA: Diagnosis not present

## 2020-10-27 DIAGNOSIS — E1140 Type 2 diabetes mellitus with diabetic neuropathy, unspecified: Secondary | ICD-10-CM | POA: Diagnosis not present

## 2020-10-27 DIAGNOSIS — D649 Anemia, unspecified: Secondary | ICD-10-CM | POA: Diagnosis not present

## 2020-10-27 DIAGNOSIS — E785 Hyperlipidemia, unspecified: Secondary | ICD-10-CM | POA: Diagnosis not present

## 2020-10-27 DIAGNOSIS — I10 Essential (primary) hypertension: Secondary | ICD-10-CM | POA: Diagnosis not present

## 2020-10-27 DIAGNOSIS — M1991 Primary osteoarthritis, unspecified site: Secondary | ICD-10-CM | POA: Diagnosis not present

## 2020-10-27 DIAGNOSIS — E7800 Pure hypercholesterolemia, unspecified: Secondary | ICD-10-CM | POA: Diagnosis not present

## 2020-10-29 DIAGNOSIS — M150 Primary generalized (osteo)arthritis: Secondary | ICD-10-CM | POA: Diagnosis not present

## 2020-10-29 DIAGNOSIS — E119 Type 2 diabetes mellitus without complications: Secondary | ICD-10-CM | POA: Diagnosis not present

## 2020-10-29 DIAGNOSIS — M545 Low back pain, unspecified: Secondary | ICD-10-CM | POA: Diagnosis not present

## 2020-10-29 DIAGNOSIS — I10 Essential (primary) hypertension: Secondary | ICD-10-CM | POA: Diagnosis not present

## 2020-10-29 DIAGNOSIS — M1000 Idiopathic gout, unspecified site: Secondary | ICD-10-CM | POA: Diagnosis not present

## 2020-11-01 DIAGNOSIS — I10 Essential (primary) hypertension: Secondary | ICD-10-CM | POA: Diagnosis not present

## 2020-11-01 DIAGNOSIS — M545 Low back pain, unspecified: Secondary | ICD-10-CM | POA: Diagnosis not present

## 2020-11-01 DIAGNOSIS — M1000 Idiopathic gout, unspecified site: Secondary | ICD-10-CM | POA: Diagnosis not present

## 2020-11-01 DIAGNOSIS — M150 Primary generalized (osteo)arthritis: Secondary | ICD-10-CM | POA: Diagnosis not present

## 2020-11-01 DIAGNOSIS — E119 Type 2 diabetes mellitus without complications: Secondary | ICD-10-CM | POA: Diagnosis not present

## 2020-11-03 DIAGNOSIS — M1000 Idiopathic gout, unspecified site: Secondary | ICD-10-CM | POA: Diagnosis not present

## 2020-11-03 DIAGNOSIS — E119 Type 2 diabetes mellitus without complications: Secondary | ICD-10-CM | POA: Diagnosis not present

## 2020-11-03 DIAGNOSIS — I10 Essential (primary) hypertension: Secondary | ICD-10-CM | POA: Diagnosis not present

## 2020-11-03 DIAGNOSIS — M545 Low back pain, unspecified: Secondary | ICD-10-CM | POA: Diagnosis not present

## 2020-11-03 DIAGNOSIS — M150 Primary generalized (osteo)arthritis: Secondary | ICD-10-CM | POA: Diagnosis not present

## 2020-11-08 DIAGNOSIS — M1000 Idiopathic gout, unspecified site: Secondary | ICD-10-CM | POA: Diagnosis not present

## 2020-11-08 DIAGNOSIS — I10 Essential (primary) hypertension: Secondary | ICD-10-CM | POA: Diagnosis not present

## 2020-11-08 DIAGNOSIS — M150 Primary generalized (osteo)arthritis: Secondary | ICD-10-CM | POA: Diagnosis not present

## 2020-11-08 DIAGNOSIS — E119 Type 2 diabetes mellitus without complications: Secondary | ICD-10-CM | POA: Diagnosis not present

## 2020-11-08 DIAGNOSIS — M545 Low back pain, unspecified: Secondary | ICD-10-CM | POA: Diagnosis not present

## 2020-11-10 DIAGNOSIS — E119 Type 2 diabetes mellitus without complications: Secondary | ICD-10-CM | POA: Diagnosis not present

## 2020-11-10 DIAGNOSIS — M1000 Idiopathic gout, unspecified site: Secondary | ICD-10-CM | POA: Diagnosis not present

## 2020-11-10 DIAGNOSIS — M545 Low back pain, unspecified: Secondary | ICD-10-CM | POA: Diagnosis not present

## 2020-11-10 DIAGNOSIS — I10 Essential (primary) hypertension: Secondary | ICD-10-CM | POA: Diagnosis not present

## 2020-11-10 DIAGNOSIS — M150 Primary generalized (osteo)arthritis: Secondary | ICD-10-CM | POA: Diagnosis not present

## 2020-11-15 DIAGNOSIS — M545 Low back pain, unspecified: Secondary | ICD-10-CM | POA: Diagnosis not present

## 2020-11-15 DIAGNOSIS — E119 Type 2 diabetes mellitus without complications: Secondary | ICD-10-CM | POA: Diagnosis not present

## 2020-11-15 DIAGNOSIS — M1000 Idiopathic gout, unspecified site: Secondary | ICD-10-CM | POA: Diagnosis not present

## 2020-11-15 DIAGNOSIS — M150 Primary generalized (osteo)arthritis: Secondary | ICD-10-CM | POA: Diagnosis not present

## 2020-11-15 DIAGNOSIS — I10 Essential (primary) hypertension: Secondary | ICD-10-CM | POA: Diagnosis not present

## 2020-11-17 DIAGNOSIS — B351 Tinea unguium: Secondary | ICD-10-CM | POA: Diagnosis not present

## 2020-11-17 DIAGNOSIS — M1000 Idiopathic gout, unspecified site: Secondary | ICD-10-CM | POA: Diagnosis not present

## 2020-11-17 DIAGNOSIS — M150 Primary generalized (osteo)arthritis: Secondary | ICD-10-CM | POA: Diagnosis not present

## 2020-11-17 DIAGNOSIS — M79673 Pain in unspecified foot: Secondary | ICD-10-CM | POA: Diagnosis not present

## 2020-11-17 DIAGNOSIS — R2681 Unsteadiness on feet: Secondary | ICD-10-CM | POA: Diagnosis not present

## 2020-11-17 DIAGNOSIS — E119 Type 2 diabetes mellitus without complications: Secondary | ICD-10-CM | POA: Diagnosis not present

## 2020-11-17 DIAGNOSIS — E1140 Type 2 diabetes mellitus with diabetic neuropathy, unspecified: Secondary | ICD-10-CM | POA: Diagnosis not present

## 2020-11-17 DIAGNOSIS — I10 Essential (primary) hypertension: Secondary | ICD-10-CM | POA: Diagnosis not present

## 2020-11-17 DIAGNOSIS — M545 Low back pain, unspecified: Secondary | ICD-10-CM | POA: Diagnosis not present

## 2020-11-17 DIAGNOSIS — L603 Nail dystrophy: Secondary | ICD-10-CM | POA: Diagnosis not present

## 2020-11-22 DIAGNOSIS — M150 Primary generalized (osteo)arthritis: Secondary | ICD-10-CM | POA: Diagnosis not present

## 2020-11-22 DIAGNOSIS — I10 Essential (primary) hypertension: Secondary | ICD-10-CM | POA: Diagnosis not present

## 2020-11-22 DIAGNOSIS — M1000 Idiopathic gout, unspecified site: Secondary | ICD-10-CM | POA: Diagnosis not present

## 2020-11-22 DIAGNOSIS — M545 Low back pain, unspecified: Secondary | ICD-10-CM | POA: Diagnosis not present

## 2020-11-22 DIAGNOSIS — E119 Type 2 diabetes mellitus without complications: Secondary | ICD-10-CM | POA: Diagnosis not present

## 2020-11-23 DIAGNOSIS — M150 Primary generalized (osteo)arthritis: Secondary | ICD-10-CM | POA: Diagnosis not present

## 2020-11-23 DIAGNOSIS — M1000 Idiopathic gout, unspecified site: Secondary | ICD-10-CM | POA: Diagnosis not present

## 2020-11-23 DIAGNOSIS — E119 Type 2 diabetes mellitus without complications: Secondary | ICD-10-CM | POA: Diagnosis not present

## 2020-11-24 DIAGNOSIS — M1000 Idiopathic gout, unspecified site: Secondary | ICD-10-CM | POA: Diagnosis not present

## 2020-11-24 DIAGNOSIS — I10 Essential (primary) hypertension: Secondary | ICD-10-CM | POA: Diagnosis not present

## 2020-11-24 DIAGNOSIS — M150 Primary generalized (osteo)arthritis: Secondary | ICD-10-CM | POA: Diagnosis not present

## 2020-11-24 DIAGNOSIS — E119 Type 2 diabetes mellitus without complications: Secondary | ICD-10-CM | POA: Diagnosis not present

## 2020-11-24 DIAGNOSIS — M545 Low back pain, unspecified: Secondary | ICD-10-CM | POA: Diagnosis not present

## 2020-11-29 DIAGNOSIS — M545 Low back pain, unspecified: Secondary | ICD-10-CM | POA: Diagnosis not present

## 2020-11-29 DIAGNOSIS — I10 Essential (primary) hypertension: Secondary | ICD-10-CM | POA: Diagnosis not present

## 2020-11-29 DIAGNOSIS — M150 Primary generalized (osteo)arthritis: Secondary | ICD-10-CM | POA: Diagnosis not present

## 2020-11-29 DIAGNOSIS — M1000 Idiopathic gout, unspecified site: Secondary | ICD-10-CM | POA: Diagnosis not present

## 2020-11-29 DIAGNOSIS — E119 Type 2 diabetes mellitus without complications: Secondary | ICD-10-CM | POA: Diagnosis not present

## 2020-12-02 ENCOUNTER — Emergency Department (HOSPITAL_COMMUNITY): Payer: Medicare Other

## 2020-12-02 ENCOUNTER — Emergency Department (HOSPITAL_COMMUNITY)
Admit: 2020-12-02 | Discharge: 2020-12-02 | Disposition: A | Discharge status: Home / Self Care | Payer: Medicare Other | Attending: Cardiology | Admitting: Cardiology

## 2020-12-02 ENCOUNTER — Encounter (HOSPITAL_COMMUNITY): Payer: Self-pay | Admitting: Emergency Medicine

## 2020-12-02 ENCOUNTER — Emergency Department (HOSPITAL_COMMUNITY)
Admission: EM | Admit: 2020-12-02 | Discharge: 2020-12-02 | Disposition: A | Discharge status: Home / Self Care | Payer: Medicare Other | Attending: Emergency Medicine | Admitting: Emergency Medicine

## 2020-12-02 ENCOUNTER — Other Ambulatory Visit: Payer: Self-pay | Admitting: *Deleted

## 2020-12-02 ENCOUNTER — Other Ambulatory Visit: Payer: Self-pay

## 2020-12-02 DIAGNOSIS — W19XXXA Unspecified fall, initial encounter: Secondary | ICD-10-CM

## 2020-12-02 DIAGNOSIS — W050XXA Fall from non-moving wheelchair, initial encounter: Secondary | ICD-10-CM | POA: Insufficient documentation

## 2020-12-02 DIAGNOSIS — R404 Transient alteration of awareness: Secondary | ICD-10-CM | POA: Diagnosis not present

## 2020-12-02 DIAGNOSIS — Z7982 Long term (current) use of aspirin: Secondary | ICD-10-CM | POA: Insufficient documentation

## 2020-12-02 DIAGNOSIS — E119 Type 2 diabetes mellitus without complications: Secondary | ICD-10-CM | POA: Insufficient documentation

## 2020-12-02 DIAGNOSIS — Z96612 Presence of left artificial shoulder joint: Secondary | ICD-10-CM | POA: Diagnosis not present

## 2020-12-02 DIAGNOSIS — S0990XA Unspecified injury of head, initial encounter: Secondary | ICD-10-CM | POA: Diagnosis not present

## 2020-12-02 DIAGNOSIS — I441 Atrioventricular block, second degree: Secondary | ICD-10-CM | POA: Diagnosis not present

## 2020-12-02 DIAGNOSIS — G319 Degenerative disease of nervous system, unspecified: Secondary | ICD-10-CM | POA: Diagnosis not present

## 2020-12-02 DIAGNOSIS — R001 Bradycardia, unspecified: Secondary | ICD-10-CM | POA: Diagnosis not present

## 2020-12-02 DIAGNOSIS — Z79899 Other long term (current) drug therapy: Secondary | ICD-10-CM | POA: Insufficient documentation

## 2020-12-02 DIAGNOSIS — Z7984 Long term (current) use of oral hypoglycemic drugs: Secondary | ICD-10-CM | POA: Insufficient documentation

## 2020-12-02 DIAGNOSIS — Z96611 Presence of right artificial shoulder joint: Secondary | ICD-10-CM | POA: Diagnosis not present

## 2020-12-02 DIAGNOSIS — I10 Essential (primary) hypertension: Secondary | ICD-10-CM | POA: Diagnosis not present

## 2020-12-02 DIAGNOSIS — J9811 Atelectasis: Secondary | ICD-10-CM | POA: Diagnosis not present

## 2020-12-02 DIAGNOSIS — R0902 Hypoxemia: Secondary | ICD-10-CM | POA: Diagnosis not present

## 2020-12-02 DIAGNOSIS — R4182 Altered mental status, unspecified: Secondary | ICD-10-CM | POA: Diagnosis present

## 2020-12-02 DIAGNOSIS — Z743 Need for continuous supervision: Secondary | ICD-10-CM | POA: Diagnosis not present

## 2020-12-02 LAB — CBC WITH DIFFERENTIAL/PLATELET
Abs Immature Granulocytes: 0.03 10*3/uL (ref 0.00–0.07)
Basophils Absolute: 0.1 10*3/uL (ref 0.0–0.1)
Basophils Relative: 1 %
Eosinophils Absolute: 0.4 10*3/uL (ref 0.0–0.5)
Eosinophils Relative: 4 %
HCT: 40.6 % (ref 39.0–52.0)
Hemoglobin: 13 g/dL (ref 13.0–17.0)
Immature Granulocytes: 0 %
Lymphocytes Relative: 24 %
Lymphs Abs: 2.6 10*3/uL (ref 0.7–4.0)
MCH: 29.9 pg (ref 26.0–34.0)
MCHC: 32 g/dL (ref 30.0–36.0)
MCV: 93.3 fL (ref 80.0–100.0)
Monocytes Absolute: 0.8 10*3/uL (ref 0.1–1.0)
Monocytes Relative: 7 %
Neutro Abs: 6.9 10*3/uL (ref 1.7–7.7)
Neutrophils Relative %: 64 %
Platelets: 243 10*3/uL (ref 150–400)
RBC: 4.35 MIL/uL (ref 4.22–5.81)
RDW: 15.2 % (ref 11.5–15.5)
WBC: 10.8 10*3/uL — ABNORMAL HIGH (ref 4.0–10.5)
nRBC: 0 % (ref 0.0–0.2)

## 2020-12-02 LAB — COMPREHENSIVE METABOLIC PANEL
ALT: 18 U/L (ref 0–44)
AST: 23 U/L (ref 15–41)
Albumin: 3.5 g/dL (ref 3.5–5.0)
Alkaline Phosphatase: 99 U/L (ref 38–126)
Anion gap: 7 (ref 5–15)
BUN: 35 mg/dL — ABNORMAL HIGH (ref 8–23)
CO2: 24 mmol/L (ref 22–32)
Calcium: 9.1 mg/dL (ref 8.9–10.3)
Chloride: 107 mmol/L (ref 98–111)
Creatinine, Ser: 1.24 mg/dL (ref 0.61–1.24)
GFR, Estimated: 56 mL/min — ABNORMAL LOW (ref >60)
Glucose, Bld: 110 mg/dL — ABNORMAL HIGH (ref 70–99)
Potassium: 5.2 mmol/L — ABNORMAL HIGH (ref 3.5–5.1)
Sodium: 138 mmol/L (ref 135–145)
Total Bilirubin: 0.4 mg/dL (ref 0.3–1.2)
Total Protein: 7.8 g/dL (ref 6.5–8.1)

## 2020-12-02 LAB — TROPONIN I (HIGH SENSITIVITY)
Troponin I (High Sensitivity): 32 ng/L — ABNORMAL HIGH (ref <18)
Troponin I (High Sensitivity): 27 ng/L — ABNORMAL HIGH (ref <18)

## 2020-12-02 LAB — MAGNESIUM: Magnesium: 1.7 mg/dL (ref 1.7–2.4)

## 2020-12-02 MED ORDER — SODIUM ZIRCONIUM CYCLOSILICATE 10 G PO PACK
10.0000 g | PACK | Freq: Every day | ORAL | Status: DC
  Administered 2020-12-02: 10 g via ORAL
  Filled 2020-12-02: qty 1

## 2020-12-02 NOTE — ED Provider Notes (Signed)
[facility] EMERGENCY DEPARTMENT Provider Note   CSN: [id number] Arrival date & time: 12/02/20  6283     History Chief Complaint  Patient presents with   Fall   Altered Mental Status    William Mullins is a 87 y.o. male.  HPI     [place] [address] out of chair, wife says she was in the other room, report was he landed on his bottom and was witnessed Patient reports he woke up on the floor, does not remember how he got there, that he was sleeping and rolled out Per son has not been quite right since fall a few weeks ago, not sure if aging  No known hx of dementia, was joking and not answering questions appropriately on EMS arrival, and given this is second fall, they decided to transport him to hospital  Denies new difficulty speaking, numbness, weakness one-sided the other, visual changes.  Denies chest pain, shortness of breath, dizziness or lightheadedness, other recent illness, fever, black or bloody stools or other concern    Past Medical History:  Diagnosis Date   Arthritis    Diabetes mellitus without complication ([place])    Hypertension     There are no problems to display for this patient.   Past Surgical History:  Procedure Laterality Date   JOINT REPLACEMENT Bilateral    SHOULDER SURGERY         Family History  Problem Relation Age of Onset   Hypertension Father     Social History   Tobacco Use   Smoking status: Never   Smokeless tobacco: Never  Vaping Use   Vaping Use: Never used  Substance Use Topics   Alcohol use: No   Drug use: No    Home Medications Prior to Admission medications   Medication Sig Start Date End Date Taking? Authorizing Provider  allopurinol (ZYLOPRIM) 100 MG tablet Take 100 mg by mouth daily. 03/17/15  Yes [provider]  amLODipine (NORVASC) 5 MG tablet Take 5 mg by mouth daily.   Yes [provider]  Ascorbic Acid (VITAMIN C) 1000 MG tablet Take 1,000 mg by mouth daily.   Yes [provider]  aspirin EC 81 MG tablet Take 81 mg by mouth daily.   Yes [provider]  atorvastatin (LIPITOR) 10 MG tablet Take 10 mg by mouth at bedtime.   Yes [provider]  benazepril (LOTENSIN) 10 MG tablet Take 10 mg by mouth daily.   Yes [provider]  Ergocalciferol (VITAMIN D2) 50 MCG (2000 UT) TABS Take 2,000 Units by mouth daily.   Yes [provider]  Melatonin 10 MG TABS Take 10 mg by mouth at bedtime.   Yes [provider]  metFORMIN (GLUCOPHAGE) 500 MG tablet Take 500 mg by mouth daily.   Yes [provider]  Multiple Vitamin (MULTIVITAMIN WITH MINERALS) TABS tablet Take 1 tablet by mouth daily.   Yes [provider]  nystatin cream (MYCOSTATIN) Apply 1 application topically 2 (two) times daily. To groin area   Yes [provider]  oxyCODONE-acetaminophen (PERCOCET/ROXICET) 5-325 MG tablet Take by mouth every 8 (eight) hours as needed for severe pain.   Yes [provider]  vitamin E 400 UNIT capsule Take 400 Units by mouth daily.   Yes [provider]    Allergies    Patient has no known allergies.  Review of Systems   Review of Systems  Constitutional:  Negative for fever.  HENT:  Negative  for sore throat.   Eyes:  Negative for visual disturbance.  Respiratory:  Negative for shortness of breath.   Cardiovascular:  Negative for chest pain.  Gastrointestinal:  Negative for abdominal pain, nausea and vomiting.  Genitourinary:  Negative for difficulty urinating.  Musculoskeletal:  Negative for back pain and neck stiffness.  Skin:  Negative for rash.  Neurological:  Negative for syncope (not sure, fell out of chair and does not remember), facial asymmetry, weakness, numbness and headaches. Speech difficulty: reports for past year he sometimes stumbles over words and gets tongue tied.  Physical Exam Updated Vital Signs BP 127/78   Pulse (!) 54   Temp (!) 97.5 F (36.4 C) (Oral)    Resp (!) 9   Ht 5\' 4"  (1.626 m)   Wt 103.9 kg   SpO2 94%   BMI 39.32 kg/m   Physical Exam Vitals and nursing note reviewed.  Constitutional:      General: He is not in acute distress.    Appearance: He is well-developed. He is not diaphoretic.  HENT:     Head: Normocephalic and atraumatic.  Eyes:     Conjunctiva/sclera: Conjunctivae normal.  Cardiovascular:     Rate and Rhythm: Normal rate and regular rhythm.     Heart sounds: Normal heart sounds. No murmur heard.   No friction rub. No gallop.  Pulmonary:     Effort: Pulmonary effort is normal. No respiratory distress.     Breath sounds: Normal breath sounds. No wheezing or rales.  Abdominal:     General: There is no distension.     Palpations: Abdomen is soft.     Tenderness: There is no abdominal tenderness. There is no guarding.  Musculoskeletal:     Cervical back: Normal range of motion.  Skin:    General: Skin is warm and dry.  Neurological:     Mental Status: He is alert and oriented to person, place, and time.     GCS: GCS eye subscore is 4. GCS verbal subscore is 5. GCS motor subscore is 6.     Cranial Nerves: Cranial nerves are intact. Dysarthria: waxing and waning, worse with continued speech, reports this is not new.     Motor: Motor function is intact. No weakness or pronator drift (has pain moving both shoulders).     Coordination: Coordination is intact. Finger-Nose-Finger Test (has pain moving both shoulders limiting exam) normal.    ED Results / Procedures / Treatments   Labs (all labs ordered are listed, but only abnormal results are displayed) Labs Reviewed  CBC WITH DIFFERENTIAL/PLATELET - Abnormal; Notable for the following components:      Result Value   WBC 10.8 (*)    All other components within normal limits  COMPREHENSIVE METABOLIC PANEL - Abnormal; Notable for the following components:   Potassium 5.2 (*)    Glucose, Bld 110 (*)    BUN 35 (*)    GFR, Estimated 56 (*)    All other  components within normal limits  TROPONIN I (HIGH SENSITIVITY) - Abnormal; Notable for the following components:   Troponin I (High Sensitivity) 32 (*)    All other components within normal limits  TROPONIN I (HIGH SENSITIVITY) - Abnormal; Notable for the following components:   Troponin I (High Sensitivity) 27 (*)    All other components within normal limits  MAGNESIUM    EKG EKG Interpretation  Date/Time:  Thursday December 02 2020 09:48:56 EDT Ventricular Rate:  71 PR Interval:  438 QRS  Duration: 114 QT Interval:  488 QTC Calculation: 531 R Axis:   -12 Text Interpretation: Sinus rhythm Prolonged PR interval Borderline intraventricular conduction delay Prolonged QT interval Confirmed by Gareth Morgan [phone]) on 12/02/2020 1:37:23 PM  Radiology CT HEAD WO CONTRAST (5MM)  Result Date: 12/02/2020 CLINICAL DATA:  Head trauma.  Abnormal mental status EXAM: CT HEAD WITHOUT CONTRAST TECHNIQUE: Contiguous axial images were obtained from the base of the skull through the vertex without intravenous contrast. COMPARISON:  03/21/2015 FINDINGS: Brain: Generalized atrophy with interval progression. Mild to moderate white matter hypodensity bilaterally with interval progression. Negative for acute infarct, hemorrhage, mass Vascular: Negative for hyperdense vessel Skull: Negative Sinuses/Orbits: Paranasal sinuses clear. Bilateral cataract extraction Other: None IMPRESSION: No acute abnormality Progression of atrophy and chronic microvascular ischemia since 2017. Electronically Signed   By: Franchot Gallo M.D.   On: 12/02/2020 10:57   DG Chest Portable 1 View  Result Date: 12/02/2020 CLINICAL DATA:  Bradycardia altered mental status EXAM: PORTABLE CHEST 1 VIEW COMPARISON:  05/02/2005 FINDINGS: Hypoventilation with mild atelectasis in the bases. Negative for heart failure or edema. No pleural effusion. Calcified granuloma right lung base. IMPRESSION: Mild bibasilar atelectasis with hypoventilation.  Electronically Signed   By: Franchot Gallo M.D.   On: 12/02/2020 10:55    Procedures Procedures   Medications Ordered in ED Medications  sodium zirconium cyclosilicate (LOKELMA) packet 10 g (10 g Oral Given 12/02/20 1332)    ED Course  I have reviewed the triage vital signs and the nursing notes.  Pertinent labs & imaging results that were available during my care of the patient were reviewed by me and considered in my medical decision making (see chart for details).    MDM Rules/Calculators/A&P                           87 year old male with history of diabetes, hypertension, history of high degree AV block who had previously seen Dr. Lovena Le of cardiology, who presents with concern for fall, and question of altered mental status.    CT head completed shows no evidence of acute intracranial hemorrhage.  He has no focal findings on my exam to suggest an acute stroke.  Is noted to have waxing and waning speech abnormality, which he reports is chronic over the last year, and discussed with patient and son that its possible that he had a more remote stroke leading to this one year ago, but do not see signs of acute CVA on exam or history today.  He denies chest pain, dyspnea, infectious symptoms.  Labs obtained show mild kidney disease with mild hyperkalemia, given lokelma. Troponin mildly elevated and on repeat same, doub ACS.   CXR without acute findings.  He has remained stable while in the emergency department, with normal mental status, normal blood pressures and oxygenation.  He is able to answer the questions that he was not answering earlier, and is not clear if he was joking with EMS with the answers that he gave.  Is noted to have bradycardia, and consulted cardiology for evaluation given unclear EKG concern for possible heart block.  Cardiology evaluated EKG and felt that he had a first-degree heart block with some PACs.  Given is not clear if this was a syncopal episode that led to  this event, they are recommending a Zio patch, and this was applied prior to discharge.  He will follow-up closely with cardiology as an outpatient and return for any new  or worsening concerns.  He is asymptomatic while in the emergency department. Patient discharged in stable condition with understanding of reasons to return.     Final Clinical Impression(s) / ED Diagnoses Final diagnoses:  Bradycardia  Fall, initial encounter    Rx / DC Orders ED Discharge Orders          Ordered    LONG TERM MONITOR (3-14 DAYS)        12/02/20 1217             Gareth Morgan, MD 12/02/20 1341

## 2020-12-02 NOTE — ED Notes (Signed)
Pt sleeping at this time. HR in the 30's read afib on the monitor. New EKG recorded reads Mobitz II heart block. PA aware. Pacer pads ad bedside.

## 2020-12-02 NOTE — Consult Note (Addendum)
Cardiology Consultation:   Patient ID: LADD CEN MRN: [id number]; DOB: 1931-12-15  Admit date: 12/02/2020 Date of Consult: 12/02/2020  PCP:  Kathyrn Lass, MD   [facility] Providers Cardiologist:  None  Electrophysiologist:  Cristopher Peru, MD    Patient Profile:   William Mullins is a 87 y.o. male with a hx of high degree AVB, DM, HTN who is being seen 12/02/2020 for the evaluation of bradycardia\ at the request of Dr. Cephus Fischer.  History of Present Illness:   William Mullins is a 43 male with PMH noted above.  He is a frail older male who resides in SNF at [facility].  He is wheelchair-bound. He was initially referred to the Afib clinic back in 2020 after having a EKG done at PCP office that was concerning for Afib.  He was started on Eliquis by his PCP.  When seen in the A. fib clinic strips were reviewed along with EKG that day with no atrial fibrillation noted.  He was not noted to be on any AV nodal blocking agents.  EKG did note AV block.  His Eliquis was stopped and he was referred to Dr. Lovena Le to discuss potential PPM.  Seen in the office with Dr. Lovena Le on 02/2018.  He was noted to have high degree AV block with alternating 3-2 and 2-1 AV block.  He had not had any syncope, dizziness or symptomatic bradycardia.  It was discussed that they would continue watchful waiting.  He has not had follow-up since that time.  He presented to the ED on 10/13 with a questionable fall from bed or wheelchair.  Patient is a poor historian therefore information is obtained from chart and ED physician.  Apparently patient had a fall about 5 weeks prior from a elevated recliner, but was not evaluated in the ED episode.  Today it was noted that he would prompt a chair and landed on his bottom.  He has no obvious injury noted.  EMS was called and was concerned regarding atrial fibrillation.  Labs in the ED showed sodium 138 potassium 5.2 creatinine 1.2, WBC 10.8 hemoglobin 13.  CT head showed no acute  abnormality.  Series of EKGs are as follows:  0839--> reads as accelerated junctional rhythm, 76 bpm though appears could have a prolonged PR interval, would favor the latter 0919--> reads as 2nd degree AVB type II, 43 bpm, but looks more sinus, 1st degree AVB with PACs, blocked p waves 0948--> 1st degree AVB, 71 bpm  Telemetry he clearly has a first-degree AV block, very long PR interval.  Heart rate drops into the 30-40 range while he is sleeping, but promptly returns with rates in the 50-70 range following.  There is no clear evidence of high degree AV block noted right now.  He has no complaints at the time of interview.   Past Medical History:  Diagnosis Date   Arthritis    Diabetes mellitus without complication ([place])    Hypertension     Past Surgical History:  Procedure Laterality Date   JOINT REPLACEMENT Bilateral    SHOULDER SURGERY       Home Medications:  Prior to Admission medications   Medication Sig Start Date End Date Taking? Authorizing Provider  benazepril (LOTENSIN) 10 MG tablet Take 10 mg by mouth daily.   Yes [provider]  Melatonin 10 MG TABS Take 10 mg by mouth at bedtime.   Yes [provider]  metFORMIN (GLUCOPHAGE) 500 MG tablet Take 500 mg by  mouth daily.   Yes [provider]  Multiple Vitamin (MULTIVITAMIN WITH MINERALS) TABS tablet Take 1 tablet by mouth daily.   Yes [provider]  nystatin cream (MYCOSTATIN) Apply 1 application topically 2 (two) times daily. To groin area   Yes [provider]  oxyCODONE-acetaminophen (PERCOCET/ROXICET) 5-325 MG tablet Take by mouth every 8 (eight) hours as needed for severe pain.   Yes [provider]  allopurinol (ZYLOPRIM) 100 MG tablet Take 100 mg by mouth daily. 03/17/15   [provider]  amLODipine (NORVASC) 5 MG tablet Take 5 mg by mouth daily.    [provider]  aspirin EC 81 MG tablet Take 81 mg by mouth daily.    [provider]  cholecalciferol (VITAMIN D) 1000 units tablet Take 1,000 Units by mouth daily.    [provider]  meloxicam (MOBIC) 7.5 MG tablet Take 7.5 mg by mouth daily.    [provider]  vitamin C (ASCORBIC ACID) 500 MG tablet Take 500 mg by mouth daily.    [provider]  vitamin E 400 UNIT capsule Take 400 Units by mouth daily.    [provider]    Inpatient Medications: Scheduled Meds:  Continuous Infusions:  PRN Meds:   Allergies:   No Known Allergies  Social History:   Social History   Socioeconomic History   Marital status: Single    Spouse name: Not on file   Number of children: Not on file   Years of education: Not on file   Highest education level: Not on file  Occupational History   Not on file  Tobacco Use   Smoking status: Never   Smokeless tobacco: Never  Vaping Use   Vaping Use: Never used  Substance and Sexual Activity   Alcohol use: No   Drug use: No   Sexual activity: Not on file  Other Topics Concern   Not on file  Social History Narrative   Not on file   Social Determinants of Health   Financial Resource Strain: Not on file  Food Insecurity: Not on file  Transportation Needs: Not on file  Physical Activity: Not on file  Stress: Not on file  Social Connections: Not on file  Intimate Partner Violence: Not on file    Family History:    Family History  Problem Relation Age of Onset   Hypertension Father      ROS:  Please see the history of present illness.   All other ROS reviewed and negative.     Physical Exam/Data:   Vitals:   12/02/20 1030 12/02/20 1123 12/02/20 1125 12/02/20 1200  BP: 122/65  123/68 117/75  Pulse: 75 74 72 72  Resp: 15 (!) 27 18 13   Temp:      TempSrc:      SpO2: 95% 96% 95% 94%  Weight:      Height:       No intake or output data in the 24 hours ending 12/02/20 1218 Last 3 Weights 12/02/2020 02/27/2018 02/25/2018  Weight (lbs) 229 lb 0.9 oz 229 lb 229 lb  Weight (kg)  103.9 kg 103.874 kg 103.874 kg     Body mass index is 39.32 kg/m.  General:  Pleasant older male, sitting up in bed HEENT: normal Neck: no JVD Vascular: No carotid bruits Cardiac:  normal S1, S2; RRR; no murmur  Lungs:  clear to auscultation bilaterally, no wheezing, rhonchi or rales  Abd: soft, nontender, no hepatomegaly  Ext:  no edema Musculoskeletal:  No deformities, BUE and BLE strength normal and equal Skin: warm and dry  Neuro:  CNs 2-12 intact, no focal abnormalities noted Psych:  Normal affect   EKG:  The EKG was personally reviewed and demonstrates:   0839--> reads as accelerated junctional rhythm, 76 bpm though appears could have a prolonged PR interval, would favor the latter 0919--> reads as 2nd degree AVB type II, 43 bpm, but looks more sinus, 1st degree AVB with PACs, blocked p waves 0948--> 1st degree AVB, 71 bpm  Relevant CV Studies:  N/a   Laboratory Data:  High Sensitivity Troponin:   Recent Labs  Lab 12/02/20 0950  TROPONINIHS 32*     Chemistry Recent Labs  Lab 12/02/20 0950  NA 138  K 5.2*  CL 107  CO2 24  GLUCOSE 110*  BUN 35*  CREATININE 1.24  CALCIUM 9.1  MG 1.7  GFRNONAA 56*  ANIONGAP 7    Recent Labs  Lab 12/02/20 0950  PROT 7.8  ALBUMIN 3.5  AST 23  ALT 18  ALKPHOS 99  BILITOT 0.4   Lipids No results for input(s): CHOL, TRIG, HDL, LABVLDL, LDLCALC, CHOLHDL in the last 168 hours.  Hematology Recent Labs  Lab 12/02/20 0950  WBC 10.8*  RBC 4.35  HGB 13.0  HCT 40.6  MCV 93.3  MCH 29.9  MCHC 32.0  RDW 15.2  PLT 243   Thyroid No results for input(s): TSH, FREET4 in the last 168 hours.  BNPNo results for input(s): BNP, PROBNP in the last 168 hours.  DDimer No results for input(s): DDIMER in the last 168 hours.   Radiology/Studies:  CT HEAD WO CONTRAST (5MM)  Result Date: 12/02/2020 CLINICAL DATA:  Head trauma.  Abnormal mental status EXAM: CT HEAD WITHOUT CONTRAST TECHNIQUE: Contiguous axial images were obtained  from the base of the skull through the vertex without intravenous contrast. COMPARISON:  03/21/2015 FINDINGS: Brain: Generalized atrophy with interval progression. Mild to moderate white matter hypodensity bilaterally with interval progression. Negative for acute infarct, hemorrhage, mass Vascular: Negative for hyperdense vessel Skull: Negative Sinuses/Orbits: Paranasal sinuses clear. Bilateral cataract extraction Other: None IMPRESSION: No acute abnormality Progression of atrophy and chronic microvascular ischemia since 2017. Electronically Signed   By: Franchot Gallo M.D.   On: 12/02/2020 10:57   DG Chest Portable 1 View  Result Date: 12/02/2020 CLINICAL DATA:  Bradycardia altered mental status EXAM: PORTABLE CHEST 1 VIEW COMPARISON:  05/02/2005 FINDINGS: Hypoventilation with mild atelectasis in the bases. Negative for heart failure or edema. No pleural effusion. Calcified granuloma right lung base. IMPRESSION: Mild bibasilar atelectasis with hypoventilation. Electronically Signed   By: Franchot Gallo M.D.   On: 12/02/2020 10:55     Assessment and Plan:   MERVYN PFLAUM is a 87 y.o. male with a hx of high degree AVB, DM, HTN who is being seen 12/02/2020 for the evaluation of bradycardia at the request of Dr. Egidio Fischer.  Bradycardia with questionable syncope: It is unclear if syncope as episode was unwitnessed.  Patient is only able to remember waking up on the floor. He is a poor historian, wheel chair bound. Review of EKG shows first-degree AV block prolonged PR interval, PACs versus blocked P waves.  Reviewed telemetry with no notable high degree AV block presently.  He is not on any AV nodal blocking agents.  He has been seen by Dr. Lovena Le back in 2020. Reviewed with MD.  -- We will plan to place my ZIO monitor prior to  discharge ( have called [facility] to place) -- Arrange follow-up in the office with EP  For questions or updates, please contact [place] Please consult  [email or web address] for contact info under    Signed, Reino Bellis, NP  12/02/2020 12:18 PM   I have seen and examined the patient along with Reino Bellis, NP.  I have reviewed the chart, notes and new data.  I agree with PA/NP's note.  Key new complaints: It is quite unclear whether he had a simple mechanical slip/fall from the recliner or actually lost consciousness.  He has not had any random episodes of dizziness/lightheadedness, presyncope/syncope at other times. Key examination changes: Elderly, frail, clear lungs, no JVD.  Normal Baseline rhythm with occasional dropped beats.  No significant murmurs.  No edema.  Intact distal pulses.  Severe osteoarthritic changes of both hands.  No gross neurological abnormalities.  He does not have dysarthria or aphasia during my interaction with him. Key new findings / data: The ECG shows extremely long AV conduction time, but the QRS is always narrow.  Telemetry shows episodes of Mobitz type I AV block.  The Wenckebach cycles are highly variable, some as short as 3: 2, but others as long as 9: 8, none with any meaningful bradycardia.  PLAN: Mr. Fooks has clear evidence of AV node conduction abnormalities with Wenckebach cycles, but no meaningful bradycardia has been documented.  It also remains quite unclear whether his fall was due to true syncope or was a mechanical issue.  He is otherwise asymptomatic.  We will have him wear a 14-day event monitor and follow-up with electrophysiology to decide whether or not a pacemaker is indicated at this time (in my opinion no clear proof that this is needed yet).  Avoid all medications with negative chronotropic effect.  Sanda Klein, MD, [place] [phone] 12/02/2020, 12:22 PM

## 2020-12-02 NOTE — ED Notes (Signed)
Registration reports that patient claiming his words are getting ties. Upon entering the room pt speech appears to be slurred. Speech was not slurred upon arrival. Dr. Willi Fischer aware and at bedside. Dr. Thornton Fischer would not like to activate code stroke at this time. Pt states that his slurred speech has been coming and going for a while. Dr. Eufemio Fischer also spoke with patient son who states that the pt has;t been "himself" since a fall approximately 5 weeks ago. Son reports that he his head during the fall but did not go to ER to be evaluated. CT called to expedite CT scan.

## 2020-12-02 NOTE — ED Triage Notes (Addendum)
Pt arrives via EMS from [facility] [address] for c/o falling out of his wheelchair. Wife witness the fall and states that he slipped out and landed on his bottom. Per EMS Pt was noted to be in afib on heart monitor per ems with no prior history. They also state he was not answering questions appropriately. Pt is alert and oriented to self, place and time.

## 2020-12-02 NOTE — ED Notes (Signed)
EKG tech placing cardiac monitor on patient at this time

## 2020-12-03 DIAGNOSIS — R001 Bradycardia, unspecified: Secondary | ICD-10-CM | POA: Diagnosis not present

## 2020-12-06 DIAGNOSIS — I10 Essential (primary) hypertension: Secondary | ICD-10-CM | POA: Diagnosis not present

## 2020-12-06 DIAGNOSIS — E119 Type 2 diabetes mellitus without complications: Secondary | ICD-10-CM | POA: Diagnosis not present

## 2020-12-06 DIAGNOSIS — E782 Mixed hyperlipidemia: Secondary | ICD-10-CM | POA: Diagnosis not present

## 2020-12-06 DIAGNOSIS — I4891 Unspecified atrial fibrillation: Secondary | ICD-10-CM | POA: Diagnosis not present

## 2020-12-06 DIAGNOSIS — W19XXXA Unspecified fall, initial encounter: Secondary | ICD-10-CM | POA: Diagnosis not present

## 2020-12-06 DIAGNOSIS — M1A9XX0 Chronic gout, unspecified, without tophus (tophi): Secondary | ICD-10-CM | POA: Diagnosis not present

## 2020-12-07 DIAGNOSIS — M1991 Primary osteoarthritis, unspecified site: Secondary | ICD-10-CM | POA: Diagnosis not present

## 2020-12-07 DIAGNOSIS — E1169 Type 2 diabetes mellitus with other specified complication: Secondary | ICD-10-CM | POA: Diagnosis not present

## 2020-12-07 DIAGNOSIS — E7800 Pure hypercholesterolemia, unspecified: Secondary | ICD-10-CM | POA: Diagnosis not present

## 2020-12-07 DIAGNOSIS — E785 Hyperlipidemia, unspecified: Secondary | ICD-10-CM | POA: Diagnosis not present

## 2020-12-07 DIAGNOSIS — E1140 Type 2 diabetes mellitus with diabetic neuropathy, unspecified: Secondary | ICD-10-CM | POA: Diagnosis not present

## 2020-12-07 DIAGNOSIS — D649 Anemia, unspecified: Secondary | ICD-10-CM | POA: Diagnosis not present

## 2020-12-07 DIAGNOSIS — I10 Essential (primary) hypertension: Secondary | ICD-10-CM | POA: Diagnosis not present

## 2020-12-08 DIAGNOSIS — M1000 Idiopathic gout, unspecified site: Secondary | ICD-10-CM | POA: Diagnosis not present

## 2020-12-08 DIAGNOSIS — M545 Low back pain, unspecified: Secondary | ICD-10-CM | POA: Diagnosis not present

## 2020-12-08 DIAGNOSIS — M150 Primary generalized (osteo)arthritis: Secondary | ICD-10-CM | POA: Diagnosis not present

## 2020-12-08 DIAGNOSIS — E119 Type 2 diabetes mellitus without complications: Secondary | ICD-10-CM | POA: Diagnosis not present

## 2020-12-08 DIAGNOSIS — I10 Essential (primary) hypertension: Secondary | ICD-10-CM | POA: Diagnosis not present

## 2020-12-20 ENCOUNTER — Telehealth: Payer: Self-pay | Admitting: *Deleted

## 2020-12-20 NOTE — Progress Notes (Deleted)
Cardiology Office Note Date:  12/20/2020  Patient ID:  William, Mullins 1931/10/24, MRN [id number] PCP:  Kathyrn Lass, MD  Electrophysiologist: Dr. Lovena Le  ***refresh   Chief Complaint: *** post hospital  History of Present Illness: William Mullins is a 87 y.o. male with history of unsteady gait, wheelchair bound, arthritis, HTN, DM  Reside at [facility]  Jan 2020 referred by his PMD to the AFib clinic for suspect Afib, started on eliquis.  At his AF clinic consult, not felt to be true Afib, though did have heart block and referred to EP, Eliquis stopped   He comes in today to be seen for Dr. Lovena Le, last seen by him Jan 2020, referred via the AFib clinic for bradycardia and 3:1 and 2:1 heart block, no clear symptoms associated with his bradycardia and planned to monitor/watchful waiting. This was his 1st and only visit.  He had an ER visit 12/02/20 for a question of a fall  Device information ***   Past Medical History:  Diagnosis Date   Arthritis    Diabetes mellitus without complication ([place])    Hypertension     Past Surgical History:  Procedure Laterality Date   JOINT REPLACEMENT Bilateral    SHOULDER SURGERY      Current Outpatient Medications  Medication Sig Dispense Refill   allopurinol (ZYLOPRIM) 100 MG tablet Take 100 mg by mouth daily.  2   amLODipine (NORVASC) 5 MG tablet Take 5 mg by mouth daily.     Ascorbic Acid (VITAMIN C) 1000 MG tablet Take 1,000 mg by mouth daily.     aspirin EC 81 MG tablet Take 81 mg by mouth daily.     atorvastatin (LIPITOR) 10 MG tablet Take 10 mg by mouth at bedtime.     benazepril (LOTENSIN) 10 MG tablet Take 10 mg by mouth daily.     Ergocalciferol (VITAMIN D2) 50 MCG (2000 UT) TABS Take 2,000 Units by mouth daily.     Melatonin 10 MG TABS Take 10 mg by mouth at bedtime.     metFORMIN (GLUCOPHAGE) 500 MG tablet Take 500 mg by mouth daily.     Multiple Vitamin (MULTIVITAMIN WITH MINERALS) TABS tablet Take 1 tablet by mouth  daily.     nystatin cream (MYCOSTATIN) Apply 1 application topically 2 (two) times daily. To groin area     oxyCODONE-acetaminophen (PERCOCET/ROXICET) 5-325 MG tablet Take by mouth every 8 (eight) hours as needed for severe pain.     vitamin E 400 UNIT capsule Take 400 Units by mouth daily.     No current facility-administered medications for this visit.    Allergies:   Patient has no known allergies.   Social History:  The patient  reports that he has never smoked. He has never used smokeless tobacco. He reports that he does not drink alcohol and does not use drugs.   Family History:  The patient's family history includes Hypertension in his father.***  ROS:  Please see the history of present illness.    All other systems are reviewed and otherwise negative.   PHYSICAL EXAM:  VS:  There were no vitals taken for this visit. BMI: There is no height or weight on file to calculate BMI. Well nourished, well developed, in no acute distress HEENT: normocephalic, atraumatic Neck: no JVD, carotid bruits or masses Cardiac:  *** RRR; no significant murmurs, no rubs, or gallops Lungs:  *** CTA b/l, no wheezing, rhonchi or rales Abd: soft, nontender MS: no deformity  or *** atrophy Ext: *** no edema Skin: warm and dry, no rash Neuro:  No gross deficits appreciated Psych: euthymic mood, full affect  *** PPM/ICD site is stable, no tethering or discomfort   EKG:  Done today and reviewed by myself shows  ***  Device interrogation done today and reviewed by myself:  ***  Recent Labs: 12/02/2020: ALT 18; BUN 35; Creatinine, Ser 1.24; Hemoglobin 13.0; Magnesium 1.7; Platelets 243; Potassium 5.2; Sodium 138  No results found for requested labs within last 8760 hours.   CrCl cannot be calculated (Unknown ideal weight.).   Wt Readings from Last 3 Encounters:  12/02/20 229 lb 0.9 oz (103.9 kg)  02/27/18 229 lb (103.9 kg)  02/25/18 229 lb (103.9 kg)     Other studies reviewed: Additional  studies/records reviewed today include: summarized above  ASSESSMENT AND PLAN:  ***  Disposition: F/u with ***  Current medicines are reviewed at length with the patient today.  The patient did not have any concerns regarding medicines.  Venetia Night, PA-C 12/20/2020 1:19 PM     [address] [place] [address] [phone] (office)  [phone] (fax)

## 2020-12-20 NOTE — Telephone Encounter (Signed)
An error was made when order was placed from the hospital.  Patient had received a second 14 day ZIO XT Monitor by mail.  He had just completed the 14 day monitor that was applied by the hospital.   Contacted patient to inform him he could send the second monitor back to [facility] in the envelope with [employer].   Patient informed there would be no charge for the second monitor.  Patient stated his son had already returned the monitor.

## 2020-12-23 ENCOUNTER — Ambulatory Visit: Payer: Medicare Other | Admitting: Physician Assistant

## 2020-12-27 ENCOUNTER — Telehealth: Payer: Self-pay | Admitting: Internal Medicine

## 2020-12-27 DIAGNOSIS — E119 Type 2 diabetes mellitus without complications: Secondary | ICD-10-CM | POA: Diagnosis not present

## 2020-12-27 DIAGNOSIS — Z8616 Personal history of COVID-19: Secondary | ICD-10-CM | POA: Diagnosis not present

## 2020-12-27 DIAGNOSIS — E782 Mixed hyperlipidemia: Secondary | ICD-10-CM | POA: Diagnosis not present

## 2020-12-27 DIAGNOSIS — I10 Essential (primary) hypertension: Secondary | ICD-10-CM | POA: Diagnosis not present

## 2020-12-27 NOTE — Addendum Note (Signed)
Encounter addended by: Markus Daft A on: 12/27/2020 1:17 PM  Actions taken: Imaging Exam ended

## 2020-12-27 NOTE — Telephone Encounter (Signed)
William Mullins from [facility] called in with some additional  info.  Transfer to triage   Best number [phone]

## 2020-12-27 NOTE — Telephone Encounter (Signed)
Received phone call from Princella Ion who states pt had 2.4 second pause due to high grade AV block on  12/11/2020.  Danielle states alert was not called as pt reported he was not symptomatic and pause did not meet criteria for reporting of being at least a 4 second pause.  This will be available for review in final report.  Dr Sallyanne Kuster is to read monitor report. Pt is scheduled to see Tommye Standard on 01/03/2021 for follow up.  Will forward notification to Dr Sallyanne Kuster.

## 2021-01-01 NOTE — Progress Notes (Deleted)
Cardiology Office Note Date:  01/01/2021  Patient ID:  William Mullins, William Mullins 07/01/1931, MRN [id number] PCP:  Kathyrn Lass, MD  Electrophysiologist: Dr. Lovena William Mullins  ***refresh   Chief Complaint: *** post hospital  History of Present Illness: William Mullins is a 87 y.o. male with history of unsteady gait, wheelchair bound, arthritis, HTN, DM  Reside at [facility]  Jan 2020 referred by his PMD to the AFib clinic for suspect Afib, started on eliquis.  At his AF clinic consult, not felt to be true Afib, though did have heart block and referred to EP, Eliquis stopped   He comes in today to be seen for Dr. Lovena William Mullins, last seen by him Jan 2020, referred via the AFib clinic for bradycardia and 3:1 and 2:1 heart block, no clear symptoms associated with his bradycardia and planned to monitor/watchful waiting. This was his 1st and only visit.  He had an ER visit 12/02/20 for a question of a fall  He wore a monitor that noted  The dominant rhythm is normal sinus with first degree AV block. There are 2 episodes of high grade AV block (consecutive non-conducted P waves), the longest lasting 3.4" There are several episodes of third degree AV block with junctional escape rhythm (narrow QRS complex) at 21-25 bpm. There is no atrial fibrillation and no evidence of complex ventricular tachycardia. Abnormal event monitor with pauses due to high grade AV block and with brief periods of complete heart block with narrow-QRS complex junctional escape rhythm.   In my review the brady episodes occurred at time of day that he would be expected to have been awake.   *** fall, syncope, brady symptoms *** meds, nodal blocker *** functional status    Past Medical History:  Diagnosis Date   Arthritis    Diabetes mellitus without complication ([place])    Hypertension     Past Surgical History:  Procedure Laterality Date   JOINT REPLACEMENT Bilateral    SHOULDER SURGERY      Current Outpatient Medications   Medication Sig Dispense Refill   allopurinol (ZYLOPRIM) 100 MG tablet Take 100 mg by mouth daily.  2   amLODipine (NORVASC) 5 MG tablet Take 5 mg by mouth daily.     Ascorbic Acid (VITAMIN C) 1000 MG tablet Take 1,000 mg by mouth daily.     aspirin EC 81 MG tablet Take 81 mg by mouth daily.     atorvastatin (LIPITOR) 10 MG tablet Take 10 mg by mouth at bedtime.     benazepril (LOTENSIN) 10 MG tablet Take 10 mg by mouth daily.     Ergocalciferol (VITAMIN D2) 50 MCG (2000 UT) TABS Take 2,000 Units by mouth daily.     Melatonin 10 MG TABS Take 10 mg by mouth at bedtime.     metFORMIN (GLUCOPHAGE) 500 MG tablet Take 500 mg by mouth daily.     Multiple Vitamin (MULTIVITAMIN WITH MINERALS) TABS tablet Take 1 tablet by mouth daily.     nystatin cream (MYCOSTATIN) Apply 1 application topically 2 (two) times daily. To groin area     oxyCODONE-acetaminophen (PERCOCET/ROXICET) 5-325 MG tablet Take by mouth every 8 (eight) hours as needed for severe pain.     vitamin E 400 UNIT capsule Take 400 Units by mouth daily.     No current facility-administered medications for this visit.    Allergies:   Patient has no known allergies.   Social History:  The patient  reports that he has never smoked. He  has never used smokeless tobacco. He reports that he does not drink alcohol and does not use drugs.   Family History:  The patient's family history includes Hypertension in his father.***  ROS:  Please see the history of present illness.    All other systems are reviewed and otherwise negative.   PHYSICAL EXAM:  VS:  There were no vitals taken for this visit. BMI: There is no height or weight on file to calculate BMI. Well nourished, well developed, in no acute distress HEENT: normocephalic, atraumatic Neck: no JVD, carotid bruits or masses Cardiac:  *** RRR; no significant murmurs, no rubs, or gallops Lungs:  *** CTA b/l, no wheezing, rhonchi or rales Abd: soft, nontender MS: no deformity or ***  atrophy Ext: *** no edema Skin: warm and dry, no rash Neuro:  No gross deficits appreciated Psych: euthymic mood, full affect  *** PPM/ICD site is stable, no tethering or discomfort   EKG:  Done today and reviewed by myself shows  ***  Device interrogation done today and reviewed by myself:  ***  Recent Labs: 12/02/2020: ALT 18; BUN 35; Creatinine, Ser 1.24; Hemoglobin 13.0; Magnesium 1.7; Platelets 243; Potassium 5.2; Sodium 138  No results found for requested labs within last 8760 hours.   CrCl cannot be calculated (Patient's most recent lab result is older than the maximum 21 days allowed.).   Wt Readings from Last 3 Encounters:  12/02/20 229 lb 0.9 oz (103.9 kg)  02/27/18 229 lb (103.9 kg)  02/25/18 229 lb (103.9 kg)     Other studies reviewed: Additional studies/records reviewed today include: summarized above  ASSESSMENT AND PLAN:  ***  Disposition: F/u with ***  Current medicines are reviewed at length with the patient today.  The patient did not have any concerns regarding medicines.  Venetia Night, PA-C 01/01/2021 8:07 AM     [place] [address] [phone] (office)  [phone] (fax)

## 2021-01-03 ENCOUNTER — Ambulatory Visit: Payer: Medicare Other | Admitting: Physician Assistant

## 2021-01-03 DIAGNOSIS — M1A9XX0 Chronic gout, unspecified, without tophus (tophi): Secondary | ICD-10-CM | POA: Diagnosis not present

## 2021-01-03 DIAGNOSIS — I10 Essential (primary) hypertension: Secondary | ICD-10-CM | POA: Diagnosis not present

## 2021-01-03 DIAGNOSIS — E782 Mixed hyperlipidemia: Secondary | ICD-10-CM | POA: Diagnosis not present

## 2021-01-03 DIAGNOSIS — B372 Candidiasis of skin and nail: Secondary | ICD-10-CM | POA: Diagnosis not present

## 2021-01-03 DIAGNOSIS — Z8616 Personal history of COVID-19: Secondary | ICD-10-CM | POA: Diagnosis not present

## 2021-01-03 DIAGNOSIS — E119 Type 2 diabetes mellitus without complications: Secondary | ICD-10-CM | POA: Diagnosis not present

## 2021-01-05 DIAGNOSIS — E119 Type 2 diabetes mellitus without complications: Secondary | ICD-10-CM | POA: Diagnosis not present

## 2021-01-05 DIAGNOSIS — M150 Primary generalized (osteo)arthritis: Secondary | ICD-10-CM | POA: Diagnosis not present

## 2021-01-05 DIAGNOSIS — B372 Candidiasis of skin and nail: Secondary | ICD-10-CM | POA: Diagnosis not present

## 2021-01-05 DIAGNOSIS — M1A9XX0 Chronic gout, unspecified, without tophus (tophi): Secondary | ICD-10-CM | POA: Diagnosis not present

## 2021-01-05 DIAGNOSIS — M545 Low back pain, unspecified: Secondary | ICD-10-CM | POA: Diagnosis not present

## 2021-01-05 DIAGNOSIS — I10 Essential (primary) hypertension: Secondary | ICD-10-CM | POA: Diagnosis not present

## 2021-01-06 DIAGNOSIS — E119 Type 2 diabetes mellitus without complications: Secondary | ICD-10-CM | POA: Diagnosis not present

## 2021-01-06 DIAGNOSIS — Z79899 Other long term (current) drug therapy: Secondary | ICD-10-CM | POA: Diagnosis not present

## 2021-01-06 DIAGNOSIS — I10 Essential (primary) hypertension: Secondary | ICD-10-CM | POA: Diagnosis not present

## 2021-01-07 DIAGNOSIS — D649 Anemia, unspecified: Secondary | ICD-10-CM | POA: Diagnosis not present

## 2021-01-10 ENCOUNTER — Encounter: Payer: Self-pay | Admitting: Student

## 2021-01-10 ENCOUNTER — Ambulatory Visit (INDEPENDENT_AMBULATORY_CARE_PROVIDER_SITE_OTHER): Payer: Medicare Other | Admitting: Student

## 2021-01-10 ENCOUNTER — Other Ambulatory Visit: Payer: Self-pay

## 2021-01-10 VITALS — BP 122/68 | HR 91 | Ht 64.0 in

## 2021-01-10 DIAGNOSIS — M545 Low back pain, unspecified: Secondary | ICD-10-CM | POA: Diagnosis not present

## 2021-01-10 DIAGNOSIS — M1990 Unspecified osteoarthritis, unspecified site: Secondary | ICD-10-CM

## 2021-01-10 DIAGNOSIS — M1A9XX0 Chronic gout, unspecified, without tophus (tophi): Secondary | ICD-10-CM | POA: Diagnosis not present

## 2021-01-10 DIAGNOSIS — E119 Type 2 diabetes mellitus without complications: Secondary | ICD-10-CM | POA: Diagnosis not present

## 2021-01-10 DIAGNOSIS — R001 Bradycardia, unspecified: Secondary | ICD-10-CM | POA: Diagnosis not present

## 2021-01-10 DIAGNOSIS — M150 Primary generalized (osteo)arthritis: Secondary | ICD-10-CM | POA: Diagnosis not present

## 2021-01-10 DIAGNOSIS — I10 Essential (primary) hypertension: Secondary | ICD-10-CM | POA: Diagnosis not present

## 2021-01-10 DIAGNOSIS — B372 Candidiasis of skin and nail: Secondary | ICD-10-CM | POA: Diagnosis not present

## 2021-01-10 NOTE — Patient Instructions (Signed)
Medication Instructions:  Your physician recommends that you continue on your current medications as directed. Please refer to the Current Medication list given to you today.  *If you need a refill on your cardiac medications before your next appointment, please call your pharmacy*   Lab Work: None  If you have labs (blood work) drawn today and your tests are completely normal, you will receive your results only by: [place] (if you have MyChart) OR A paper copy in the mail If you have any lab test that is abnormal or we need to change your treatment, we will call you to review the results.   Follow-Up: At [facility], you and your health needs are our priority.  As part of our continuing mission to provide you with exceptional heart care, we have created designated Provider Care Teams.  These Care Teams include your primary Cardiologist (physician) and Advanced Practice Providers (APPs -  Physician Assistants and Nurse Practitioners) who all work together to provide you with the care you need, when you need it.  We recommend signing up for the patient portal called "MyChart".  Sign up information is provided on this After Visit Summary.  MyChart is used to connect with patients for Virtual Visits (Telemedicine).  Patients are able to view lab/test results, encounter notes, upcoming appointments, etc.  Non-urgent messages can be sent to your provider as well.   To learn more about what you can do with MyChart, go to [email or web address].    Your next appointment:   6 month(s)  The format for your next appointment:   In Person  Provider:   You may see Cristopher Peru, MD or one of the following Advanced Practice Providers on your designated Care Team:   Tommye Standard, [place] "[facility]" [place], [place]

## 2021-01-10 NOTE — Progress Notes (Signed)
PCP:  Kathyrn Lass, MD Primary Cardiologist: None Electrophysiologist: Cristopher Peru, MD   William Mullins is a 87 y.o. male seen today for Cristopher Peru, MD for post hospital follow up.   Seen in [facility] 12/02/2020 by Dr. Sallyanne Kuster for bradycardia.   He is a frail older male who resides in SNF at [place]. He is wheelchair-bound. He was initially referred to the Afib clinic back in 2020 after having a EKG done at PCP office that was concerning for Afib.  He was started on Eliquis by his PCP.  When seen in the A. fib clinic strips were reviewed along with EKG that day with no atrial fibrillation noted.  He was not noted to be on any AV nodal blocking agents.  EKG did note AV block.  His Eliquis was stopped and he was referred to Dr. Lovena Le to discuss potential PPM.  Seen in the office with Dr. Lovena Le on 02/2018.  He was noted to have high degree AV block with alternating 3-2 and 2-1 AV block.  He had not had any syncope, dizziness or symptomatic bradycardia.  It was discussed that they would continue watchful waiting.  He has not required follow-up since that time.   He presented to the ED on 10/13 with a questionable fall from bed or wheelchair.  Patient is a poor historian therefore information is obtained from chart and ED physician.  Apparently patient had a fall about 5 weeks prior from a elevated recliner, but was not evaluated in the ED for that episode. 12/02/20 it was noted that he would fell from a chair and "landed on his bottom" without obvious injury. EMS was called and was concerned regarding atrial fibrillation.   Since discharge from hospital the patient reports doing OK. Denies lightheadedness, dizziness, or overt syncope. He is not really sure how or why he fell last month. His wife has a pacemaker. He would prefer to avoid one if possible. he denies chest pain, palpitations, dyspnea, PND, orthopnea, nausea, vomiting, dizziness, syncope, edema, weight gain, or early satiety.  Past  Medical History:  Diagnosis Date   Arthritis    Diabetes mellitus without complication ([place])    Hypertension    Past Surgical History:  Procedure Laterality Date   JOINT REPLACEMENT Bilateral    SHOULDER SURGERY      Current Outpatient Medications  Medication Sig Dispense Refill   allopurinol (ZYLOPRIM) 100 MG tablet Take 100 mg by mouth daily.  2   amLODipine (NORVASC) 5 MG tablet Take 5 mg by mouth daily.     Ascorbic Acid (VITAMIN C) 1000 MG tablet Take 1,000 mg by mouth daily.     aspirin EC 81 MG tablet Take 81 mg by mouth daily.     atorvastatin (LIPITOR) 10 MG tablet Take 10 mg by mouth at bedtime.     benazepril (LOTENSIN) 10 MG tablet Take 10 mg by mouth daily.     docusate sodium (COLACE) 100 MG capsule Take 100 mg by mouth at bedtime.     Ergocalciferol (VITAMIN D2) 50 MCG (2000 UT) TABS Take 2,000 Units by mouth daily.     Melatonin 10 MG TABS Take 10 mg by mouth at bedtime.     metFORMIN (GLUCOPHAGE) 500 MG tablet Take 500 mg by mouth daily.     Multiple Vitamin (MULTIVITAMIN WITH MINERALS) TABS tablet Take 1 tablet by mouth daily.     oxyCODONE-acetaminophen (PERCOCET/ROXICET) 5-325 MG tablet Take by mouth every 8 (eight) hours as needed for  severe pain.     vitamin E 400 UNIT capsule Take 400 Units by mouth daily.     No current facility-administered medications for this visit.    No Known Allergies  Social History   Socioeconomic History   Marital status: Single    Spouse name: Not on file   Number of children: Not on file   Years of education: Not on file   Highest education level: Not on file  Occupational History   Not on file  Tobacco Use   Smoking status: Never   Smokeless tobacco: Never  Vaping Use   Vaping Use: Never used  Substance and Sexual Activity   Alcohol use: No   Drug use: No   Sexual activity: Not on file  Other Topics Concern   Not on file  Social History Narrative   Not on file   Social Determinants of Health   Financial  Resource Strain: Not on file  Food Insecurity: Not on file  Transportation Needs: Not on file  Physical Activity: Not on file  Stress: Not on file  Social Connections: Not on file  Intimate Partner Violence: Not on file   Review of Systems: All other systems reviewed and are otherwise negative except as noted above.  Physical Exam: Vitals:   01/10/21 1149  BP: 122/68  Pulse: 91  SpO2: 98%  Height: 5\' 4"  (1.626 m)    GEN- The patient is well appearing, alert and oriented x 3 today.   HEENT: normocephalic, atraumatic; sclera clear, conjunctiva pink; hearing intact; oropharynx clear; neck supple, no JVP Lymph- no cervical lymphadenopathy Lungs- Clear to ausculation bilaterally, normal work of breathing.  No wheezes, rales, rhonchi Heart- Regular rate and rhythm, no murmurs, rubs or gallops, PMI not laterally displaced GI- soft, non-tender, non-distended, bowel sounds present, no hepatosplenomegaly Extremities- no clubbing, cyanosis, or edema; DP/PT/radial pulses 2+ bilaterally MS- no significant deformity or atrophy Skin- warm and dry, no rash or lesion Psych- euthymic mood, full affect Neuro- strength and sensation are intact  EKG is ordered. Personal review of EKG from today shows NSR in 90s with prolonged 1st degree AV block  At least one EKG from recent admission shows clear 2nd degree AV block  Additional studies reviewed include: Previous EP office notes.   Monitor 12/2020 The dominant rhythm is normal sinus with first degree AV block. There are 2 episodes of high grade AV block (consecutive non-conducted P waves), the longest lasting 3.4" There are several episodes of third degree AV block with junctional escape rhythm (narrow QRS complex) at 21-25 bpm. There is no atrial fibrillation and no evidence of complex ventricular tachycardia.   Abnormal event monitor with pauses due to high grade AV block and with brief periods of complete heart block with narrow-QRS complex  junctional escape rhythm.  Assessment and Plan:  1. Advanced Heart Block With history of 3:2 and 2:1 AV block, asymptomatic for the most part He verbalizes understanding that a pacemaker MAY provide him with some symptom relief and he would be a candidate. It is not clear if his HRs are related to his infrequent falls.  He wishes to defer pacemaker if at all possible. We will continue watchful waiting. Pt understands to call with worsening dizziness, lightheadedness, or slower HRs. He understands if he has frank syncope to report to the emergency room.   2. Arthritis Severe arthritis in hands, back, shoulders, and feet chronically. Mobility is very limited.   Follow up with Dr. Lovena Le in 6  months   Shirley Friar, PA-C  01/10/21 12:05 PM

## 2021-01-12 DIAGNOSIS — M1A9XX0 Chronic gout, unspecified, without tophus (tophi): Secondary | ICD-10-CM | POA: Diagnosis not present

## 2021-01-12 DIAGNOSIS — B372 Candidiasis of skin and nail: Secondary | ICD-10-CM | POA: Diagnosis not present

## 2021-01-12 DIAGNOSIS — I10 Essential (primary) hypertension: Secondary | ICD-10-CM | POA: Diagnosis not present

## 2021-01-12 DIAGNOSIS — E119 Type 2 diabetes mellitus without complications: Secondary | ICD-10-CM | POA: Diagnosis not present

## 2021-01-12 DIAGNOSIS — M545 Low back pain, unspecified: Secondary | ICD-10-CM | POA: Diagnosis not present

## 2021-01-12 DIAGNOSIS — M150 Primary generalized (osteo)arthritis: Secondary | ICD-10-CM | POA: Diagnosis not present

## 2021-01-17 DIAGNOSIS — M545 Low back pain, unspecified: Secondary | ICD-10-CM | POA: Diagnosis not present

## 2021-01-17 DIAGNOSIS — M1A9XX0 Chronic gout, unspecified, without tophus (tophi): Secondary | ICD-10-CM | POA: Diagnosis not present

## 2021-01-17 DIAGNOSIS — M150 Primary generalized (osteo)arthritis: Secondary | ICD-10-CM | POA: Diagnosis not present

## 2021-01-17 DIAGNOSIS — E119 Type 2 diabetes mellitus without complications: Secondary | ICD-10-CM | POA: Diagnosis not present

## 2021-01-17 DIAGNOSIS — B372 Candidiasis of skin and nail: Secondary | ICD-10-CM | POA: Diagnosis not present

## 2021-01-17 DIAGNOSIS — I10 Essential (primary) hypertension: Secondary | ICD-10-CM | POA: Diagnosis not present

## 2021-01-18 DIAGNOSIS — M545 Low back pain, unspecified: Secondary | ICD-10-CM | POA: Diagnosis not present

## 2021-01-18 DIAGNOSIS — I10 Essential (primary) hypertension: Secondary | ICD-10-CM | POA: Diagnosis not present

## 2021-01-18 DIAGNOSIS — E119 Type 2 diabetes mellitus without complications: Secondary | ICD-10-CM | POA: Diagnosis not present

## 2021-01-18 DIAGNOSIS — M150 Primary generalized (osteo)arthritis: Secondary | ICD-10-CM | POA: Diagnosis not present

## 2021-01-18 DIAGNOSIS — B372 Candidiasis of skin and nail: Secondary | ICD-10-CM | POA: Diagnosis not present

## 2021-01-18 DIAGNOSIS — M1A9XX0 Chronic gout, unspecified, without tophus (tophi): Secondary | ICD-10-CM | POA: Diagnosis not present

## 2021-01-21 DIAGNOSIS — M150 Primary generalized (osteo)arthritis: Secondary | ICD-10-CM | POA: Diagnosis not present

## 2021-01-21 DIAGNOSIS — M545 Low back pain, unspecified: Secondary | ICD-10-CM | POA: Diagnosis not present

## 2021-01-21 DIAGNOSIS — I10 Essential (primary) hypertension: Secondary | ICD-10-CM | POA: Diagnosis not present

## 2021-01-21 DIAGNOSIS — E119 Type 2 diabetes mellitus without complications: Secondary | ICD-10-CM | POA: Diagnosis not present

## 2021-01-21 DIAGNOSIS — M1A9XX0 Chronic gout, unspecified, without tophus (tophi): Secondary | ICD-10-CM | POA: Diagnosis not present

## 2021-01-21 DIAGNOSIS — B372 Candidiasis of skin and nail: Secondary | ICD-10-CM | POA: Diagnosis not present

## 2021-01-24 DIAGNOSIS — B372 Candidiasis of skin and nail: Secondary | ICD-10-CM | POA: Diagnosis not present

## 2021-01-24 DIAGNOSIS — E119 Type 2 diabetes mellitus without complications: Secondary | ICD-10-CM | POA: Diagnosis not present

## 2021-01-24 DIAGNOSIS — M545 Low back pain, unspecified: Secondary | ICD-10-CM | POA: Diagnosis not present

## 2021-01-24 DIAGNOSIS — M1A9XX0 Chronic gout, unspecified, without tophus (tophi): Secondary | ICD-10-CM | POA: Diagnosis not present

## 2021-01-24 DIAGNOSIS — M150 Primary generalized (osteo)arthritis: Secondary | ICD-10-CM | POA: Diagnosis not present

## 2021-01-24 DIAGNOSIS — I10 Essential (primary) hypertension: Secondary | ICD-10-CM | POA: Diagnosis not present

## 2021-01-25 DIAGNOSIS — B351 Tinea unguium: Secondary | ICD-10-CM | POA: Diagnosis not present

## 2021-01-25 DIAGNOSIS — M150 Primary generalized (osteo)arthritis: Secondary | ICD-10-CM | POA: Diagnosis not present

## 2021-01-25 DIAGNOSIS — R2681 Unsteadiness on feet: Secondary | ICD-10-CM | POA: Diagnosis not present

## 2021-01-25 DIAGNOSIS — M545 Low back pain, unspecified: Secondary | ICD-10-CM | POA: Diagnosis not present

## 2021-01-25 DIAGNOSIS — L605 Yellow nail syndrome: Secondary | ICD-10-CM | POA: Diagnosis not present

## 2021-01-25 DIAGNOSIS — I10 Essential (primary) hypertension: Secondary | ICD-10-CM | POA: Diagnosis not present

## 2021-01-25 DIAGNOSIS — E119 Type 2 diabetes mellitus without complications: Secondary | ICD-10-CM | POA: Diagnosis not present

## 2021-01-25 DIAGNOSIS — E1142 Type 2 diabetes mellitus with diabetic polyneuropathy: Secondary | ICD-10-CM | POA: Diagnosis not present

## 2021-01-25 DIAGNOSIS — B372 Candidiasis of skin and nail: Secondary | ICD-10-CM | POA: Diagnosis not present

## 2021-01-25 DIAGNOSIS — M1A9XX0 Chronic gout, unspecified, without tophus (tophi): Secondary | ICD-10-CM | POA: Diagnosis not present

## 2021-01-25 DIAGNOSIS — M79674 Pain in right toe(s): Secondary | ICD-10-CM | POA: Diagnosis not present

## 2021-01-26 DIAGNOSIS — B372 Candidiasis of skin and nail: Secondary | ICD-10-CM | POA: Diagnosis not present

## 2021-01-26 DIAGNOSIS — I10 Essential (primary) hypertension: Secondary | ICD-10-CM | POA: Diagnosis not present

## 2021-01-26 DIAGNOSIS — M1A9XX0 Chronic gout, unspecified, without tophus (tophi): Secondary | ICD-10-CM | POA: Diagnosis not present

## 2021-01-26 DIAGNOSIS — M150 Primary generalized (osteo)arthritis: Secondary | ICD-10-CM | POA: Diagnosis not present

## 2021-01-26 DIAGNOSIS — M545 Low back pain, unspecified: Secondary | ICD-10-CM | POA: Diagnosis not present

## 2021-01-26 DIAGNOSIS — E119 Type 2 diabetes mellitus without complications: Secondary | ICD-10-CM | POA: Diagnosis not present

## 2021-01-27 DIAGNOSIS — M150 Primary generalized (osteo)arthritis: Secondary | ICD-10-CM | POA: Diagnosis not present

## 2021-01-27 DIAGNOSIS — B372 Candidiasis of skin and nail: Secondary | ICD-10-CM | POA: Diagnosis not present

## 2021-01-27 DIAGNOSIS — M1A9XX0 Chronic gout, unspecified, without tophus (tophi): Secondary | ICD-10-CM | POA: Diagnosis not present

## 2021-01-27 DIAGNOSIS — M545 Low back pain, unspecified: Secondary | ICD-10-CM | POA: Diagnosis not present

## 2021-01-27 DIAGNOSIS — I10 Essential (primary) hypertension: Secondary | ICD-10-CM | POA: Diagnosis not present

## 2021-01-27 DIAGNOSIS — E119 Type 2 diabetes mellitus without complications: Secondary | ICD-10-CM | POA: Diagnosis not present

## 2021-01-31 DIAGNOSIS — E782 Mixed hyperlipidemia: Secondary | ICD-10-CM | POA: Diagnosis not present

## 2021-01-31 DIAGNOSIS — B372 Candidiasis of skin and nail: Secondary | ICD-10-CM | POA: Diagnosis not present

## 2021-01-31 DIAGNOSIS — M1A9XX0 Chronic gout, unspecified, without tophus (tophi): Secondary | ICD-10-CM | POA: Diagnosis not present

## 2021-01-31 DIAGNOSIS — M545 Low back pain, unspecified: Secondary | ICD-10-CM | POA: Diagnosis not present

## 2021-01-31 DIAGNOSIS — M150 Primary generalized (osteo)arthritis: Secondary | ICD-10-CM | POA: Diagnosis not present

## 2021-01-31 DIAGNOSIS — E119 Type 2 diabetes mellitus without complications: Secondary | ICD-10-CM | POA: Diagnosis not present

## 2021-01-31 DIAGNOSIS — I10 Essential (primary) hypertension: Secondary | ICD-10-CM | POA: Diagnosis not present

## 2021-02-02 DIAGNOSIS — M545 Low back pain, unspecified: Secondary | ICD-10-CM | POA: Diagnosis not present

## 2021-02-02 DIAGNOSIS — B372 Candidiasis of skin and nail: Secondary | ICD-10-CM | POA: Diagnosis not present

## 2021-02-02 DIAGNOSIS — E119 Type 2 diabetes mellitus without complications: Secondary | ICD-10-CM | POA: Diagnosis not present

## 2021-02-02 DIAGNOSIS — M150 Primary generalized (osteo)arthritis: Secondary | ICD-10-CM | POA: Diagnosis not present

## 2021-02-02 DIAGNOSIS — I10 Essential (primary) hypertension: Secondary | ICD-10-CM | POA: Diagnosis not present

## 2021-02-02 DIAGNOSIS — M1A9XX0 Chronic gout, unspecified, without tophus (tophi): Secondary | ICD-10-CM | POA: Diagnosis not present

## 2021-02-04 DIAGNOSIS — M150 Primary generalized (osteo)arthritis: Secondary | ICD-10-CM | POA: Diagnosis not present

## 2021-02-04 DIAGNOSIS — I10 Essential (primary) hypertension: Secondary | ICD-10-CM | POA: Diagnosis not present

## 2021-02-04 DIAGNOSIS — M1A9XX0 Chronic gout, unspecified, without tophus (tophi): Secondary | ICD-10-CM | POA: Diagnosis not present

## 2021-02-04 DIAGNOSIS — B372 Candidiasis of skin and nail: Secondary | ICD-10-CM | POA: Diagnosis not present

## 2021-02-04 DIAGNOSIS — M545 Low back pain, unspecified: Secondary | ICD-10-CM | POA: Diagnosis not present

## 2021-02-04 DIAGNOSIS — E119 Type 2 diabetes mellitus without complications: Secondary | ICD-10-CM | POA: Diagnosis not present

## 2021-02-07 DIAGNOSIS — E119 Type 2 diabetes mellitus without complications: Secondary | ICD-10-CM | POA: Diagnosis not present

## 2021-02-07 DIAGNOSIS — I10 Essential (primary) hypertension: Secondary | ICD-10-CM | POA: Diagnosis not present

## 2021-02-07 DIAGNOSIS — B372 Candidiasis of skin and nail: Secondary | ICD-10-CM | POA: Diagnosis not present

## 2021-02-07 DIAGNOSIS — M545 Low back pain, unspecified: Secondary | ICD-10-CM | POA: Diagnosis not present

## 2021-02-07 DIAGNOSIS — M1A9XX0 Chronic gout, unspecified, without tophus (tophi): Secondary | ICD-10-CM | POA: Diagnosis not present

## 2021-02-07 DIAGNOSIS — M150 Primary generalized (osteo)arthritis: Secondary | ICD-10-CM | POA: Diagnosis not present

## 2021-02-09 DIAGNOSIS — I10 Essential (primary) hypertension: Secondary | ICD-10-CM | POA: Diagnosis not present

## 2021-02-09 DIAGNOSIS — B372 Candidiasis of skin and nail: Secondary | ICD-10-CM | POA: Diagnosis not present

## 2021-02-09 DIAGNOSIS — E119 Type 2 diabetes mellitus without complications: Secondary | ICD-10-CM | POA: Diagnosis not present

## 2021-02-09 DIAGNOSIS — M150 Primary generalized (osteo)arthritis: Secondary | ICD-10-CM | POA: Diagnosis not present

## 2021-02-09 DIAGNOSIS — M1A9XX0 Chronic gout, unspecified, without tophus (tophi): Secondary | ICD-10-CM | POA: Diagnosis not present

## 2021-02-09 DIAGNOSIS — M545 Low back pain, unspecified: Secondary | ICD-10-CM | POA: Diagnosis not present

## 2021-02-10 DIAGNOSIS — E119 Type 2 diabetes mellitus without complications: Secondary | ICD-10-CM | POA: Diagnosis not present

## 2021-02-10 DIAGNOSIS — M545 Low back pain, unspecified: Secondary | ICD-10-CM | POA: Diagnosis not present

## 2021-02-10 DIAGNOSIS — M1A9XX0 Chronic gout, unspecified, without tophus (tophi): Secondary | ICD-10-CM | POA: Diagnosis not present

## 2021-02-10 DIAGNOSIS — M150 Primary generalized (osteo)arthritis: Secondary | ICD-10-CM | POA: Diagnosis not present

## 2021-02-10 DIAGNOSIS — B372 Candidiasis of skin and nail: Secondary | ICD-10-CM | POA: Diagnosis not present

## 2021-02-10 DIAGNOSIS — I10 Essential (primary) hypertension: Secondary | ICD-10-CM | POA: Diagnosis not present

## 2021-02-15 DIAGNOSIS — M545 Low back pain, unspecified: Secondary | ICD-10-CM | POA: Diagnosis not present

## 2021-02-15 DIAGNOSIS — B372 Candidiasis of skin and nail: Secondary | ICD-10-CM | POA: Diagnosis not present

## 2021-02-15 DIAGNOSIS — I10 Essential (primary) hypertension: Secondary | ICD-10-CM | POA: Diagnosis not present

## 2021-02-15 DIAGNOSIS — M1A9XX0 Chronic gout, unspecified, without tophus (tophi): Secondary | ICD-10-CM | POA: Diagnosis not present

## 2021-02-15 DIAGNOSIS — M150 Primary generalized (osteo)arthritis: Secondary | ICD-10-CM | POA: Diagnosis not present

## 2021-02-15 DIAGNOSIS — E119 Type 2 diabetes mellitus without complications: Secondary | ICD-10-CM | POA: Diagnosis not present

## 2021-02-18 DIAGNOSIS — M150 Primary generalized (osteo)arthritis: Secondary | ICD-10-CM | POA: Diagnosis not present

## 2021-02-18 DIAGNOSIS — M1A9XX0 Chronic gout, unspecified, without tophus (tophi): Secondary | ICD-10-CM | POA: Diagnosis not present

## 2021-02-18 DIAGNOSIS — I10 Essential (primary) hypertension: Secondary | ICD-10-CM | POA: Diagnosis not present

## 2021-02-18 DIAGNOSIS — B372 Candidiasis of skin and nail: Secondary | ICD-10-CM | POA: Diagnosis not present

## 2021-02-18 DIAGNOSIS — M545 Low back pain, unspecified: Secondary | ICD-10-CM | POA: Diagnosis not present

## 2021-02-18 DIAGNOSIS — E119 Type 2 diabetes mellitus without complications: Secondary | ICD-10-CM | POA: Diagnosis not present

## 2021-02-22 DIAGNOSIS — I10 Essential (primary) hypertension: Secondary | ICD-10-CM | POA: Diagnosis not present

## 2021-02-22 DIAGNOSIS — M1A9XX0 Chronic gout, unspecified, without tophus (tophi): Secondary | ICD-10-CM | POA: Diagnosis not present

## 2021-02-22 DIAGNOSIS — B372 Candidiasis of skin and nail: Secondary | ICD-10-CM | POA: Diagnosis not present

## 2021-02-22 DIAGNOSIS — E119 Type 2 diabetes mellitus without complications: Secondary | ICD-10-CM | POA: Diagnosis not present

## 2021-02-22 DIAGNOSIS — M150 Primary generalized (osteo)arthritis: Secondary | ICD-10-CM | POA: Diagnosis not present

## 2021-02-22 DIAGNOSIS — M545 Low back pain, unspecified: Secondary | ICD-10-CM | POA: Diagnosis not present

## 2021-02-24 DIAGNOSIS — M1A9XX0 Chronic gout, unspecified, without tophus (tophi): Secondary | ICD-10-CM | POA: Diagnosis not present

## 2021-02-24 DIAGNOSIS — I10 Essential (primary) hypertension: Secondary | ICD-10-CM | POA: Diagnosis not present

## 2021-02-24 DIAGNOSIS — M545 Low back pain, unspecified: Secondary | ICD-10-CM | POA: Diagnosis not present

## 2021-02-24 DIAGNOSIS — M150 Primary generalized (osteo)arthritis: Secondary | ICD-10-CM | POA: Diagnosis not present

## 2021-02-24 DIAGNOSIS — B372 Candidiasis of skin and nail: Secondary | ICD-10-CM | POA: Diagnosis not present

## 2021-02-24 DIAGNOSIS — E119 Type 2 diabetes mellitus without complications: Secondary | ICD-10-CM | POA: Diagnosis not present

## 2021-03-01 DIAGNOSIS — B372 Candidiasis of skin and nail: Secondary | ICD-10-CM | POA: Diagnosis not present

## 2021-03-01 DIAGNOSIS — M150 Primary generalized (osteo)arthritis: Secondary | ICD-10-CM | POA: Diagnosis not present

## 2021-03-01 DIAGNOSIS — M1A9XX0 Chronic gout, unspecified, without tophus (tophi): Secondary | ICD-10-CM | POA: Diagnosis not present

## 2021-03-01 DIAGNOSIS — M545 Low back pain, unspecified: Secondary | ICD-10-CM | POA: Diagnosis not present

## 2021-03-01 DIAGNOSIS — E119 Type 2 diabetes mellitus without complications: Secondary | ICD-10-CM | POA: Diagnosis not present

## 2021-03-01 DIAGNOSIS — I10 Essential (primary) hypertension: Secondary | ICD-10-CM | POA: Diagnosis not present

## 2021-03-02 DIAGNOSIS — I10 Essential (primary) hypertension: Secondary | ICD-10-CM | POA: Diagnosis not present

## 2021-03-02 DIAGNOSIS — M150 Primary generalized (osteo)arthritis: Secondary | ICD-10-CM | POA: Diagnosis not present

## 2021-03-02 DIAGNOSIS — E119 Type 2 diabetes mellitus without complications: Secondary | ICD-10-CM | POA: Diagnosis not present

## 2021-03-02 DIAGNOSIS — M545 Low back pain, unspecified: Secondary | ICD-10-CM | POA: Diagnosis not present

## 2021-03-02 DIAGNOSIS — M1A9XX0 Chronic gout, unspecified, without tophus (tophi): Secondary | ICD-10-CM | POA: Diagnosis not present

## 2021-03-02 DIAGNOSIS — B372 Candidiasis of skin and nail: Secondary | ICD-10-CM | POA: Diagnosis not present

## 2021-03-09 ENCOUNTER — Emergency Department (HOSPITAL_COMMUNITY)
Admission: EM | Admit: 2021-03-09 | Discharge: 2021-03-09 | Disposition: A | Discharge status: Home / Self Care | Payer: Medicare Other | Attending: Emergency Medicine | Admitting: Emergency Medicine

## 2021-03-09 ENCOUNTER — Emergency Department (HOSPITAL_COMMUNITY): Payer: Medicare Other

## 2021-03-09 DIAGNOSIS — E119 Type 2 diabetes mellitus without complications: Secondary | ICD-10-CM | POA: Insufficient documentation

## 2021-03-09 DIAGNOSIS — Z7401 Bed confinement status: Secondary | ICD-10-CM | POA: Diagnosis not present

## 2021-03-09 DIAGNOSIS — Z79899 Other long term (current) drug therapy: Secondary | ICD-10-CM | POA: Diagnosis not present

## 2021-03-09 DIAGNOSIS — Z7982 Long term (current) use of aspirin: Secondary | ICD-10-CM | POA: Diagnosis not present

## 2021-03-09 DIAGNOSIS — Z7984 Long term (current) use of oral hypoglycemic drugs: Secondary | ICD-10-CM | POA: Insufficient documentation

## 2021-03-09 DIAGNOSIS — S0990XA Unspecified injury of head, initial encounter: Secondary | ICD-10-CM | POA: Diagnosis not present

## 2021-03-09 DIAGNOSIS — W19XXXA Unspecified fall, initial encounter: Secondary | ICD-10-CM

## 2021-03-09 DIAGNOSIS — S199XXA Unspecified injury of neck, initial encounter: Secondary | ICD-10-CM | POA: Diagnosis not present

## 2021-03-09 DIAGNOSIS — R262 Difficulty in walking, not elsewhere classified: Secondary | ICD-10-CM | POA: Diagnosis not present

## 2021-03-09 DIAGNOSIS — M4312 Spondylolisthesis, cervical region: Secondary | ICD-10-CM | POA: Diagnosis not present

## 2021-03-09 DIAGNOSIS — I10 Essential (primary) hypertension: Secondary | ICD-10-CM | POA: Diagnosis not present

## 2021-03-09 DIAGNOSIS — W228XXA Striking against or struck by other objects, initial encounter: Secondary | ICD-10-CM | POA: Insufficient documentation

## 2021-03-09 DIAGNOSIS — R404 Transient alteration of awareness: Secondary | ICD-10-CM | POA: Diagnosis not present

## 2021-03-09 DIAGNOSIS — M47812 Spondylosis without myelopathy or radiculopathy, cervical region: Secondary | ICD-10-CM | POA: Diagnosis not present

## 2021-03-09 DIAGNOSIS — Z743 Need for continuous supervision: Secondary | ICD-10-CM | POA: Diagnosis not present

## 2021-03-09 DIAGNOSIS — S0093XA Contusion of unspecified part of head, initial encounter: Secondary | ICD-10-CM | POA: Insufficient documentation

## 2021-03-09 LAB — CBC WITH DIFFERENTIAL/PLATELET
Abs Immature Granulocytes: 0.02 10*3/uL (ref 0.00–0.07)
Basophils Absolute: 0.1 10*3/uL (ref 0.0–0.1)
Basophils Relative: 1 %
Eosinophils Absolute: 0.3 10*3/uL (ref 0.0–0.5)
Eosinophils Relative: 3 %
HCT: 41.1 % (ref 39.0–52.0)
Hemoglobin: 12.9 g/dL — ABNORMAL LOW (ref 13.0–17.0)
Immature Granulocytes: 0 %
Lymphocytes Relative: 30 %
Lymphs Abs: 2.8 10*3/uL (ref 0.7–4.0)
MCH: 29.9 pg (ref 26.0–34.0)
MCHC: 31.4 g/dL (ref 30.0–36.0)
MCV: 95.1 fL (ref 80.0–100.0)
Monocytes Absolute: 0.7 10*3/uL (ref 0.1–1.0)
Monocytes Relative: 7 %
Neutro Abs: 5.6 10*3/uL (ref 1.7–7.7)
Neutrophils Relative %: 59 %
Platelets: 247 10*3/uL (ref 150–400)
RBC: 4.32 MIL/uL (ref 4.22–5.81)
RDW: 15.8 % — ABNORMAL HIGH (ref 11.5–15.5)
WBC: 9.4 10*3/uL (ref 4.0–10.5)
nRBC: 0 % (ref 0.0–0.2)

## 2021-03-09 LAB — COMPREHENSIVE METABOLIC PANEL
ALT: 15 U/L (ref 0–44)
AST: 20 U/L (ref 15–41)
Albumin: 3.5 g/dL (ref 3.5–5.0)
Alkaline Phosphatase: 87 U/L (ref 38–126)
Anion gap: 4 — ABNORMAL LOW (ref 5–15)
BUN: 35 mg/dL — ABNORMAL HIGH (ref 8–23)
CO2: 22 mmol/L (ref 22–32)
Calcium: 8.7 mg/dL — ABNORMAL LOW (ref 8.9–10.3)
Chloride: 110 mmol/L (ref 98–111)
Creatinine, Ser: 1.23 mg/dL (ref 0.61–1.24)
GFR, Estimated: 57 mL/min — ABNORMAL LOW (ref >60)
Glucose, Bld: 107 mg/dL — ABNORMAL HIGH (ref 70–99)
Potassium: 5.2 mmol/L — ABNORMAL HIGH (ref 3.5–5.1)
Sodium: 136 mmol/L (ref 135–145)
Total Bilirubin: 0.4 mg/dL (ref 0.3–1.2)
Total Protein: 7.7 g/dL (ref 6.5–8.1)

## 2021-03-09 NOTE — ED Triage Notes (Signed)
BIB [facility] after [facility] facility called to report pt had fallen 2 days ago. Per EMS, pt hit his head during that time and [place] wanted further evaluation. Pt is reported to be at baseline A & O x 3.

## 2021-03-09 NOTE — ED Provider Notes (Signed)
[facility] EMERGENCY DEPARTMENT Provider Note   CSN: [id number] Arrival date & time: 03/09/21  1255     History  Chief Complaint  Patient presents with   William Mullins is a 88 y.o. male.  Mechanical fall yesterday.  Lives at assisted living facility.  Did not want to go to the hospital for evaluation but was convinced to come today.  He denies any pain.  He uses a walker at times to walk but also has a wheelchair.  He is not on blood thinners.  History of ambulation issues.  Has had knee surgeries in the past.  Does not have any extremity pain.  He states that he is able to walk this morning with his walker without much issues.  The history is provided by the patient.  Fall This is a new problem. The current episode started yesterday. The problem has not changed since onset.Pertinent negatives include no chest pain, no abdominal pain, no headaches and no shortness of breath. Nothing aggravates the symptoms. Nothing relieves the symptoms. He has tried nothing for the symptoms. The treatment provided no relief.      Home Medications Prior to Admission medications   Medication Sig Start Date End Date Taking? Authorizing Provider  allopurinol (ZYLOPRIM) 100 MG tablet Take 100 mg by mouth daily. 03/17/15   [provider]  amLODipine (NORVASC) 5 MG tablet Take 5 mg by mouth daily.    [provider]  Ascorbic Acid (VITAMIN C) 1000 MG tablet Take 1,000 mg by mouth daily.    [provider]  aspirin EC 81 MG tablet Take 81 mg by mouth daily.    [provider]  atorvastatin (LIPITOR) 10 MG tablet Take 10 mg by mouth at bedtime.    [provider]  benazepril (LOTENSIN) 10 MG tablet Take 10 mg by mouth daily.    [provider]  docusate sodium (COLACE) 100 MG capsule Take 100 mg by mouth at bedtime.    [provider]  Ergocalciferol (VITAMIN D2) 50 MCG (2000 UT) TABS Take 2,000 Units by mouth daily.     [provider]  Melatonin 10 MG TABS Take 10 mg by mouth at bedtime.    [provider]  metFORMIN (GLUCOPHAGE) 500 MG tablet Take 500 mg by mouth daily.    [provider]  Multiple Vitamin (MULTIVITAMIN WITH MINERALS) TABS tablet Take 1 tablet by mouth daily.    [provider]  oxyCODONE-acetaminophen (PERCOCET/ROXICET) 5-325 MG tablet Take by mouth every 8 (eight) hours as needed for severe pain.    [provider]  vitamin E 400 UNIT capsule Take 400 Units by mouth daily.    [provider]      Allergies    Patient has no known allergies.    Review of Systems   Review of Systems  Respiratory:  Negative for shortness of breath.   Cardiovascular:  Negative for chest pain.  Gastrointestinal:  Negative for abdominal pain.  Neurological:  Negative for headaches.   Physical Exam Updated Vital Signs BP 123/70 (BP Location: Left Arm)    Pulse (!) 112    Temp 98.7 F (37.1 C) (Oral)    Resp 16    SpO2 100%  Physical Exam Vitals and nursing note reviewed.  Constitutional:      General: He is not in acute distress.    Appearance: He is well-developed.  HENT:     Head:  Comments: Bruising over the right side of the head    Nose: Nose normal.     Mouth/Throat:     Mouth: Mucous membranes are moist.  Eyes:     Extraocular Movements: Extraocular movements intact.     Conjunctiva/sclera: Conjunctivae normal.     Pupils: Pupils are equal, round, and reactive to light.  Cardiovascular:     Rate and Rhythm: Normal rate and regular rhythm.     Heart sounds: No murmur heard. Pulmonary:     Effort: Pulmonary effort is normal. No respiratory distress.     Breath sounds: Normal breath sounds.  Abdominal:     Palpations: Abdomen is soft.     Tenderness: There is no abdominal tenderness.  Musculoskeletal:        General: No swelling or tenderness.     Cervical back: Normal range of motion and neck supple.     Comments: No  midline spinal tenderness, no tenderness to extremities  Skin:    General: Skin is warm and dry.     Capillary Refill: Capillary refill takes less than 2 seconds.  Neurological:     General: No focal deficit present.     Mental Status: He is alert and oriented to person, place, and time.     Cranial Nerves: No cranial nerve deficit.     Sensory: No sensory deficit.     Motor: No weakness.     Coordination: Coordination normal.     Comments: 5+ out of 5 strength throughout, normal sensation, no drift, normal finger-nose-finger, normal speech  Psychiatric:        Mood and Affect: Mood normal.    ED Results / Procedures / Treatments   Labs (all labs ordered are listed, but only abnormal results are displayed) Labs Reviewed  CBC WITH DIFFERENTIAL/PLATELET - Abnormal; Notable for the following components:      Result Value   Hemoglobin 12.9 (*)    RDW 15.8 (*)    All other components within normal limits  COMPREHENSIVE METABOLIC PANEL - Abnormal; Notable for the following components:   Potassium 5.2 (*)    Glucose, Bld 107 (*)    BUN 35 (*)    Calcium 8.7 (*)    GFR, Estimated 57 (*)    Anion gap 4 (*)    All other components within normal limits    EKG None  Radiology CT Head Wo Contrast  Result Date: 03/09/2021 CLINICAL DATA:  Fall.  Neck trauma.  Hit head. EXAM: CT HEAD WITHOUT CONTRAST CT CERVICAL SPINE WITHOUT CONTRAST TECHNIQUE: Multidetector CT imaging of the head and cervical spine was performed following the standard protocol without intravenous contrast. Multiplanar CT image reconstructions of the cervical spine were also generated. RADIATION DOSE REDUCTION: This exam was performed according to the departmental dose-optimization program which includes automated exposure control, adjustment of the mA and/or kV according to patient size and/or use of iterative reconstruction technique. COMPARISON:  Head CT 12/02/2020.  Cervical spine CT 03/21/2015. FINDINGS: CT HEAD  FINDINGS Brain: There is no evidence for acute hemorrhage, hydrocephalus, mass lesion, or abnormal extra-axial fluid collection. No definite CT evidence for acute infarction. Diffuse loss of parenchymal volume is consistent with atrophy. Patchy low attenuation in the deep hemispheric and periventricular white matter is nonspecific, but likely reflects chronic microvascular ischemic demyelination. Vascular: No hyperdense vessel or unexpected calcification. Skull: No evidence for fracture. No worrisome lytic or sclerotic lesion. Sinuses/Orbits: The visualized paranasal sinuses and mastoid air cells are clear. Visualized portions  of the globes and intraorbital fat are unremarkable. Other: None. CT CERVICAL SPINE FINDINGS Alignment: Reversal of normal cervical lordosis. Trace anterolisthesis of C3 on 4 is stable to minimally progressive in the interval and remains compatible with the degree of facet osteoarthritis at the same level. Skull base and vertebrae: No acute fracture. No primary bone lesion or focal pathologic process. Soft tissues and spinal canal: No prevertebral fluid or swelling. No visible canal hematoma. Disc levels: Marked loss of disc height noted all levels from C4-5 to C7-T1. Patient is probably fused across the C4-5 and C5-6 interspaces. These changes are associated with advanced endplate and facet degeneration. Marked degenerative changes noted at the C1-2 articulation. Upper chest: Unremarkable. Other: Non. IMPRESSION: 1. Stable head CT.  No acute intracranial abnormality. 2. Atrophy with chronic small vessel ischemic disease. 3. Stable cervical spine CT. No evidence for cervical spine fracture or traumatic subluxation. 4. Advanced degenerative changes in the cervical spine as above. Electronically Signed   By: Misty Stanley M.D.   On: 03/09/2021 14:26   CT Cervical Spine Wo Contrast  Result Date: 03/09/2021 CLINICAL DATA:  Fall.  Neck trauma.  Hit head. EXAM: CT HEAD WITHOUT CONTRAST CT  CERVICAL SPINE WITHOUT CONTRAST TECHNIQUE: Multidetector CT imaging of the head and cervical spine was performed following the standard protocol without intravenous contrast. Multiplanar CT image reconstructions of the cervical spine were also generated. RADIATION DOSE REDUCTION: This exam was performed according to the departmental dose-optimization program which includes automated exposure control, adjustment of the mA and/or kV according to patient size and/or use of iterative reconstruction technique. COMPARISON:  Head CT 12/02/2020.  Cervical spine CT 03/21/2015. FINDINGS: CT HEAD FINDINGS Brain: There is no evidence for acute hemorrhage, hydrocephalus, mass lesion, or abnormal extra-axial fluid collection. No definite CT evidence for acute infarction. Diffuse loss of parenchymal volume is consistent with atrophy. Patchy low attenuation in the deep hemispheric and periventricular white matter is nonspecific, but likely reflects chronic microvascular ischemic demyelination. Vascular: No hyperdense vessel or unexpected calcification. Skull: No evidence for fracture. No worrisome lytic or sclerotic lesion. Sinuses/Orbits: The visualized paranasal sinuses and mastoid air cells are clear. Visualized portions of the globes and intraorbital fat are unremarkable. Other: None. CT CERVICAL SPINE FINDINGS Alignment: Reversal of normal cervical lordosis. Trace anterolisthesis of C3 on 4 is stable to minimally progressive in the interval and remains compatible with the degree of facet osteoarthritis at the same level. Skull base and vertebrae: No acute fracture. No primary bone lesion or focal pathologic process. Soft tissues and spinal canal: No prevertebral fluid or swelling. No visible canal hematoma. Disc levels: Marked loss of disc height noted all levels from C4-5 to C7-T1. Patient is probably fused across the C4-5 and C5-6 interspaces. These changes are associated with advanced endplate and facet degeneration. Marked  degenerative changes noted at the C1-2 articulation. Upper chest: Unremarkable. Other: Non. IMPRESSION: 1. Stable head CT.  No acute intracranial abnormality. 2. Atrophy with chronic small vessel ischemic disease. 3. Stable cervical spine CT. No evidence for cervical spine fracture or traumatic subluxation. 4. Advanced degenerative changes in the cervical spine as above. Electronically Signed   By: Misty Stanley M.D.   On: 03/09/2021 14:26    Procedures Procedures    Medications Ordered in ED Medications - No data to display  ED Course/ Medical Decision Making/ A&P  Medical Decision Making  GOVIND FUREY is an 88 year old male with history of diabetes, hypertension, arthritis who presents the ED after mechanical fall.  Unremarkable vitals.  No fever.  Patient had a fall yesterday and may be the day before.  Hit the right side of his head.  Did not lose consciousness.  He was ambulatory this morning without any issues but after refusing to come to the hospital yesterday was convinced to come today.  He does not have any extremity pain.  He does have some bruising over the right side of his head.  He has normal neurological exam.  He is not on blood thinners.  He has chronic arthritis.  He uses both a walker and wheelchair at home.  Given his fall and evidence of head trauma we will get a head CT and neck CT.  We will check basic labs.  Denies any infectious symptoms.  Differential diagnosis includes head trauma versus contusion versus neck trauma.  Will evaluate for electrolyte abnormalities.  My interpretation of the head CT shows no acute injuries.  No head bleed.  Per radiology report CT of head and neck are unremarkable.  My review of the labs show no significant findings.  No significant electrolyte abnormality, kidney injury, leukocytosis.  Patient has no extremity tenderness.  He has been ambulatory.  He is safe for discharge.  No need for admission for any acute  process at this time.  Recommend that he continue to use his walker or wheelchair and ask for help when needed.  I updated family on the phone as well.  This chart was dictated using voice recognition software.  Despite best efforts to proofread,  errors can occur which can change the documentation meaning.         Final Clinical Impression(s) / ED Diagnoses Final diagnoses:  Contusion of head, unspecified part of head, initial encounter  Fall, initial encounter    Rx / DC Orders ED Discharge Orders     None         Lennice Sites, DO 03/09/21 1546

## 2021-03-09 NOTE — ED Notes (Signed)
[facility] called to pick up patient

## 2021-03-09 NOTE — ED Provider Triage Note (Signed)
Emergency Medicine Provider Triage Evaluation Note  William Mullins , a 88 y.o. male  was evaluated in triage.  Pt complains of multiple falls. He was reportedly brought in by his Collier [place]ina from [place] after he had fallen 2 days ago.  Patient states he fell twice and hit his head.  Patient states he fell headfirst.  According to triage note his baseline is ANO x3. Patient states that he did have pain in his head when he first fell however that went away after 10 minutes.  He denies any other injuries from the fall.  No CP/Shob.   He thinks he tripped over his own feet.   Review of Systems  Positive: See above.  Negative:   Physical Exam  BP 123/70 (BP Location: Left Arm)    Pulse (!) 112    Temp 98.7 F (37.1 C) (Oral)    Resp 16    SpO2 100%  Gen:   Awake, no distress   Resp:  Normal effort  MSK:   Moves extremities without difficulty  Other:  Contusion on the left side of the head.    Medical Decision Making  Medically screening exam initiated at 1:25 PM.  Appropriate orders placed.  William Mullins was informed that the remainder of the evaluation will be completed by another provider, this initial triage assessment does not replace that evaluation, and the importance of remaining in the ED until their evaluation is complete.  As patient appears to have struck his head will obtain CTs of head and neck based on age. His heart rate is elevated at 112.  Uncertain of the significance however given he has reportedly had 2 falls recently we will also check EKG, basic labs and urine.  Note: Portions of this report may have been transcribed using voice recognition software. Every effort was made to ensure accuracy; however, inadvertent computerized transcription errors may be present    Lorin Glass, PA-C 03/09/21 1331

## 2021-03-09 NOTE — Discharge Instructions (Signed)
CT scans of the head and neck today were unremarkable.  Lab work unremarkable.  Make sure patient using wheelchair when needed.

## 2021-03-14 DIAGNOSIS — E039 Hypothyroidism, unspecified: Secondary | ICD-10-CM | POA: Diagnosis not present

## 2021-03-14 DIAGNOSIS — M1000 Idiopathic gout, unspecified site: Secondary | ICD-10-CM | POA: Diagnosis not present

## 2021-03-14 DIAGNOSIS — E782 Mixed hyperlipidemia: Secondary | ICD-10-CM | POA: Diagnosis not present

## 2021-03-14 DIAGNOSIS — M1A9XX0 Chronic gout, unspecified, without tophus (tophi): Secondary | ICD-10-CM | POA: Diagnosis not present

## 2021-03-14 DIAGNOSIS — I10 Essential (primary) hypertension: Secondary | ICD-10-CM | POA: Diagnosis not present

## 2021-03-14 DIAGNOSIS — E785 Hyperlipidemia, unspecified: Secondary | ICD-10-CM | POA: Diagnosis not present

## 2021-03-14 DIAGNOSIS — E119 Type 2 diabetes mellitus without complications: Secondary | ICD-10-CM | POA: Diagnosis not present

## 2021-04-04 DIAGNOSIS — H109 Unspecified conjunctivitis: Secondary | ICD-10-CM | POA: Diagnosis not present

## 2021-04-04 DIAGNOSIS — I10 Essential (primary) hypertension: Secondary | ICD-10-CM | POA: Diagnosis not present

## 2021-04-04 DIAGNOSIS — E782 Mixed hyperlipidemia: Secondary | ICD-10-CM | POA: Diagnosis not present

## 2021-04-11 DIAGNOSIS — E782 Mixed hyperlipidemia: Secondary | ICD-10-CM | POA: Diagnosis not present

## 2021-04-11 DIAGNOSIS — E119 Type 2 diabetes mellitus without complications: Secondary | ICD-10-CM | POA: Diagnosis not present

## 2021-04-11 DIAGNOSIS — M1A9XX0 Chronic gout, unspecified, without tophus (tophi): Secondary | ICD-10-CM | POA: Diagnosis not present

## 2021-04-11 DIAGNOSIS — I10 Essential (primary) hypertension: Secondary | ICD-10-CM | POA: Diagnosis not present

## 2021-04-27 DIAGNOSIS — E782 Mixed hyperlipidemia: Secondary | ICD-10-CM | POA: Diagnosis not present

## 2021-04-27 DIAGNOSIS — M1A9XX0 Chronic gout, unspecified, without tophus (tophi): Secondary | ICD-10-CM | POA: Diagnosis not present

## 2021-04-27 DIAGNOSIS — E119 Type 2 diabetes mellitus without complications: Secondary | ICD-10-CM | POA: Diagnosis not present

## 2021-04-27 DIAGNOSIS — E875 Hyperkalemia: Secondary | ICD-10-CM | POA: Diagnosis not present

## 2021-04-27 DIAGNOSIS — I10 Essential (primary) hypertension: Secondary | ICD-10-CM | POA: Diagnosis not present

## 2021-04-28 DIAGNOSIS — E785 Hyperlipidemia, unspecified: Secondary | ICD-10-CM | POA: Diagnosis not present

## 2021-05-16 DIAGNOSIS — E119 Type 2 diabetes mellitus without complications: Secondary | ICD-10-CM | POA: Diagnosis not present

## 2021-05-16 DIAGNOSIS — E782 Mixed hyperlipidemia: Secondary | ICD-10-CM | POA: Diagnosis not present

## 2021-05-16 DIAGNOSIS — M1A9XX0 Chronic gout, unspecified, without tophus (tophi): Secondary | ICD-10-CM | POA: Diagnosis not present

## 2021-05-16 DIAGNOSIS — I10 Essential (primary) hypertension: Secondary | ICD-10-CM | POA: Diagnosis not present

## 2021-05-16 DIAGNOSIS — E875 Hyperkalemia: Secondary | ICD-10-CM | POA: Diagnosis not present

## 2021-05-17 DIAGNOSIS — E875 Hyperkalemia: Secondary | ICD-10-CM | POA: Diagnosis not present

## 2021-05-17 DIAGNOSIS — D649 Anemia, unspecified: Secondary | ICD-10-CM | POA: Diagnosis not present

## 2021-05-30 DIAGNOSIS — I10 Essential (primary) hypertension: Secondary | ICD-10-CM | POA: Diagnosis not present

## 2021-05-30 DIAGNOSIS — E119 Type 2 diabetes mellitus without complications: Secondary | ICD-10-CM | POA: Diagnosis not present

## 2021-05-30 DIAGNOSIS — E782 Mixed hyperlipidemia: Secondary | ICD-10-CM | POA: Diagnosis not present

## 2021-05-30 DIAGNOSIS — E875 Hyperkalemia: Secondary | ICD-10-CM | POA: Diagnosis not present

## 2021-05-30 DIAGNOSIS — M1A9XX0 Chronic gout, unspecified, without tophus (tophi): Secondary | ICD-10-CM | POA: Diagnosis not present

## 2021-06-08 DIAGNOSIS — E119 Type 2 diabetes mellitus without complications: Secondary | ICD-10-CM | POA: Diagnosis not present

## 2021-06-08 DIAGNOSIS — Z993 Dependence on wheelchair: Secondary | ICD-10-CM | POA: Diagnosis not present

## 2021-06-08 DIAGNOSIS — E875 Hyperkalemia: Secondary | ICD-10-CM | POA: Diagnosis not present

## 2021-06-08 DIAGNOSIS — I10 Essential (primary) hypertension: Secondary | ICD-10-CM | POA: Diagnosis not present

## 2021-06-09 DIAGNOSIS — Z79899 Other long term (current) drug therapy: Secondary | ICD-10-CM | POA: Diagnosis not present

## 2021-06-13 DIAGNOSIS — E119 Type 2 diabetes mellitus without complications: Secondary | ICD-10-CM | POA: Diagnosis not present

## 2021-06-13 DIAGNOSIS — M1A9XX0 Chronic gout, unspecified, without tophus (tophi): Secondary | ICD-10-CM | POA: Diagnosis not present

## 2021-06-13 DIAGNOSIS — E875 Hyperkalemia: Secondary | ICD-10-CM | POA: Diagnosis not present

## 2021-06-13 DIAGNOSIS — E782 Mixed hyperlipidemia: Secondary | ICD-10-CM | POA: Diagnosis not present

## 2021-06-13 DIAGNOSIS — I10 Essential (primary) hypertension: Secondary | ICD-10-CM | POA: Diagnosis not present

## 2021-06-16 DIAGNOSIS — Z79899 Other long term (current) drug therapy: Secondary | ICD-10-CM | POA: Diagnosis not present

## 2021-06-16 DIAGNOSIS — E785 Hyperlipidemia, unspecified: Secondary | ICD-10-CM | POA: Diagnosis not present

## 2021-06-16 DIAGNOSIS — E119 Type 2 diabetes mellitus without complications: Secondary | ICD-10-CM | POA: Diagnosis not present

## 2021-06-20 DIAGNOSIS — E875 Hyperkalemia: Secondary | ICD-10-CM | POA: Diagnosis not present

## 2021-06-20 DIAGNOSIS — E119 Type 2 diabetes mellitus without complications: Secondary | ICD-10-CM | POA: Diagnosis not present

## 2021-06-20 DIAGNOSIS — I10 Essential (primary) hypertension: Secondary | ICD-10-CM | POA: Diagnosis not present

## 2021-06-20 DIAGNOSIS — E782 Mixed hyperlipidemia: Secondary | ICD-10-CM | POA: Diagnosis not present

## 2021-06-20 DIAGNOSIS — M1A9XX0 Chronic gout, unspecified, without tophus (tophi): Secondary | ICD-10-CM | POA: Diagnosis not present

## 2021-06-27 DIAGNOSIS — I10 Essential (primary) hypertension: Secondary | ICD-10-CM | POA: Diagnosis not present

## 2021-06-27 DIAGNOSIS — E782 Mixed hyperlipidemia: Secondary | ICD-10-CM | POA: Diagnosis not present

## 2021-06-27 DIAGNOSIS — M180 Bilateral primary osteoarthritis of first carpometacarpal joints: Secondary | ICD-10-CM | POA: Diagnosis not present

## 2021-06-29 DIAGNOSIS — L851 Acquired keratosis [keratoderma] palmaris et plantaris: Secondary | ICD-10-CM | POA: Diagnosis not present

## 2021-06-29 DIAGNOSIS — L603 Nail dystrophy: Secondary | ICD-10-CM | POA: Diagnosis not present

## 2021-06-29 DIAGNOSIS — M79674 Pain in right toe(s): Secondary | ICD-10-CM | POA: Diagnosis not present

## 2021-06-29 DIAGNOSIS — R2681 Unsteadiness on feet: Secondary | ICD-10-CM | POA: Diagnosis not present

## 2021-06-29 DIAGNOSIS — M79675 Pain in left toe(s): Secondary | ICD-10-CM | POA: Diagnosis not present

## 2021-06-29 DIAGNOSIS — E1142 Type 2 diabetes mellitus with diabetic polyneuropathy: Secondary | ICD-10-CM | POA: Diagnosis not present

## 2021-06-29 DIAGNOSIS — B351 Tinea unguium: Secondary | ICD-10-CM | POA: Diagnosis not present

## 2021-06-30 DIAGNOSIS — I10 Essential (primary) hypertension: Secondary | ICD-10-CM | POA: Diagnosis not present

## 2021-07-08 DIAGNOSIS — M180 Bilateral primary osteoarthritis of first carpometacarpal joints: Secondary | ICD-10-CM | POA: Diagnosis not present

## 2021-07-08 DIAGNOSIS — D649 Anemia, unspecified: Secondary | ICD-10-CM | POA: Diagnosis not present

## 2021-07-11 DIAGNOSIS — I10 Essential (primary) hypertension: Secondary | ICD-10-CM | POA: Diagnosis not present

## 2021-07-11 DIAGNOSIS — M1A9XX0 Chronic gout, unspecified, without tophus (tophi): Secondary | ICD-10-CM | POA: Diagnosis not present

## 2021-07-11 DIAGNOSIS — E119 Type 2 diabetes mellitus without complications: Secondary | ICD-10-CM | POA: Diagnosis not present

## 2021-07-11 DIAGNOSIS — E782 Mixed hyperlipidemia: Secondary | ICD-10-CM | POA: Diagnosis not present

## 2021-08-15 DIAGNOSIS — E119 Type 2 diabetes mellitus without complications: Secondary | ICD-10-CM | POA: Diagnosis not present

## 2021-08-15 DIAGNOSIS — M1A9XX0 Chronic gout, unspecified, without tophus (tophi): Secondary | ICD-10-CM | POA: Diagnosis not present

## 2021-08-15 DIAGNOSIS — I10 Essential (primary) hypertension: Secondary | ICD-10-CM | POA: Diagnosis not present

## 2021-08-15 DIAGNOSIS — E782 Mixed hyperlipidemia: Secondary | ICD-10-CM | POA: Diagnosis not present

## 2021-09-18 DIAGNOSIS — E1122 Type 2 diabetes mellitus with diabetic chronic kidney disease: Secondary | ICD-10-CM | POA: Diagnosis not present

## 2021-09-18 DIAGNOSIS — I129 Hypertensive chronic kidney disease with stage 1 through stage 4 chronic kidney disease, or unspecified chronic kidney disease: Secondary | ICD-10-CM | POA: Diagnosis not present

## 2021-09-19 DIAGNOSIS — E782 Mixed hyperlipidemia: Secondary | ICD-10-CM | POA: Diagnosis not present

## 2021-09-19 DIAGNOSIS — E119 Type 2 diabetes mellitus without complications: Secondary | ICD-10-CM | POA: Diagnosis not present

## 2021-09-19 DIAGNOSIS — I10 Essential (primary) hypertension: Secondary | ICD-10-CM | POA: Diagnosis not present

## 2021-10-05 DIAGNOSIS — L603 Nail dystrophy: Secondary | ICD-10-CM | POA: Diagnosis not present

## 2021-10-05 DIAGNOSIS — R2681 Unsteadiness on feet: Secondary | ICD-10-CM | POA: Diagnosis not present

## 2021-10-05 DIAGNOSIS — M79674 Pain in right toe(s): Secondary | ICD-10-CM | POA: Diagnosis not present

## 2021-10-05 DIAGNOSIS — B351 Tinea unguium: Secondary | ICD-10-CM | POA: Diagnosis not present

## 2021-10-05 DIAGNOSIS — L84 Corns and callosities: Secondary | ICD-10-CM | POA: Diagnosis not present

## 2021-10-05 DIAGNOSIS — M79675 Pain in left toe(s): Secondary | ICD-10-CM | POA: Diagnosis not present

## 2021-10-05 DIAGNOSIS — M19071 Primary osteoarthritis, right ankle and foot: Secondary | ICD-10-CM | POA: Diagnosis not present

## 2021-10-16 DIAGNOSIS — I129 Hypertensive chronic kidney disease with stage 1 through stage 4 chronic kidney disease, or unspecified chronic kidney disease: Secondary | ICD-10-CM | POA: Diagnosis not present

## 2021-10-16 DIAGNOSIS — E1122 Type 2 diabetes mellitus with diabetic chronic kidney disease: Secondary | ICD-10-CM | POA: Diagnosis not present

## 2021-10-17 DIAGNOSIS — N182 Chronic kidney disease, stage 2 (mild): Secondary | ICD-10-CM | POA: Diagnosis not present

## 2021-10-17 DIAGNOSIS — M1A9XX0 Chronic gout, unspecified, without tophus (tophi): Secondary | ICD-10-CM | POA: Diagnosis not present

## 2021-10-17 DIAGNOSIS — I129 Hypertensive chronic kidney disease with stage 1 through stage 4 chronic kidney disease, or unspecified chronic kidney disease: Secondary | ICD-10-CM | POA: Diagnosis not present

## 2021-10-17 DIAGNOSIS — E1122 Type 2 diabetes mellitus with diabetic chronic kidney disease: Secondary | ICD-10-CM | POA: Diagnosis not present

## 2021-10-24 DIAGNOSIS — I129 Hypertensive chronic kidney disease with stage 1 through stage 4 chronic kidney disease, or unspecified chronic kidney disease: Secondary | ICD-10-CM | POA: Diagnosis not present

## 2021-10-24 DIAGNOSIS — N182 Chronic kidney disease, stage 2 (mild): Secondary | ICD-10-CM | POA: Diagnosis not present

## 2021-10-24 DIAGNOSIS — M545 Low back pain, unspecified: Secondary | ICD-10-CM | POA: Diagnosis not present

## 2021-11-14 DIAGNOSIS — N182 Chronic kidney disease, stage 2 (mild): Secondary | ICD-10-CM | POA: Diagnosis not present

## 2021-11-14 DIAGNOSIS — E1122 Type 2 diabetes mellitus with diabetic chronic kidney disease: Secondary | ICD-10-CM | POA: Diagnosis not present

## 2021-11-14 DIAGNOSIS — K5903 Drug induced constipation: Secondary | ICD-10-CM | POA: Diagnosis not present

## 2021-11-14 DIAGNOSIS — E782 Mixed hyperlipidemia: Secondary | ICD-10-CM | POA: Diagnosis not present

## 2021-11-14 DIAGNOSIS — I129 Hypertensive chronic kidney disease with stage 1 through stage 4 chronic kidney disease, or unspecified chronic kidney disease: Secondary | ICD-10-CM | POA: Diagnosis not present

## 2021-11-14 DIAGNOSIS — M1A9XX0 Chronic gout, unspecified, without tophus (tophi): Secondary | ICD-10-CM | POA: Diagnosis not present

## 2021-11-15 DIAGNOSIS — I129 Hypertensive chronic kidney disease with stage 1 through stage 4 chronic kidney disease, or unspecified chronic kidney disease: Secondary | ICD-10-CM | POA: Diagnosis not present

## 2021-11-15 DIAGNOSIS — E1122 Type 2 diabetes mellitus with diabetic chronic kidney disease: Secondary | ICD-10-CM | POA: Diagnosis not present

## 2021-11-21 DIAGNOSIS — I129 Hypertensive chronic kidney disease with stage 1 through stage 4 chronic kidney disease, or unspecified chronic kidney disease: Secondary | ICD-10-CM | POA: Diagnosis not present

## 2021-11-21 DIAGNOSIS — M545 Low back pain, unspecified: Secondary | ICD-10-CM | POA: Diagnosis not present

## 2021-11-21 DIAGNOSIS — N182 Chronic kidney disease, stage 2 (mild): Secondary | ICD-10-CM | POA: Diagnosis not present

## 2021-11-24 DIAGNOSIS — K5901 Slow transit constipation: Secondary | ICD-10-CM | POA: Diagnosis not present

## 2021-11-24 DIAGNOSIS — E560 Deficiency of vitamin E: Secondary | ICD-10-CM | POA: Diagnosis not present

## 2021-11-24 DIAGNOSIS — R600 Localized edema: Secondary | ICD-10-CM | POA: Diagnosis not present

## 2021-11-24 DIAGNOSIS — M5459 Other low back pain: Secondary | ICD-10-CM | POA: Diagnosis not present

## 2021-12-19 DIAGNOSIS — M5459 Other low back pain: Secondary | ICD-10-CM | POA: Diagnosis not present

## 2021-12-19 DIAGNOSIS — L98499 Non-pressure chronic ulcer of skin of other sites with unspecified severity: Secondary | ICD-10-CM | POA: Diagnosis not present

## 2021-12-19 DIAGNOSIS — N182 Chronic kidney disease, stage 2 (mild): Secondary | ICD-10-CM | POA: Diagnosis not present

## 2021-12-19 DIAGNOSIS — K5901 Slow transit constipation: Secondary | ICD-10-CM | POA: Diagnosis not present

## 2021-12-19 DIAGNOSIS — R600 Localized edema: Secondary | ICD-10-CM | POA: Diagnosis not present

## 2021-12-21 DIAGNOSIS — F32A Depression, unspecified: Secondary | ICD-10-CM | POA: Diagnosis not present

## 2021-12-21 DIAGNOSIS — M109 Gout, unspecified: Secondary | ICD-10-CM | POA: Diagnosis not present

## 2021-12-21 DIAGNOSIS — I129 Hypertensive chronic kidney disease with stage 1 through stage 4 chronic kidney disease, or unspecified chronic kidney disease: Secondary | ICD-10-CM | POA: Diagnosis not present

## 2021-12-21 DIAGNOSIS — D631 Anemia in chronic kidney disease: Secondary | ICD-10-CM | POA: Diagnosis not present

## 2021-12-21 DIAGNOSIS — E1122 Type 2 diabetes mellitus with diabetic chronic kidney disease: Secondary | ICD-10-CM | POA: Diagnosis not present

## 2021-12-21 DIAGNOSIS — N189 Chronic kidney disease, unspecified: Secondary | ICD-10-CM | POA: Diagnosis not present

## 2021-12-26 DIAGNOSIS — F32A Depression, unspecified: Secondary | ICD-10-CM | POA: Diagnosis not present

## 2021-12-26 DIAGNOSIS — D631 Anemia in chronic kidney disease: Secondary | ICD-10-CM | POA: Diagnosis not present

## 2021-12-26 DIAGNOSIS — N189 Chronic kidney disease, unspecified: Secondary | ICD-10-CM | POA: Diagnosis not present

## 2021-12-26 DIAGNOSIS — I129 Hypertensive chronic kidney disease with stage 1 through stage 4 chronic kidney disease, or unspecified chronic kidney disease: Secondary | ICD-10-CM | POA: Diagnosis not present

## 2021-12-26 DIAGNOSIS — M109 Gout, unspecified: Secondary | ICD-10-CM | POA: Diagnosis not present

## 2021-12-26 DIAGNOSIS — E1122 Type 2 diabetes mellitus with diabetic chronic kidney disease: Secondary | ICD-10-CM | POA: Diagnosis not present

## 2021-12-26 DIAGNOSIS — L89159 Pressure ulcer of sacral region, unspecified stage: Secondary | ICD-10-CM | POA: Diagnosis not present

## 2021-12-29 DIAGNOSIS — N189 Chronic kidney disease, unspecified: Secondary | ICD-10-CM | POA: Diagnosis not present

## 2021-12-29 DIAGNOSIS — F32A Depression, unspecified: Secondary | ICD-10-CM | POA: Diagnosis not present

## 2021-12-29 DIAGNOSIS — D631 Anemia in chronic kidney disease: Secondary | ICD-10-CM | POA: Diagnosis not present

## 2021-12-29 DIAGNOSIS — M109 Gout, unspecified: Secondary | ICD-10-CM | POA: Diagnosis not present

## 2021-12-29 DIAGNOSIS — E1122 Type 2 diabetes mellitus with diabetic chronic kidney disease: Secondary | ICD-10-CM | POA: Diagnosis not present

## 2021-12-29 DIAGNOSIS — I129 Hypertensive chronic kidney disease with stage 1 through stage 4 chronic kidney disease, or unspecified chronic kidney disease: Secondary | ICD-10-CM | POA: Diagnosis not present

## 2022-01-02 DIAGNOSIS — M79674 Pain in right toe(s): Secondary | ICD-10-CM | POA: Diagnosis not present

## 2022-01-02 DIAGNOSIS — R269 Unspecified abnormalities of gait and mobility: Secondary | ICD-10-CM | POA: Diagnosis not present

## 2022-01-02 DIAGNOSIS — E1142 Type 2 diabetes mellitus with diabetic polyneuropathy: Secondary | ICD-10-CM | POA: Diagnosis not present

## 2022-01-02 DIAGNOSIS — B351 Tinea unguium: Secondary | ICD-10-CM | POA: Diagnosis not present

## 2022-01-02 DIAGNOSIS — M79675 Pain in left toe(s): Secondary | ICD-10-CM | POA: Diagnosis not present

## 2022-01-02 DIAGNOSIS — L605 Yellow nail syndrome: Secondary | ICD-10-CM | POA: Diagnosis not present

## 2022-01-02 DIAGNOSIS — L851 Acquired keratosis [keratoderma] palmaris et plantaris: Secondary | ICD-10-CM | POA: Diagnosis not present

## 2022-01-03 DIAGNOSIS — N189 Chronic kidney disease, unspecified: Secondary | ICD-10-CM | POA: Diagnosis not present

## 2022-01-03 DIAGNOSIS — D631 Anemia in chronic kidney disease: Secondary | ICD-10-CM | POA: Diagnosis not present

## 2022-01-03 DIAGNOSIS — M109 Gout, unspecified: Secondary | ICD-10-CM | POA: Diagnosis not present

## 2022-01-03 DIAGNOSIS — F32A Depression, unspecified: Secondary | ICD-10-CM | POA: Diagnosis not present

## 2022-01-03 DIAGNOSIS — I129 Hypertensive chronic kidney disease with stage 1 through stage 4 chronic kidney disease, or unspecified chronic kidney disease: Secondary | ICD-10-CM | POA: Diagnosis not present

## 2022-01-03 DIAGNOSIS — E1122 Type 2 diabetes mellitus with diabetic chronic kidney disease: Secondary | ICD-10-CM | POA: Diagnosis not present

## 2022-01-05 DIAGNOSIS — I129 Hypertensive chronic kidney disease with stage 1 through stage 4 chronic kidney disease, or unspecified chronic kidney disease: Secondary | ICD-10-CM | POA: Diagnosis not present

## 2022-01-05 DIAGNOSIS — D631 Anemia in chronic kidney disease: Secondary | ICD-10-CM | POA: Diagnosis not present

## 2022-01-05 DIAGNOSIS — E1122 Type 2 diabetes mellitus with diabetic chronic kidney disease: Secondary | ICD-10-CM | POA: Diagnosis not present

## 2022-01-05 DIAGNOSIS — N189 Chronic kidney disease, unspecified: Secondary | ICD-10-CM | POA: Diagnosis not present

## 2022-01-05 DIAGNOSIS — F32A Depression, unspecified: Secondary | ICD-10-CM | POA: Diagnosis not present

## 2022-01-05 DIAGNOSIS — M109 Gout, unspecified: Secondary | ICD-10-CM | POA: Diagnosis not present

## 2022-01-09 DIAGNOSIS — D631 Anemia in chronic kidney disease: Secondary | ICD-10-CM | POA: Diagnosis not present

## 2022-01-09 DIAGNOSIS — E1122 Type 2 diabetes mellitus with diabetic chronic kidney disease: Secondary | ICD-10-CM | POA: Diagnosis not present

## 2022-01-09 DIAGNOSIS — F32A Depression, unspecified: Secondary | ICD-10-CM | POA: Diagnosis not present

## 2022-01-09 DIAGNOSIS — M109 Gout, unspecified: Secondary | ICD-10-CM | POA: Diagnosis not present

## 2022-01-09 DIAGNOSIS — N189 Chronic kidney disease, unspecified: Secondary | ICD-10-CM | POA: Diagnosis not present

## 2022-01-09 DIAGNOSIS — I129 Hypertensive chronic kidney disease with stage 1 through stage 4 chronic kidney disease, or unspecified chronic kidney disease: Secondary | ICD-10-CM | POA: Diagnosis not present

## 2022-01-13 DIAGNOSIS — I129 Hypertensive chronic kidney disease with stage 1 through stage 4 chronic kidney disease, or unspecified chronic kidney disease: Secondary | ICD-10-CM | POA: Diagnosis not present

## 2022-01-13 DIAGNOSIS — M109 Gout, unspecified: Secondary | ICD-10-CM | POA: Diagnosis not present

## 2022-01-13 DIAGNOSIS — E1122 Type 2 diabetes mellitus with diabetic chronic kidney disease: Secondary | ICD-10-CM | POA: Diagnosis not present

## 2022-01-13 DIAGNOSIS — N189 Chronic kidney disease, unspecified: Secondary | ICD-10-CM | POA: Diagnosis not present

## 2022-01-13 DIAGNOSIS — F32A Depression, unspecified: Secondary | ICD-10-CM | POA: Diagnosis not present

## 2022-01-13 DIAGNOSIS — D631 Anemia in chronic kidney disease: Secondary | ICD-10-CM | POA: Diagnosis not present

## 2022-01-16 DIAGNOSIS — E1122 Type 2 diabetes mellitus with diabetic chronic kidney disease: Secondary | ICD-10-CM | POA: Diagnosis not present

## 2022-01-16 DIAGNOSIS — M5459 Other low back pain: Secondary | ICD-10-CM | POA: Diagnosis not present

## 2022-01-16 DIAGNOSIS — N182 Chronic kidney disease, stage 2 (mild): Secondary | ICD-10-CM | POA: Diagnosis not present

## 2022-01-16 DIAGNOSIS — N189 Chronic kidney disease, unspecified: Secondary | ICD-10-CM | POA: Diagnosis not present

## 2022-01-16 DIAGNOSIS — I129 Hypertensive chronic kidney disease with stage 1 through stage 4 chronic kidney disease, or unspecified chronic kidney disease: Secondary | ICD-10-CM | POA: Diagnosis not present

## 2022-01-18 DIAGNOSIS — M109 Gout, unspecified: Secondary | ICD-10-CM | POA: Diagnosis not present

## 2022-01-18 DIAGNOSIS — E1122 Type 2 diabetes mellitus with diabetic chronic kidney disease: Secondary | ICD-10-CM | POA: Diagnosis not present

## 2022-01-18 DIAGNOSIS — L98499 Non-pressure chronic ulcer of skin of other sites with unspecified severity: Secondary | ICD-10-CM | POA: Diagnosis not present

## 2022-01-18 DIAGNOSIS — I129 Hypertensive chronic kidney disease with stage 1 through stage 4 chronic kidney disease, or unspecified chronic kidney disease: Secondary | ICD-10-CM | POA: Diagnosis not present

## 2022-01-18 DIAGNOSIS — F32A Depression, unspecified: Secondary | ICD-10-CM | POA: Diagnosis not present

## 2022-01-18 DIAGNOSIS — D631 Anemia in chronic kidney disease: Secondary | ICD-10-CM | POA: Diagnosis not present

## 2022-01-18 DIAGNOSIS — N189 Chronic kidney disease, unspecified: Secondary | ICD-10-CM | POA: Diagnosis not present

## 2022-01-25 DIAGNOSIS — M109 Gout, unspecified: Secondary | ICD-10-CM | POA: Diagnosis not present

## 2022-01-25 DIAGNOSIS — E1122 Type 2 diabetes mellitus with diabetic chronic kidney disease: Secondary | ICD-10-CM | POA: Diagnosis not present

## 2022-01-25 DIAGNOSIS — D631 Anemia in chronic kidney disease: Secondary | ICD-10-CM | POA: Diagnosis not present

## 2022-01-25 DIAGNOSIS — I129 Hypertensive chronic kidney disease with stage 1 through stage 4 chronic kidney disease, or unspecified chronic kidney disease: Secondary | ICD-10-CM | POA: Diagnosis not present

## 2022-01-25 DIAGNOSIS — N189 Chronic kidney disease, unspecified: Secondary | ICD-10-CM | POA: Diagnosis not present

## 2022-01-25 DIAGNOSIS — F32A Depression, unspecified: Secondary | ICD-10-CM | POA: Diagnosis not present

## 2022-02-06 DIAGNOSIS — R600 Localized edema: Secondary | ICD-10-CM | POA: Diagnosis not present

## 2022-02-06 DIAGNOSIS — K5901 Slow transit constipation: Secondary | ICD-10-CM | POA: Diagnosis not present

## 2022-02-06 DIAGNOSIS — I129 Hypertensive chronic kidney disease with stage 1 through stage 4 chronic kidney disease, or unspecified chronic kidney disease: Secondary | ICD-10-CM | POA: Diagnosis not present

## 2022-02-06 DIAGNOSIS — N182 Chronic kidney disease, stage 2 (mild): Secondary | ICD-10-CM | POA: Diagnosis not present

## 2022-02-06 DIAGNOSIS — E1122 Type 2 diabetes mellitus with diabetic chronic kidney disease: Secondary | ICD-10-CM | POA: Diagnosis not present

## 2022-03-20 DIAGNOSIS — K5901 Slow transit constipation: Secondary | ICD-10-CM | POA: Diagnosis not present

## 2022-03-20 DIAGNOSIS — E782 Mixed hyperlipidemia: Secondary | ICD-10-CM | POA: Diagnosis not present

## 2022-03-20 DIAGNOSIS — N182 Chronic kidney disease, stage 2 (mild): Secondary | ICD-10-CM | POA: Diagnosis not present

## 2022-03-20 DIAGNOSIS — I129 Hypertensive chronic kidney disease with stage 1 through stage 4 chronic kidney disease, or unspecified chronic kidney disease: Secondary | ICD-10-CM | POA: Diagnosis not present

## 2022-03-20 DIAGNOSIS — M1A9XX0 Chronic gout, unspecified, without tophus (tophi): Secondary | ICD-10-CM | POA: Diagnosis not present

## 2022-03-20 DIAGNOSIS — E1122 Type 2 diabetes mellitus with diabetic chronic kidney disease: Secondary | ICD-10-CM | POA: Diagnosis not present

## 2022-03-22 DIAGNOSIS — E039 Hypothyroidism, unspecified: Secondary | ICD-10-CM | POA: Diagnosis not present

## 2022-03-22 DIAGNOSIS — E559 Vitamin D deficiency, unspecified: Secondary | ICD-10-CM | POA: Diagnosis not present

## 2022-03-22 DIAGNOSIS — E785 Hyperlipidemia, unspecified: Secondary | ICD-10-CM | POA: Diagnosis not present

## 2022-03-22 DIAGNOSIS — Z79899 Other long term (current) drug therapy: Secondary | ICD-10-CM | POA: Diagnosis not present

## 2022-04-14 DIAGNOSIS — B351 Tinea unguium: Secondary | ICD-10-CM | POA: Diagnosis not present

## 2022-04-14 DIAGNOSIS — R601 Generalized edema: Secondary | ICD-10-CM | POA: Diagnosis not present

## 2022-04-14 DIAGNOSIS — R262 Difficulty in walking, not elsewhere classified: Secondary | ICD-10-CM | POA: Diagnosis not present

## 2022-04-14 DIAGNOSIS — E119 Type 2 diabetes mellitus without complications: Secondary | ICD-10-CM | POA: Diagnosis not present

## 2022-04-14 DIAGNOSIS — I872 Venous insufficiency (chronic) (peripheral): Secondary | ICD-10-CM | POA: Diagnosis not present

## 2022-04-14 DIAGNOSIS — M6281 Muscle weakness (generalized): Secondary | ICD-10-CM | POA: Diagnosis not present

## 2022-04-14 DIAGNOSIS — L600 Ingrowing nail: Secondary | ICD-10-CM | POA: Diagnosis not present

## 2022-04-14 DIAGNOSIS — R238 Other skin changes: Secondary | ICD-10-CM | POA: Diagnosis not present

## 2022-04-14 DIAGNOSIS — I10 Essential (primary) hypertension: Secondary | ICD-10-CM | POA: Diagnosis not present

## 2022-04-14 DIAGNOSIS — M722 Plantar fascial fibromatosis: Secondary | ICD-10-CM | POA: Diagnosis not present

## 2022-04-14 DIAGNOSIS — I739 Peripheral vascular disease, unspecified: Secondary | ICD-10-CM | POA: Diagnosis not present

## 2022-04-17 DIAGNOSIS — E782 Mixed hyperlipidemia: Secondary | ICD-10-CM | POA: Diagnosis not present

## 2022-04-17 DIAGNOSIS — N182 Chronic kidney disease, stage 2 (mild): Secondary | ICD-10-CM | POA: Diagnosis not present

## 2022-04-17 DIAGNOSIS — W19XXXA Unspecified fall, initial encounter: Secondary | ICD-10-CM | POA: Diagnosis not present

## 2022-04-17 DIAGNOSIS — I129 Hypertensive chronic kidney disease with stage 1 through stage 4 chronic kidney disease, or unspecified chronic kidney disease: Secondary | ICD-10-CM | POA: Diagnosis not present

## 2022-04-17 DIAGNOSIS — K5901 Slow transit constipation: Secondary | ICD-10-CM | POA: Diagnosis not present

## 2022-04-17 DIAGNOSIS — E1122 Type 2 diabetes mellitus with diabetic chronic kidney disease: Secondary | ICD-10-CM | POA: Diagnosis not present

## 2022-04-20 DIAGNOSIS — K5901 Slow transit constipation: Secondary | ICD-10-CM | POA: Diagnosis not present

## 2022-04-20 DIAGNOSIS — I10 Essential (primary) hypertension: Secondary | ICD-10-CM | POA: Diagnosis not present

## 2022-04-20 DIAGNOSIS — E782 Mixed hyperlipidemia: Secondary | ICD-10-CM | POA: Diagnosis not present

## 2022-04-20 DIAGNOSIS — M5459 Other low back pain: Secondary | ICD-10-CM | POA: Diagnosis not present

## 2022-04-20 DIAGNOSIS — E560 Deficiency of vitamin E: Secondary | ICD-10-CM | POA: Diagnosis not present

## 2022-04-20 DIAGNOSIS — R600 Localized edema: Secondary | ICD-10-CM | POA: Diagnosis not present

## 2022-05-15 DIAGNOSIS — E1122 Type 2 diabetes mellitus with diabetic chronic kidney disease: Secondary | ICD-10-CM | POA: Diagnosis not present

## 2022-05-15 DIAGNOSIS — K5903 Drug induced constipation: Secondary | ICD-10-CM | POA: Diagnosis not present

## 2022-05-15 DIAGNOSIS — N182 Chronic kidney disease, stage 2 (mild): Secondary | ICD-10-CM | POA: Diagnosis not present

## 2022-05-15 DIAGNOSIS — E782 Mixed hyperlipidemia: Secondary | ICD-10-CM | POA: Diagnosis not present

## 2022-05-15 DIAGNOSIS — M1A9XX0 Chronic gout, unspecified, without tophus (tophi): Secondary | ICD-10-CM | POA: Diagnosis not present

## 2022-05-15 DIAGNOSIS — I129 Hypertensive chronic kidney disease with stage 1 through stage 4 chronic kidney disease, or unspecified chronic kidney disease: Secondary | ICD-10-CM | POA: Diagnosis not present

## 2022-05-31 DIAGNOSIS — M109 Gout, unspecified: Secondary | ICD-10-CM | POA: Diagnosis not present

## 2022-05-31 DIAGNOSIS — I10 Essential (primary) hypertension: Secondary | ICD-10-CM | POA: Diagnosis not present

## 2022-05-31 DIAGNOSIS — R296 Repeated falls: Secondary | ICD-10-CM | POA: Diagnosis not present

## 2022-05-31 DIAGNOSIS — M545 Low back pain, unspecified: Secondary | ICD-10-CM | POA: Diagnosis not present

## 2022-06-01 DIAGNOSIS — M6281 Muscle weakness (generalized): Secondary | ICD-10-CM | POA: Diagnosis not present

## 2022-06-01 DIAGNOSIS — E569 Vitamin deficiency, unspecified: Secondary | ICD-10-CM | POA: Diagnosis not present

## 2022-06-01 DIAGNOSIS — Z8616 Personal history of COVID-19: Secondary | ICD-10-CM | POA: Diagnosis not present

## 2022-06-01 DIAGNOSIS — M109 Gout, unspecified: Secondary | ICD-10-CM | POA: Diagnosis not present

## 2022-06-01 DIAGNOSIS — E119 Type 2 diabetes mellitus without complications: Secondary | ICD-10-CM | POA: Diagnosis not present

## 2022-06-01 DIAGNOSIS — D649 Anemia, unspecified: Secondary | ICD-10-CM | POA: Diagnosis not present

## 2022-06-02 DIAGNOSIS — M109 Gout, unspecified: Secondary | ICD-10-CM | POA: Diagnosis not present

## 2022-06-02 DIAGNOSIS — R296 Repeated falls: Secondary | ICD-10-CM | POA: Diagnosis not present

## 2022-06-02 DIAGNOSIS — M545 Low back pain, unspecified: Secondary | ICD-10-CM | POA: Diagnosis not present

## 2022-06-02 DIAGNOSIS — Z8616 Personal history of COVID-19: Secondary | ICD-10-CM | POA: Diagnosis not present

## 2022-06-02 DIAGNOSIS — I10 Essential (primary) hypertension: Secondary | ICD-10-CM | POA: Diagnosis not present

## 2022-06-02 DIAGNOSIS — M6281 Muscle weakness (generalized): Secondary | ICD-10-CM | POA: Diagnosis not present

## 2022-06-03 DIAGNOSIS — Z8616 Personal history of COVID-19: Secondary | ICD-10-CM | POA: Diagnosis not present

## 2022-06-03 DIAGNOSIS — M6281 Muscle weakness (generalized): Secondary | ICD-10-CM | POA: Diagnosis not present

## 2022-06-03 DIAGNOSIS — M109 Gout, unspecified: Secondary | ICD-10-CM | POA: Diagnosis not present

## 2022-06-05 DIAGNOSIS — Z8616 Personal history of COVID-19: Secondary | ICD-10-CM | POA: Diagnosis not present

## 2022-06-05 DIAGNOSIS — M6281 Muscle weakness (generalized): Secondary | ICD-10-CM | POA: Diagnosis not present

## 2022-06-05 DIAGNOSIS — M109 Gout, unspecified: Secondary | ICD-10-CM | POA: Diagnosis not present

## 2022-06-06 DIAGNOSIS — M79641 Pain in right hand: Secondary | ICD-10-CM | POA: Diagnosis not present

## 2022-06-06 DIAGNOSIS — M79642 Pain in left hand: Secondary | ICD-10-CM | POA: Diagnosis not present

## 2022-06-06 DIAGNOSIS — E785 Hyperlipidemia, unspecified: Secondary | ICD-10-CM | POA: Diagnosis not present

## 2022-06-06 DIAGNOSIS — M6281 Muscle weakness (generalized): Secondary | ICD-10-CM | POA: Diagnosis not present

## 2022-06-06 DIAGNOSIS — M109 Gout, unspecified: Secondary | ICD-10-CM | POA: Diagnosis not present

## 2022-06-06 DIAGNOSIS — E1159 Type 2 diabetes mellitus with other circulatory complications: Secondary | ICD-10-CM | POA: Diagnosis not present

## 2022-06-06 DIAGNOSIS — Z8616 Personal history of COVID-19: Secondary | ICD-10-CM | POA: Diagnosis not present

## 2022-06-07 DIAGNOSIS — I10 Essential (primary) hypertension: Secondary | ICD-10-CM | POA: Diagnosis not present

## 2022-06-07 DIAGNOSIS — Z79899 Other long term (current) drug therapy: Secondary | ICD-10-CM | POA: Diagnosis not present

## 2022-06-07 DIAGNOSIS — M109 Gout, unspecified: Secondary | ICD-10-CM | POA: Diagnosis not present

## 2022-06-07 DIAGNOSIS — Z8616 Personal history of COVID-19: Secondary | ICD-10-CM | POA: Diagnosis not present

## 2022-06-07 DIAGNOSIS — M6281 Muscle weakness (generalized): Secondary | ICD-10-CM | POA: Diagnosis not present

## 2022-06-07 DIAGNOSIS — E559 Vitamin D deficiency, unspecified: Secondary | ICD-10-CM | POA: Diagnosis not present

## 2022-06-08 DIAGNOSIS — M6281 Muscle weakness (generalized): Secondary | ICD-10-CM | POA: Diagnosis not present

## 2022-06-08 DIAGNOSIS — M109 Gout, unspecified: Secondary | ICD-10-CM | POA: Diagnosis not present

## 2022-06-08 DIAGNOSIS — E1159 Type 2 diabetes mellitus with other circulatory complications: Secondary | ICD-10-CM | POA: Diagnosis not present

## 2022-06-08 DIAGNOSIS — G3184 Mild cognitive impairment, so stated: Secondary | ICD-10-CM | POA: Diagnosis not present

## 2022-06-08 DIAGNOSIS — Z8616 Personal history of COVID-19: Secondary | ICD-10-CM | POA: Diagnosis not present

## 2022-06-08 DIAGNOSIS — I4891 Unspecified atrial fibrillation: Secondary | ICD-10-CM | POA: Diagnosis not present

## 2022-06-09 DIAGNOSIS — M6281 Muscle weakness (generalized): Secondary | ICD-10-CM | POA: Diagnosis not present

## 2022-06-09 DIAGNOSIS — Z8616 Personal history of COVID-19: Secondary | ICD-10-CM | POA: Diagnosis not present

## 2022-06-09 DIAGNOSIS — E559 Vitamin D deficiency, unspecified: Secondary | ICD-10-CM | POA: Diagnosis not present

## 2022-06-09 DIAGNOSIS — M109 Gout, unspecified: Secondary | ICD-10-CM | POA: Diagnosis not present

## 2022-06-12 DIAGNOSIS — M6281 Muscle weakness (generalized): Secondary | ICD-10-CM | POA: Diagnosis not present

## 2022-06-12 DIAGNOSIS — M109 Gout, unspecified: Secondary | ICD-10-CM | POA: Diagnosis not present

## 2022-06-12 DIAGNOSIS — Z8616 Personal history of COVID-19: Secondary | ICD-10-CM | POA: Diagnosis not present

## 2022-06-13 DIAGNOSIS — Z8616 Personal history of COVID-19: Secondary | ICD-10-CM | POA: Diagnosis not present

## 2022-06-13 DIAGNOSIS — M109 Gout, unspecified: Secondary | ICD-10-CM | POA: Diagnosis not present

## 2022-06-13 DIAGNOSIS — M6281 Muscle weakness (generalized): Secondary | ICD-10-CM | POA: Diagnosis not present

## 2022-06-14 DIAGNOSIS — M6281 Muscle weakness (generalized): Secondary | ICD-10-CM | POA: Diagnosis not present

## 2022-06-14 DIAGNOSIS — M109 Gout, unspecified: Secondary | ICD-10-CM | POA: Diagnosis not present

## 2022-06-14 DIAGNOSIS — Z8616 Personal history of COVID-19: Secondary | ICD-10-CM | POA: Diagnosis not present

## 2022-06-20 DIAGNOSIS — Z23 Encounter for immunization: Secondary | ICD-10-CM | POA: Diagnosis not present

## 2022-06-20 DIAGNOSIS — M109 Gout, unspecified: Secondary | ICD-10-CM | POA: Diagnosis not present

## 2022-06-20 DIAGNOSIS — Z8616 Personal history of COVID-19: Secondary | ICD-10-CM | POA: Diagnosis not present

## 2022-06-20 DIAGNOSIS — M6281 Muscle weakness (generalized): Secondary | ICD-10-CM | POA: Diagnosis not present

## 2022-06-26 DIAGNOSIS — H109 Unspecified conjunctivitis: Secondary | ICD-10-CM | POA: Diagnosis not present

## 2022-06-26 DIAGNOSIS — E46 Unspecified protein-calorie malnutrition: Secondary | ICD-10-CM | POA: Diagnosis not present

## 2022-07-02 DIAGNOSIS — M6281 Muscle weakness (generalized): Secondary | ICD-10-CM | POA: Diagnosis not present

## 2022-07-02 DIAGNOSIS — I129 Hypertensive chronic kidney disease with stage 1 through stage 4 chronic kidney disease, or unspecified chronic kidney disease: Secondary | ICD-10-CM | POA: Diagnosis not present

## 2022-07-02 DIAGNOSIS — Z23 Encounter for immunization: Secondary | ICD-10-CM | POA: Diagnosis not present

## 2022-07-04 DIAGNOSIS — Z23 Encounter for immunization: Secondary | ICD-10-CM | POA: Diagnosis not present

## 2022-07-04 DIAGNOSIS — I129 Hypertensive chronic kidney disease with stage 1 through stage 4 chronic kidney disease, or unspecified chronic kidney disease: Secondary | ICD-10-CM | POA: Diagnosis not present

## 2022-07-04 DIAGNOSIS — M6281 Muscle weakness (generalized): Secondary | ICD-10-CM | POA: Diagnosis not present

## 2022-07-05 DIAGNOSIS — Z23 Encounter for immunization: Secondary | ICD-10-CM | POA: Diagnosis not present

## 2022-07-05 DIAGNOSIS — M6281 Muscle weakness (generalized): Secondary | ICD-10-CM | POA: Diagnosis not present

## 2022-07-05 DIAGNOSIS — I129 Hypertensive chronic kidney disease with stage 1 through stage 4 chronic kidney disease, or unspecified chronic kidney disease: Secondary | ICD-10-CM | POA: Diagnosis not present

## 2022-07-06 DIAGNOSIS — I129 Hypertensive chronic kidney disease with stage 1 through stage 4 chronic kidney disease, or unspecified chronic kidney disease: Secondary | ICD-10-CM | POA: Diagnosis not present

## 2022-07-06 DIAGNOSIS — Z23 Encounter for immunization: Secondary | ICD-10-CM | POA: Diagnosis not present

## 2022-07-06 DIAGNOSIS — M6281 Muscle weakness (generalized): Secondary | ICD-10-CM | POA: Diagnosis not present

## 2022-07-08 DIAGNOSIS — M6281 Muscle weakness (generalized): Secondary | ICD-10-CM | POA: Diagnosis not present

## 2022-07-08 DIAGNOSIS — I129 Hypertensive chronic kidney disease with stage 1 through stage 4 chronic kidney disease, or unspecified chronic kidney disease: Secondary | ICD-10-CM | POA: Diagnosis not present

## 2022-07-08 DIAGNOSIS — Z23 Encounter for immunization: Secondary | ICD-10-CM | POA: Diagnosis not present

## 2022-07-12 DIAGNOSIS — Z23 Encounter for immunization: Secondary | ICD-10-CM | POA: Diagnosis not present

## 2022-07-12 DIAGNOSIS — M6281 Muscle weakness (generalized): Secondary | ICD-10-CM | POA: Diagnosis not present

## 2022-07-12 DIAGNOSIS — I129 Hypertensive chronic kidney disease with stage 1 through stage 4 chronic kidney disease, or unspecified chronic kidney disease: Secondary | ICD-10-CM | POA: Diagnosis not present

## 2022-07-14 DIAGNOSIS — E119 Type 2 diabetes mellitus without complications: Secondary | ICD-10-CM | POA: Diagnosis not present

## 2022-07-14 DIAGNOSIS — M6281 Muscle weakness (generalized): Secondary | ICD-10-CM | POA: Diagnosis not present

## 2022-07-14 DIAGNOSIS — E569 Vitamin deficiency, unspecified: Secondary | ICD-10-CM | POA: Diagnosis not present

## 2022-07-14 DIAGNOSIS — M25521 Pain in right elbow: Secondary | ICD-10-CM | POA: Diagnosis not present

## 2022-07-14 DIAGNOSIS — D649 Anemia, unspecified: Secondary | ICD-10-CM | POA: Diagnosis not present

## 2022-07-14 DIAGNOSIS — I129 Hypertensive chronic kidney disease with stage 1 through stage 4 chronic kidney disease, or unspecified chronic kidney disease: Secondary | ICD-10-CM | POA: Diagnosis not present

## 2022-07-14 DIAGNOSIS — Z23 Encounter for immunization: Secondary | ICD-10-CM | POA: Diagnosis not present

## 2022-07-14 DIAGNOSIS — M25522 Pain in left elbow: Secondary | ICD-10-CM | POA: Diagnosis not present

## 2022-07-15 DIAGNOSIS — Z23 Encounter for immunization: Secondary | ICD-10-CM | POA: Diagnosis not present

## 2022-07-15 DIAGNOSIS — M6281 Muscle weakness (generalized): Secondary | ICD-10-CM | POA: Diagnosis not present

## 2022-07-15 DIAGNOSIS — I129 Hypertensive chronic kidney disease with stage 1 through stage 4 chronic kidney disease, or unspecified chronic kidney disease: Secondary | ICD-10-CM | POA: Diagnosis not present

## 2022-07-17 DIAGNOSIS — I129 Hypertensive chronic kidney disease with stage 1 through stage 4 chronic kidney disease, or unspecified chronic kidney disease: Secondary | ICD-10-CM | POA: Diagnosis not present

## 2022-07-17 DIAGNOSIS — M6281 Muscle weakness (generalized): Secondary | ICD-10-CM | POA: Diagnosis not present

## 2022-07-17 DIAGNOSIS — Z23 Encounter for immunization: Secondary | ICD-10-CM | POA: Diagnosis not present

## 2022-07-18 DIAGNOSIS — Z23 Encounter for immunization: Secondary | ICD-10-CM | POA: Diagnosis not present

## 2022-07-18 DIAGNOSIS — M6281 Muscle weakness (generalized): Secondary | ICD-10-CM | POA: Diagnosis not present

## 2022-07-18 DIAGNOSIS — I129 Hypertensive chronic kidney disease with stage 1 through stage 4 chronic kidney disease, or unspecified chronic kidney disease: Secondary | ICD-10-CM | POA: Diagnosis not present

## 2022-07-19 DIAGNOSIS — Z23 Encounter for immunization: Secondary | ICD-10-CM | POA: Diagnosis not present

## 2022-07-19 DIAGNOSIS — G3184 Mild cognitive impairment, so stated: Secondary | ICD-10-CM | POA: Diagnosis not present

## 2022-07-19 DIAGNOSIS — M6281 Muscle weakness (generalized): Secondary | ICD-10-CM | POA: Diagnosis not present

## 2022-07-19 DIAGNOSIS — I129 Hypertensive chronic kidney disease with stage 1 through stage 4 chronic kidney disease, or unspecified chronic kidney disease: Secondary | ICD-10-CM | POA: Diagnosis not present

## 2022-07-20 DIAGNOSIS — M6281 Muscle weakness (generalized): Secondary | ICD-10-CM | POA: Diagnosis not present

## 2022-07-20 DIAGNOSIS — Z23 Encounter for immunization: Secondary | ICD-10-CM | POA: Diagnosis not present

## 2022-07-20 DIAGNOSIS — I129 Hypertensive chronic kidney disease with stage 1 through stage 4 chronic kidney disease, or unspecified chronic kidney disease: Secondary | ICD-10-CM | POA: Diagnosis not present

## 2022-07-21 DIAGNOSIS — Z23 Encounter for immunization: Secondary | ICD-10-CM | POA: Diagnosis not present

## 2022-07-21 DIAGNOSIS — I129 Hypertensive chronic kidney disease with stage 1 through stage 4 chronic kidney disease, or unspecified chronic kidney disease: Secondary | ICD-10-CM | POA: Diagnosis not present

## 2022-07-21 DIAGNOSIS — M6281 Muscle weakness (generalized): Secondary | ICD-10-CM | POA: Diagnosis not present

## 2022-07-27 DIAGNOSIS — K5900 Constipation, unspecified: Secondary | ICD-10-CM | POA: Diagnosis not present

## 2022-07-28 DIAGNOSIS — R5383 Other fatigue: Secondary | ICD-10-CM | POA: Diagnosis not present

## 2022-07-28 DIAGNOSIS — R531 Weakness: Secondary | ICD-10-CM | POA: Diagnosis not present

## 2022-08-02 DIAGNOSIS — M109 Gout, unspecified: Secondary | ICD-10-CM | POA: Diagnosis not present

## 2022-08-02 DIAGNOSIS — K5900 Constipation, unspecified: Secondary | ICD-10-CM | POA: Diagnosis not present

## 2022-08-02 DIAGNOSIS — E785 Hyperlipidemia, unspecified: Secondary | ICD-10-CM | POA: Diagnosis not present

## 2022-08-02 DIAGNOSIS — G8929 Other chronic pain: Secondary | ICD-10-CM | POA: Diagnosis not present

## 2022-08-02 DIAGNOSIS — E559 Vitamin D deficiency, unspecified: Secondary | ICD-10-CM | POA: Diagnosis not present

## 2022-08-02 DIAGNOSIS — G4700 Insomnia, unspecified: Secondary | ICD-10-CM | POA: Diagnosis not present

## 2022-08-02 DIAGNOSIS — R5383 Other fatigue: Secondary | ICD-10-CM | POA: Diagnosis not present

## 2022-08-02 DIAGNOSIS — I10 Essential (primary) hypertension: Secondary | ICD-10-CM | POA: Diagnosis not present

## 2022-08-02 DIAGNOSIS — E1159 Type 2 diabetes mellitus with other circulatory complications: Secondary | ICD-10-CM | POA: Diagnosis not present

## 2022-08-02 DIAGNOSIS — F32A Depression, unspecified: Secondary | ICD-10-CM | POA: Diagnosis not present

## 2022-08-04 DIAGNOSIS — R410 Disorientation, unspecified: Secondary | ICD-10-CM | POA: Diagnosis not present

## 2022-08-04 DIAGNOSIS — H1010 Acute atopic conjunctivitis, unspecified eye: Secondary | ICD-10-CM | POA: Diagnosis not present

## 2022-08-04 DIAGNOSIS — R5383 Other fatigue: Secondary | ICD-10-CM | POA: Diagnosis not present

## 2022-08-16 DIAGNOSIS — G3184 Mild cognitive impairment, so stated: Secondary | ICD-10-CM | POA: Diagnosis not present

## 2022-08-16 DIAGNOSIS — G8929 Other chronic pain: Secondary | ICD-10-CM | POA: Diagnosis not present

## 2022-08-16 DIAGNOSIS — G4700 Insomnia, unspecified: Secondary | ICD-10-CM | POA: Diagnosis not present

## 2022-08-26 DIAGNOSIS — R531 Weakness: Secondary | ICD-10-CM | POA: Diagnosis not present

## 2022-08-26 DIAGNOSIS — E785 Hyperlipidemia, unspecified: Secondary | ICD-10-CM | POA: Diagnosis not present

## 2022-08-26 DIAGNOSIS — M79642 Pain in left hand: Secondary | ICD-10-CM | POA: Diagnosis not present

## 2022-08-26 DIAGNOSIS — M79641 Pain in right hand: Secondary | ICD-10-CM | POA: Diagnosis not present

## 2022-08-30 DIAGNOSIS — R519 Headache, unspecified: Secondary | ICD-10-CM | POA: Diagnosis not present

## 2022-08-30 DIAGNOSIS — R531 Weakness: Secondary | ICD-10-CM | POA: Diagnosis not present

## 2022-08-30 DIAGNOSIS — G8929 Other chronic pain: Secondary | ICD-10-CM | POA: Diagnosis not present

## 2022-09-06 DIAGNOSIS — L299 Pruritus, unspecified: Secondary | ICD-10-CM | POA: Diagnosis not present

## 2022-09-06 DIAGNOSIS — L309 Dermatitis, unspecified: Secondary | ICD-10-CM | POA: Diagnosis not present

## 2022-09-10 DIAGNOSIS — M79642 Pain in left hand: Secondary | ICD-10-CM | POA: Diagnosis not present

## 2022-09-10 DIAGNOSIS — M79641 Pain in right hand: Secondary | ICD-10-CM | POA: Diagnosis not present

## 2022-09-10 DIAGNOSIS — M24549 Contracture, unspecified hand: Secondary | ICD-10-CM | POA: Diagnosis not present

## 2022-09-11 DIAGNOSIS — D649 Anemia, unspecified: Secondary | ICD-10-CM | POA: Diagnosis not present

## 2022-09-11 DIAGNOSIS — D72829 Elevated white blood cell count, unspecified: Secondary | ICD-10-CM | POA: Diagnosis not present

## 2022-09-11 DIAGNOSIS — E7800 Pure hypercholesterolemia, unspecified: Secondary | ICD-10-CM | POA: Diagnosis not present

## 2022-09-12 DIAGNOSIS — R700 Elevated erythrocyte sedimentation rate: Secondary | ICD-10-CM | POA: Diagnosis not present

## 2022-09-12 DIAGNOSIS — D72829 Elevated white blood cell count, unspecified: Secondary | ICD-10-CM | POA: Diagnosis not present

## 2022-09-12 DIAGNOSIS — R7989 Other specified abnormal findings of blood chemistry: Secondary | ICD-10-CM | POA: Diagnosis not present

## 2022-09-12 DIAGNOSIS — N182 Chronic kidney disease, stage 2 (mild): Secondary | ICD-10-CM | POA: Diagnosis not present

## 2022-09-12 DIAGNOSIS — D649 Anemia, unspecified: Secondary | ICD-10-CM | POA: Diagnosis not present

## 2022-09-13 DIAGNOSIS — N390 Urinary tract infection, site not specified: Secondary | ICD-10-CM | POA: Diagnosis not present

## 2022-09-13 DIAGNOSIS — R531 Weakness: Secondary | ICD-10-CM | POA: Diagnosis not present

## 2022-09-13 DIAGNOSIS — R52 Pain, unspecified: Secondary | ICD-10-CM | POA: Diagnosis not present

## 2022-09-13 DIAGNOSIS — M24549 Contracture, unspecified hand: Secondary | ICD-10-CM | POA: Diagnosis not present

## 2022-09-13 DIAGNOSIS — Z87440 Personal history of urinary (tract) infections: Secondary | ICD-10-CM | POA: Diagnosis not present

## 2022-09-13 DIAGNOSIS — E860 Dehydration: Secondary | ICD-10-CM | POA: Diagnosis not present

## 2022-09-14 DIAGNOSIS — G8929 Other chronic pain: Secondary | ICD-10-CM | POA: Diagnosis not present

## 2022-09-14 DIAGNOSIS — R531 Weakness: Secondary | ICD-10-CM | POA: Diagnosis not present

## 2022-09-14 DIAGNOSIS — R52 Pain, unspecified: Secondary | ICD-10-CM | POA: Diagnosis not present

## 2022-09-14 DIAGNOSIS — G3184 Mild cognitive impairment, so stated: Secondary | ICD-10-CM | POA: Diagnosis not present

## 2022-09-19 DIAGNOSIS — M79642 Pain in left hand: Secondary | ICD-10-CM | POA: Diagnosis not present

## 2022-09-19 DIAGNOSIS — R768 Other specified abnormal immunological findings in serum: Secondary | ICD-10-CM | POA: Diagnosis not present

## 2022-09-19 DIAGNOSIS — M24549 Contracture, unspecified hand: Secondary | ICD-10-CM | POA: Diagnosis not present

## 2022-09-19 DIAGNOSIS — R700 Elevated erythrocyte sedimentation rate: Secondary | ICD-10-CM | POA: Diagnosis not present

## 2022-09-19 DIAGNOSIS — M79641 Pain in right hand: Secondary | ICD-10-CM | POA: Diagnosis not present

## 2022-09-20 DIAGNOSIS — Z79899 Other long term (current) drug therapy: Secondary | ICD-10-CM | POA: Diagnosis not present

## 2022-09-21 DIAGNOSIS — R768 Other specified abnormal immunological findings in serum: Secondary | ICD-10-CM | POA: Diagnosis not present

## 2022-09-21 DIAGNOSIS — M79642 Pain in left hand: Secondary | ICD-10-CM | POA: Diagnosis not present

## 2022-09-21 DIAGNOSIS — M79641 Pain in right hand: Secondary | ICD-10-CM | POA: Diagnosis not present

## 2022-09-21 DIAGNOSIS — R700 Elevated erythrocyte sedimentation rate: Secondary | ICD-10-CM | POA: Diagnosis not present

## 2022-10-01 DIAGNOSIS — R410 Disorientation, unspecified: Secondary | ICD-10-CM | POA: Diagnosis not present

## 2022-10-01 DIAGNOSIS — M79642 Pain in left hand: Secondary | ICD-10-CM | POA: Diagnosis not present

## 2022-10-01 DIAGNOSIS — M79641 Pain in right hand: Secondary | ICD-10-CM | POA: Diagnosis not present

## 2022-10-01 DIAGNOSIS — G8929 Other chronic pain: Secondary | ICD-10-CM | POA: Diagnosis not present

## 2022-10-01 DIAGNOSIS — Z79899 Other long term (current) drug therapy: Secondary | ICD-10-CM | POA: Diagnosis not present

## 2022-10-05 DIAGNOSIS — G8929 Other chronic pain: Secondary | ICD-10-CM | POA: Diagnosis not present

## 2022-10-05 DIAGNOSIS — M79641 Pain in right hand: Secondary | ICD-10-CM | POA: Diagnosis not present

## 2022-10-05 DIAGNOSIS — M79642 Pain in left hand: Secondary | ICD-10-CM | POA: Diagnosis not present

## 2022-10-10 DIAGNOSIS — R531 Weakness: Secondary | ICD-10-CM | POA: Diagnosis not present

## 2022-10-10 DIAGNOSIS — R768 Other specified abnormal immunological findings in serum: Secondary | ICD-10-CM | POA: Diagnosis not present

## 2022-10-10 DIAGNOSIS — M79641 Pain in right hand: Secondary | ICD-10-CM | POA: Diagnosis not present

## 2022-10-10 DIAGNOSIS — G8929 Other chronic pain: Secondary | ICD-10-CM | POA: Diagnosis not present

## 2022-10-10 DIAGNOSIS — M79642 Pain in left hand: Secondary | ICD-10-CM | POA: Diagnosis not present

## 2022-10-12 DIAGNOSIS — G3184 Mild cognitive impairment, so stated: Secondary | ICD-10-CM | POA: Diagnosis not present

## 2022-10-15 DIAGNOSIS — G8929 Other chronic pain: Secondary | ICD-10-CM | POA: Diagnosis not present

## 2022-10-17 DIAGNOSIS — M24541 Contracture, right hand: Secondary | ICD-10-CM | POA: Diagnosis not present

## 2022-10-17 DIAGNOSIS — M24542 Contracture, left hand: Secondary | ICD-10-CM | POA: Diagnosis not present

## 2022-10-17 DIAGNOSIS — M150 Primary generalized (osteo)arthritis: Secondary | ICD-10-CM | POA: Diagnosis not present

## 2022-10-19 DIAGNOSIS — M150 Primary generalized (osteo)arthritis: Secondary | ICD-10-CM | POA: Diagnosis not present

## 2022-10-19 DIAGNOSIS — M24542 Contracture, left hand: Secondary | ICD-10-CM | POA: Diagnosis not present

## 2022-10-19 DIAGNOSIS — M24541 Contracture, right hand: Secondary | ICD-10-CM | POA: Diagnosis not present

## 2022-10-20 DIAGNOSIS — H1033 Unspecified acute conjunctivitis, bilateral: Secondary | ICD-10-CM | POA: Diagnosis not present

## 2022-10-20 DIAGNOSIS — M24542 Contracture, left hand: Secondary | ICD-10-CM | POA: Diagnosis not present

## 2022-10-20 DIAGNOSIS — Z20822 Contact with and (suspected) exposure to covid-19: Secondary | ICD-10-CM | POA: Diagnosis not present

## 2022-10-20 DIAGNOSIS — M150 Primary generalized (osteo)arthritis: Secondary | ICD-10-CM | POA: Diagnosis not present

## 2022-10-20 DIAGNOSIS — M24541 Contracture, right hand: Secondary | ICD-10-CM | POA: Diagnosis not present

## 2022-10-21 DIAGNOSIS — M24542 Contracture, left hand: Secondary | ICD-10-CM | POA: Diagnosis not present

## 2022-10-21 DIAGNOSIS — M150 Primary generalized (osteo)arthritis: Secondary | ICD-10-CM | POA: Diagnosis not present

## 2022-10-21 DIAGNOSIS — M24541 Contracture, right hand: Secondary | ICD-10-CM | POA: Diagnosis not present

## 2022-10-22 DIAGNOSIS — M150 Primary generalized (osteo)arthritis: Secondary | ICD-10-CM | POA: Diagnosis not present

## 2022-10-22 DIAGNOSIS — M24541 Contracture, right hand: Secondary | ICD-10-CM | POA: Diagnosis not present

## 2022-10-22 DIAGNOSIS — M24542 Contracture, left hand: Secondary | ICD-10-CM | POA: Diagnosis not present

## 2022-10-24 DIAGNOSIS — R531 Weakness: Secondary | ICD-10-CM | POA: Diagnosis not present

## 2022-10-24 DIAGNOSIS — M24541 Contracture, right hand: Secondary | ICD-10-CM | POA: Diagnosis not present

## 2022-10-24 DIAGNOSIS — H1033 Unspecified acute conjunctivitis, bilateral: Secondary | ICD-10-CM | POA: Diagnosis not present

## 2022-10-24 DIAGNOSIS — M150 Primary generalized (osteo)arthritis: Secondary | ICD-10-CM | POA: Diagnosis not present

## 2022-10-24 DIAGNOSIS — M24542 Contracture, left hand: Secondary | ICD-10-CM | POA: Diagnosis not present

## 2022-10-24 DIAGNOSIS — G8929 Other chronic pain: Secondary | ICD-10-CM | POA: Diagnosis not present

## 2022-10-26 DIAGNOSIS — M24542 Contracture, left hand: Secondary | ICD-10-CM | POA: Diagnosis not present

## 2022-10-26 DIAGNOSIS — M24541 Contracture, right hand: Secondary | ICD-10-CM | POA: Diagnosis not present

## 2022-10-26 DIAGNOSIS — M150 Primary generalized (osteo)arthritis: Secondary | ICD-10-CM | POA: Diagnosis not present

## 2022-10-27 DIAGNOSIS — M150 Primary generalized (osteo)arthritis: Secondary | ICD-10-CM | POA: Diagnosis not present

## 2022-10-27 DIAGNOSIS — M24542 Contracture, left hand: Secondary | ICD-10-CM | POA: Diagnosis not present

## 2022-10-27 DIAGNOSIS — M24541 Contracture, right hand: Secondary | ICD-10-CM | POA: Diagnosis not present

## 2022-10-28 DIAGNOSIS — M24542 Contracture, left hand: Secondary | ICD-10-CM | POA: Diagnosis not present

## 2022-10-28 DIAGNOSIS — M150 Primary generalized (osteo)arthritis: Secondary | ICD-10-CM | POA: Diagnosis not present

## 2022-10-28 DIAGNOSIS — M24541 Contracture, right hand: Secondary | ICD-10-CM | POA: Diagnosis not present

## 2022-10-29 DIAGNOSIS — M150 Primary generalized (osteo)arthritis: Secondary | ICD-10-CM | POA: Diagnosis not present

## 2022-10-29 DIAGNOSIS — M24541 Contracture, right hand: Secondary | ICD-10-CM | POA: Diagnosis not present

## 2022-10-29 DIAGNOSIS — M24542 Contracture, left hand: Secondary | ICD-10-CM | POA: Diagnosis not present

## 2022-10-31 DIAGNOSIS — W19XXXA Unspecified fall, initial encounter: Secondary | ICD-10-CM | POA: Diagnosis not present

## 2022-10-31 DIAGNOSIS — R531 Weakness: Secondary | ICD-10-CM | POA: Diagnosis not present

## 2022-10-31 DIAGNOSIS — M24542 Contracture, left hand: Secondary | ICD-10-CM | POA: Diagnosis not present

## 2022-10-31 DIAGNOSIS — M24541 Contracture, right hand: Secondary | ICD-10-CM | POA: Diagnosis not present

## 2022-10-31 DIAGNOSIS — M150 Primary generalized (osteo)arthritis: Secondary | ICD-10-CM | POA: Diagnosis not present

## 2022-10-31 DIAGNOSIS — S0990XA Unspecified injury of head, initial encounter: Secondary | ICD-10-CM | POA: Diagnosis not present

## 2022-11-01 DIAGNOSIS — E559 Vitamin D deficiency, unspecified: Secondary | ICD-10-CM | POA: Diagnosis not present

## 2022-11-01 DIAGNOSIS — I10 Essential (primary) hypertension: Secondary | ICD-10-CM | POA: Diagnosis not present

## 2022-11-01 DIAGNOSIS — W19XXXD Unspecified fall, subsequent encounter: Secondary | ICD-10-CM | POA: Diagnosis not present

## 2022-11-01 DIAGNOSIS — R531 Weakness: Secondary | ICD-10-CM | POA: Diagnosis not present

## 2022-11-01 DIAGNOSIS — S0990XD Unspecified injury of head, subsequent encounter: Secondary | ICD-10-CM | POA: Diagnosis not present

## 2022-11-02 DIAGNOSIS — M24542 Contracture, left hand: Secondary | ICD-10-CM | POA: Diagnosis not present

## 2022-11-02 DIAGNOSIS — M24541 Contracture, right hand: Secondary | ICD-10-CM | POA: Diagnosis not present

## 2022-11-02 DIAGNOSIS — M150 Primary generalized (osteo)arthritis: Secondary | ICD-10-CM | POA: Diagnosis not present

## 2022-11-03 DIAGNOSIS — M150 Primary generalized (osteo)arthritis: Secondary | ICD-10-CM | POA: Diagnosis not present

## 2022-11-03 DIAGNOSIS — Z20822 Contact with and (suspected) exposure to covid-19: Secondary | ICD-10-CM | POA: Diagnosis not present

## 2022-11-03 DIAGNOSIS — H1033 Unspecified acute conjunctivitis, bilateral: Secondary | ICD-10-CM | POA: Diagnosis not present

## 2022-11-03 DIAGNOSIS — M24541 Contracture, right hand: Secondary | ICD-10-CM | POA: Diagnosis not present

## 2022-11-03 DIAGNOSIS — M24542 Contracture, left hand: Secondary | ICD-10-CM | POA: Diagnosis not present

## 2022-11-04 DIAGNOSIS — M24542 Contracture, left hand: Secondary | ICD-10-CM | POA: Diagnosis not present

## 2022-11-04 DIAGNOSIS — M150 Primary generalized (osteo)arthritis: Secondary | ICD-10-CM | POA: Diagnosis not present

## 2022-11-04 DIAGNOSIS — M24541 Contracture, right hand: Secondary | ICD-10-CM | POA: Diagnosis not present

## 2022-11-05 DIAGNOSIS — M24542 Contracture, left hand: Secondary | ICD-10-CM | POA: Diagnosis not present

## 2022-11-05 DIAGNOSIS — M150 Primary generalized (osteo)arthritis: Secondary | ICD-10-CM | POA: Diagnosis not present

## 2022-11-05 DIAGNOSIS — M24541 Contracture, right hand: Secondary | ICD-10-CM | POA: Diagnosis not present

## 2022-11-06 DIAGNOSIS — M24541 Contracture, right hand: Secondary | ICD-10-CM | POA: Diagnosis not present

## 2022-11-06 DIAGNOSIS — M150 Primary generalized (osteo)arthritis: Secondary | ICD-10-CM | POA: Diagnosis not present

## 2022-11-06 DIAGNOSIS — M24542 Contracture, left hand: Secondary | ICD-10-CM | POA: Diagnosis not present

## 2022-11-07 DIAGNOSIS — M24541 Contracture, right hand: Secondary | ICD-10-CM | POA: Diagnosis not present

## 2022-11-07 DIAGNOSIS — M24542 Contracture, left hand: Secondary | ICD-10-CM | POA: Diagnosis not present

## 2022-11-07 DIAGNOSIS — M150 Primary generalized (osteo)arthritis: Secondary | ICD-10-CM | POA: Diagnosis not present

## 2022-11-09 DIAGNOSIS — G3184 Mild cognitive impairment, so stated: Secondary | ICD-10-CM | POA: Diagnosis not present

## 2022-11-10 DIAGNOSIS — M24541 Contracture, right hand: Secondary | ICD-10-CM | POA: Diagnosis not present

## 2022-11-10 DIAGNOSIS — M150 Primary generalized (osteo)arthritis: Secondary | ICD-10-CM | POA: Diagnosis not present

## 2022-11-10 DIAGNOSIS — M24542 Contracture, left hand: Secondary | ICD-10-CM | POA: Diagnosis not present

## 2022-11-11 DIAGNOSIS — M150 Primary generalized (osteo)arthritis: Secondary | ICD-10-CM | POA: Diagnosis not present

## 2022-11-11 DIAGNOSIS — M24541 Contracture, right hand: Secondary | ICD-10-CM | POA: Diagnosis not present

## 2022-11-11 DIAGNOSIS — M24542 Contracture, left hand: Secondary | ICD-10-CM | POA: Diagnosis not present

## 2022-11-12 DIAGNOSIS — R531 Weakness: Secondary | ICD-10-CM | POA: Diagnosis not present

## 2022-11-12 DIAGNOSIS — M24542 Contracture, left hand: Secondary | ICD-10-CM | POA: Diagnosis not present

## 2022-11-12 DIAGNOSIS — I2510 Atherosclerotic heart disease of native coronary artery without angina pectoris: Secondary | ICD-10-CM | POA: Diagnosis not present

## 2022-11-12 DIAGNOSIS — M24541 Contracture, right hand: Secondary | ICD-10-CM | POA: Diagnosis not present

## 2022-11-12 DIAGNOSIS — H1033 Unspecified acute conjunctivitis, bilateral: Secondary | ICD-10-CM | POA: Diagnosis not present

## 2022-11-12 DIAGNOSIS — G8929 Other chronic pain: Secondary | ICD-10-CM | POA: Diagnosis not present

## 2022-11-12 DIAGNOSIS — I10 Essential (primary) hypertension: Secondary | ICD-10-CM | POA: Diagnosis not present

## 2022-11-12 DIAGNOSIS — M109 Gout, unspecified: Secondary | ICD-10-CM | POA: Diagnosis not present

## 2022-11-12 DIAGNOSIS — K5900 Constipation, unspecified: Secondary | ICD-10-CM | POA: Diagnosis not present

## 2022-11-12 DIAGNOSIS — E1159 Type 2 diabetes mellitus with other circulatory complications: Secondary | ICD-10-CM | POA: Diagnosis not present

## 2022-11-12 DIAGNOSIS — M150 Primary generalized (osteo)arthritis: Secondary | ICD-10-CM | POA: Diagnosis not present

## 2022-11-12 DIAGNOSIS — Z9181 History of falling: Secondary | ICD-10-CM | POA: Diagnosis not present

## 2022-11-13 DIAGNOSIS — M150 Primary generalized (osteo)arthritis: Secondary | ICD-10-CM | POA: Diagnosis not present

## 2022-11-13 DIAGNOSIS — M24542 Contracture, left hand: Secondary | ICD-10-CM | POA: Diagnosis not present

## 2022-11-13 DIAGNOSIS — M24541 Contracture, right hand: Secondary | ICD-10-CM | POA: Diagnosis not present

## 2022-11-18 DIAGNOSIS — R519 Headache, unspecified: Secondary | ICD-10-CM | POA: Diagnosis not present

## 2022-11-18 DIAGNOSIS — G8929 Other chronic pain: Secondary | ICD-10-CM | POA: Diagnosis not present

## 2022-11-21 DIAGNOSIS — Z87440 Personal history of urinary (tract) infections: Secondary | ICD-10-CM | POA: Diagnosis not present

## 2022-11-27 DIAGNOSIS — R52 Pain, unspecified: Secondary | ICD-10-CM | POA: Diagnosis not present

## 2022-11-27 DIAGNOSIS — R5383 Other fatigue: Secondary | ICD-10-CM | POA: Diagnosis not present

## 2022-11-28 DIAGNOSIS — E46 Unspecified protein-calorie malnutrition: Secondary | ICD-10-CM | POA: Diagnosis not present

## 2022-11-29 DIAGNOSIS — E46 Unspecified protein-calorie malnutrition: Secondary | ICD-10-CM | POA: Diagnosis not present

## 2022-11-30 DIAGNOSIS — M109 Gout, unspecified: Secondary | ICD-10-CM | POA: Diagnosis not present

## 2022-11-30 DIAGNOSIS — M150 Primary generalized (osteo)arthritis: Secondary | ICD-10-CM | POA: Diagnosis not present

## 2022-11-30 DIAGNOSIS — M4317 Spondylolisthesis, lumbosacral region: Secondary | ICD-10-CM | POA: Diagnosis not present

## 2022-11-30 DIAGNOSIS — E1122 Type 2 diabetes mellitus with diabetic chronic kidney disease: Secondary | ICD-10-CM | POA: Diagnosis not present

## 2022-12-01 DIAGNOSIS — E46 Unspecified protein-calorie malnutrition: Secondary | ICD-10-CM | POA: Diagnosis not present

## 2022-12-01 DIAGNOSIS — G8929 Other chronic pain: Secondary | ICD-10-CM | POA: Diagnosis not present

## 2022-12-04 DIAGNOSIS — E7800 Pure hypercholesterolemia, unspecified: Secondary | ICD-10-CM | POA: Diagnosis not present

## 2022-12-04 DIAGNOSIS — D649 Anemia, unspecified: Secondary | ICD-10-CM | POA: Diagnosis not present

## 2022-12-04 DIAGNOSIS — E559 Vitamin D deficiency, unspecified: Secondary | ICD-10-CM | POA: Diagnosis not present

## 2022-12-04 DIAGNOSIS — Z79899 Other long term (current) drug therapy: Secondary | ICD-10-CM | POA: Diagnosis not present

## 2022-12-04 DIAGNOSIS — E1122 Type 2 diabetes mellitus with diabetic chronic kidney disease: Secondary | ICD-10-CM | POA: Diagnosis not present

## 2022-12-05 DIAGNOSIS — G8929 Other chronic pain: Secondary | ICD-10-CM | POA: Diagnosis not present

## 2022-12-08 DIAGNOSIS — G8929 Other chronic pain: Secondary | ICD-10-CM | POA: Diagnosis not present

## 2022-12-15 DIAGNOSIS — G8929 Other chronic pain: Secondary | ICD-10-CM | POA: Diagnosis not present

## 2022-12-16 DIAGNOSIS — D649 Anemia, unspecified: Secondary | ICD-10-CM | POA: Diagnosis not present

## 2022-12-16 DIAGNOSIS — E119 Type 2 diabetes mellitus without complications: Secondary | ICD-10-CM | POA: Diagnosis not present

## 2022-12-17 DIAGNOSIS — M79642 Pain in left hand: Secondary | ICD-10-CM | POA: Diagnosis not present

## 2022-12-17 DIAGNOSIS — G8929 Other chronic pain: Secondary | ICD-10-CM | POA: Diagnosis not present

## 2022-12-17 DIAGNOSIS — M79641 Pain in right hand: Secondary | ICD-10-CM | POA: Diagnosis not present

## 2022-12-19 DIAGNOSIS — M109 Gout, unspecified: Secondary | ICD-10-CM | POA: Diagnosis not present

## 2022-12-19 DIAGNOSIS — R768 Other specified abnormal immunological findings in serum: Secondary | ICD-10-CM | POA: Diagnosis not present

## 2022-12-19 DIAGNOSIS — M79642 Pain in left hand: Secondary | ICD-10-CM | POA: Diagnosis not present

## 2022-12-19 DIAGNOSIS — M79641 Pain in right hand: Secondary | ICD-10-CM | POA: Diagnosis not present

## 2022-12-19 DIAGNOSIS — M5489 Other dorsalgia: Secondary | ICD-10-CM | POA: Diagnosis not present

## 2022-12-20 DIAGNOSIS — G8929 Other chronic pain: Secondary | ICD-10-CM | POA: Diagnosis not present

## 2022-12-20 DIAGNOSIS — M79641 Pain in right hand: Secondary | ICD-10-CM | POA: Diagnosis not present

## 2022-12-20 DIAGNOSIS — R531 Weakness: Secondary | ICD-10-CM | POA: Diagnosis not present

## 2022-12-20 DIAGNOSIS — M79642 Pain in left hand: Secondary | ICD-10-CM | POA: Diagnosis not present

## 2022-12-21 DIAGNOSIS — E7800 Pure hypercholesterolemia, unspecified: Secondary | ICD-10-CM | POA: Diagnosis not present

## 2022-12-21 DIAGNOSIS — I10 Essential (primary) hypertension: Secondary | ICD-10-CM | POA: Diagnosis not present

## 2022-12-21 DIAGNOSIS — M069 Rheumatoid arthritis, unspecified: Secondary | ICD-10-CM | POA: Diagnosis not present

## 2022-12-22 DIAGNOSIS — D649 Anemia, unspecified: Secondary | ICD-10-CM | POA: Diagnosis not present

## 2022-12-22 DIAGNOSIS — S81802A Unspecified open wound, left lower leg, initial encounter: Secondary | ICD-10-CM | POA: Diagnosis not present

## 2022-12-22 DIAGNOSIS — R531 Weakness: Secondary | ICD-10-CM | POA: Diagnosis not present

## 2022-12-22 DIAGNOSIS — N182 Chronic kidney disease, stage 2 (mild): Secondary | ICD-10-CM | POA: Diagnosis not present

## 2022-12-22 DIAGNOSIS — M109 Gout, unspecified: Secondary | ICD-10-CM | POA: Diagnosis not present

## 2022-12-22 DIAGNOSIS — G8929 Other chronic pain: Secondary | ICD-10-CM | POA: Diagnosis not present

## 2022-12-23 DIAGNOSIS — M79641 Pain in right hand: Secondary | ICD-10-CM | POA: Diagnosis not present

## 2022-12-24 DIAGNOSIS — M109 Gout, unspecified: Secondary | ICD-10-CM | POA: Diagnosis not present

## 2022-12-24 DIAGNOSIS — G8929 Other chronic pain: Secondary | ICD-10-CM | POA: Diagnosis not present

## 2022-12-24 DIAGNOSIS — R531 Weakness: Secondary | ICD-10-CM | POA: Diagnosis not present

## 2022-12-27 DIAGNOSIS — L03116 Cellulitis of left lower limb: Secondary | ICD-10-CM | POA: Diagnosis not present

## 2022-12-27 DIAGNOSIS — S81802A Unspecified open wound, left lower leg, initial encounter: Secondary | ICD-10-CM | POA: Diagnosis not present

## 2022-12-28 DIAGNOSIS — D649 Anemia, unspecified: Secondary | ICD-10-CM | POA: Diagnosis not present

## 2022-12-28 DIAGNOSIS — E569 Vitamin deficiency, unspecified: Secondary | ICD-10-CM | POA: Diagnosis not present

## 2022-12-28 DIAGNOSIS — E119 Type 2 diabetes mellitus without complications: Secondary | ICD-10-CM | POA: Diagnosis not present

## 2022-12-28 DIAGNOSIS — L97821 Non-pressure chronic ulcer of other part of left lower leg limited to breakdown of skin: Secondary | ICD-10-CM | POA: Diagnosis not present

## 2022-12-28 DIAGNOSIS — Z515 Encounter for palliative care: Secondary | ICD-10-CM | POA: Diagnosis not present

## 2022-12-29 DIAGNOSIS — I739 Peripheral vascular disease, unspecified: Secondary | ICD-10-CM | POA: Diagnosis not present

## 2023-01-01 DIAGNOSIS — L97821 Non-pressure chronic ulcer of other part of left lower leg limited to breakdown of skin: Secondary | ICD-10-CM | POA: Diagnosis not present

## 2023-01-01 DIAGNOSIS — E119 Type 2 diabetes mellitus without complications: Secondary | ICD-10-CM | POA: Diagnosis not present

## 2023-01-01 DIAGNOSIS — D649 Anemia, unspecified: Secondary | ICD-10-CM | POA: Diagnosis not present

## 2023-01-01 DIAGNOSIS — E569 Vitamin deficiency, unspecified: Secondary | ICD-10-CM | POA: Diagnosis not present

## 2023-01-03 DIAGNOSIS — L03116 Cellulitis of left lower limb: Secondary | ICD-10-CM | POA: Diagnosis not present

## 2023-01-03 DIAGNOSIS — I739 Peripheral vascular disease, unspecified: Secondary | ICD-10-CM | POA: Diagnosis not present

## 2023-01-03 DIAGNOSIS — S81802A Unspecified open wound, left lower leg, initial encounter: Secondary | ICD-10-CM | POA: Diagnosis not present

## 2023-01-07 DIAGNOSIS — G8929 Other chronic pain: Secondary | ICD-10-CM | POA: Diagnosis not present

## 2023-01-07 DIAGNOSIS — R531 Weakness: Secondary | ICD-10-CM | POA: Diagnosis not present

## 2023-01-08 DIAGNOSIS — E119 Type 2 diabetes mellitus without complications: Secondary | ICD-10-CM | POA: Diagnosis not present

## 2023-01-08 DIAGNOSIS — E569 Vitamin deficiency, unspecified: Secondary | ICD-10-CM | POA: Diagnosis not present

## 2023-01-08 DIAGNOSIS — D649 Anemia, unspecified: Secondary | ICD-10-CM | POA: Diagnosis not present

## 2023-01-08 DIAGNOSIS — L97821 Non-pressure chronic ulcer of other part of left lower leg limited to breakdown of skin: Secondary | ICD-10-CM | POA: Diagnosis not present

## 2023-01-09 DIAGNOSIS — Z515 Encounter for palliative care: Secondary | ICD-10-CM | POA: Diagnosis not present

## 2023-01-15 DIAGNOSIS — E569 Vitamin deficiency, unspecified: Secondary | ICD-10-CM | POA: Diagnosis not present

## 2023-01-15 DIAGNOSIS — L97821 Non-pressure chronic ulcer of other part of left lower leg limited to breakdown of skin: Secondary | ICD-10-CM | POA: Diagnosis not present

## 2023-01-15 DIAGNOSIS — E119 Type 2 diabetes mellitus without complications: Secondary | ICD-10-CM | POA: Diagnosis not present

## 2023-01-15 DIAGNOSIS — D649 Anemia, unspecified: Secondary | ICD-10-CM | POA: Diagnosis not present

## 2023-02-04 DIAGNOSIS — K5900 Constipation, unspecified: Secondary | ICD-10-CM | POA: Diagnosis not present

## 2023-02-04 DIAGNOSIS — M109 Gout, unspecified: Secondary | ICD-10-CM | POA: Diagnosis not present

## 2023-02-05 DIAGNOSIS — M150 Primary generalized (osteo)arthritis: Secondary | ICD-10-CM | POA: Diagnosis not present

## 2023-02-05 DIAGNOSIS — M6281 Muscle weakness (generalized): Secondary | ICD-10-CM | POA: Diagnosis not present

## 2023-02-06 DIAGNOSIS — M6281 Muscle weakness (generalized): Secondary | ICD-10-CM | POA: Diagnosis not present

## 2023-02-06 DIAGNOSIS — M150 Primary generalized (osteo)arthritis: Secondary | ICD-10-CM | POA: Diagnosis not present

## 2023-02-07 DIAGNOSIS — M150 Primary generalized (osteo)arthritis: Secondary | ICD-10-CM | POA: Diagnosis not present

## 2023-02-07 DIAGNOSIS — M6281 Muscle weakness (generalized): Secondary | ICD-10-CM | POA: Diagnosis not present

## 2023-02-07 DIAGNOSIS — Z515 Encounter for palliative care: Secondary | ICD-10-CM | POA: Diagnosis not present

## 2023-02-08 DIAGNOSIS — M6281 Muscle weakness (generalized): Secondary | ICD-10-CM | POA: Diagnosis not present

## 2023-02-08 DIAGNOSIS — M150 Primary generalized (osteo)arthritis: Secondary | ICD-10-CM | POA: Diagnosis not present

## 2023-02-09 DIAGNOSIS — M150 Primary generalized (osteo)arthritis: Secondary | ICD-10-CM | POA: Diagnosis not present

## 2023-02-09 DIAGNOSIS — M6281 Muscle weakness (generalized): Secondary | ICD-10-CM | POA: Diagnosis not present

## 2023-02-12 DIAGNOSIS — M6281 Muscle weakness (generalized): Secondary | ICD-10-CM | POA: Diagnosis not present

## 2023-02-12 DIAGNOSIS — M150 Primary generalized (osteo)arthritis: Secondary | ICD-10-CM | POA: Diagnosis not present

## 2023-02-13 DIAGNOSIS — M6281 Muscle weakness (generalized): Secondary | ICD-10-CM | POA: Diagnosis not present

## 2023-02-13 DIAGNOSIS — M150 Primary generalized (osteo)arthritis: Secondary | ICD-10-CM | POA: Diagnosis not present

## 2023-02-15 DIAGNOSIS — M150 Primary generalized (osteo)arthritis: Secondary | ICD-10-CM | POA: Diagnosis not present

## 2023-02-15 DIAGNOSIS — G8929 Other chronic pain: Secondary | ICD-10-CM | POA: Diagnosis not present

## 2023-02-15 DIAGNOSIS — M6281 Muscle weakness (generalized): Secondary | ICD-10-CM | POA: Diagnosis not present

## 2023-02-16 DIAGNOSIS — M150 Primary generalized (osteo)arthritis: Secondary | ICD-10-CM | POA: Diagnosis not present

## 2023-02-16 DIAGNOSIS — M6281 Muscle weakness (generalized): Secondary | ICD-10-CM | POA: Diagnosis not present

## 2023-02-17 DIAGNOSIS — M150 Primary generalized (osteo)arthritis: Secondary | ICD-10-CM | POA: Diagnosis not present

## 2023-02-17 DIAGNOSIS — M6281 Muscle weakness (generalized): Secondary | ICD-10-CM | POA: Diagnosis not present

## 2023-02-18 DIAGNOSIS — D649 Anemia, unspecified: Secondary | ICD-10-CM | POA: Diagnosis not present

## 2023-02-18 DIAGNOSIS — N181 Chronic kidney disease, stage 1: Secondary | ICD-10-CM | POA: Diagnosis not present

## 2023-02-18 DIAGNOSIS — R739 Hyperglycemia, unspecified: Secondary | ICD-10-CM | POA: Diagnosis not present

## 2023-03-13 DIAGNOSIS — L89150 Pressure ulcer of sacral region, unstageable: Secondary | ICD-10-CM | POA: Diagnosis not present

## 2023-03-13 DIAGNOSIS — R531 Weakness: Secondary | ICD-10-CM | POA: Diagnosis not present

## 2023-03-14 DIAGNOSIS — E119 Type 2 diabetes mellitus without complications: Secondary | ICD-10-CM | POA: Diagnosis not present

## 2023-03-14 DIAGNOSIS — E569 Vitamin deficiency, unspecified: Secondary | ICD-10-CM | POA: Diagnosis not present

## 2023-03-14 DIAGNOSIS — L89150 Pressure ulcer of sacral region, unstageable: Secondary | ICD-10-CM | POA: Diagnosis not present

## 2023-03-14 DIAGNOSIS — D649 Anemia, unspecified: Secondary | ICD-10-CM | POA: Diagnosis not present

## 2023-03-15 DIAGNOSIS — G8929 Other chronic pain: Secondary | ICD-10-CM | POA: Diagnosis not present

## 2023-03-20 DIAGNOSIS — R638 Other symptoms and signs concerning food and fluid intake: Secondary | ICD-10-CM | POA: Diagnosis not present

## 2023-03-20 DIAGNOSIS — E569 Vitamin deficiency, unspecified: Secondary | ICD-10-CM | POA: Diagnosis not present

## 2023-03-20 DIAGNOSIS — L89150 Pressure ulcer of sacral region, unstageable: Secondary | ICD-10-CM | POA: Diagnosis not present

## 2023-03-20 DIAGNOSIS — I87312 Chronic venous hypertension (idiopathic) with ulcer of left lower extremity: Secondary | ICD-10-CM | POA: Diagnosis not present

## 2023-03-20 DIAGNOSIS — R627 Adult failure to thrive: Secondary | ICD-10-CM | POA: Diagnosis not present

## 2023-03-20 DIAGNOSIS — R634 Abnormal weight loss: Secondary | ICD-10-CM | POA: Diagnosis not present

## 2023-03-20 DIAGNOSIS — L89153 Pressure ulcer of sacral region, stage 3: Secondary | ICD-10-CM | POA: Diagnosis not present

## 2023-03-20 DIAGNOSIS — E119 Type 2 diabetes mellitus without complications: Secondary | ICD-10-CM | POA: Diagnosis not present

## 2023-03-20 DIAGNOSIS — D649 Anemia, unspecified: Secondary | ICD-10-CM | POA: Diagnosis not present

## 2023-03-22 DIAGNOSIS — Z515 Encounter for palliative care: Secondary | ICD-10-CM | POA: Diagnosis not present

## 2023-03-26 ENCOUNTER — Emergency Department (HOSPITAL_COMMUNITY): Payer: Medicare Other

## 2023-03-26 ENCOUNTER — Encounter (HOSPITAL_COMMUNITY): Payer: Self-pay

## 2023-03-26 ENCOUNTER — Inpatient Hospital Stay (HOSPITAL_COMMUNITY)
Admission: EM | Admit: 2023-03-26 | Discharge: 2023-04-01 | DRG: 871 | Disposition: A | Discharge status: Skilled Nursing Facility | Payer: Medicare Other | Source: Skilled Nursing Facility | Attending: Internal Medicine | Admitting: Internal Medicine

## 2023-03-26 DIAGNOSIS — R739 Hyperglycemia, unspecified: Secondary | ICD-10-CM

## 2023-03-26 DIAGNOSIS — Z8249 Family history of ischemic heart disease and other diseases of the circulatory system: Secondary | ICD-10-CM

## 2023-03-26 DIAGNOSIS — I63541 Cerebral infarction due to unspecified occlusion or stenosis of right cerebellar artery: Secondary | ICD-10-CM | POA: Diagnosis not present

## 2023-03-26 DIAGNOSIS — Z7189 Other specified counseling: Secondary | ICD-10-CM | POA: Diagnosis not present

## 2023-03-26 DIAGNOSIS — D649 Anemia, unspecified: Secondary | ICD-10-CM | POA: Diagnosis not present

## 2023-03-26 DIAGNOSIS — E1151 Type 2 diabetes mellitus with diabetic peripheral angiopathy without gangrene: Secondary | ICD-10-CM | POA: Diagnosis present

## 2023-03-26 DIAGNOSIS — J690 Pneumonitis due to inhalation of food and vomit: Secondary | ICD-10-CM | POA: Diagnosis not present

## 2023-03-26 DIAGNOSIS — E8809 Other disorders of plasma-protein metabolism, not elsewhere classified: Secondary | ICD-10-CM | POA: Diagnosis not present

## 2023-03-26 DIAGNOSIS — I10 Essential (primary) hypertension: Secondary | ICD-10-CM | POA: Diagnosis present

## 2023-03-26 DIAGNOSIS — R4182 Altered mental status, unspecified: Principal | ICD-10-CM

## 2023-03-26 DIAGNOSIS — R6521 Severe sepsis with septic shock: Secondary | ICD-10-CM | POA: Diagnosis not present

## 2023-03-26 DIAGNOSIS — Z7984 Long term (current) use of oral hypoglycemic drugs: Secondary | ICD-10-CM

## 2023-03-26 DIAGNOSIS — R404 Transient alteration of awareness: Secondary | ICD-10-CM | POA: Diagnosis not present

## 2023-03-26 DIAGNOSIS — R4020 Unspecified coma: Secondary | ICD-10-CM | POA: Diagnosis not present

## 2023-03-26 DIAGNOSIS — J9601 Acute respiratory failure with hypoxia: Secondary | ICD-10-CM | POA: Diagnosis not present

## 2023-03-26 DIAGNOSIS — Z66 Do not resuscitate: Secondary | ICD-10-CM | POA: Diagnosis not present

## 2023-03-26 DIAGNOSIS — L8990 Pressure ulcer of unspecified site, unspecified stage: Secondary | ICD-10-CM | POA: Insufficient documentation

## 2023-03-26 DIAGNOSIS — R571 Hypovolemic shock: Secondary | ICD-10-CM | POA: Diagnosis not present

## 2023-03-26 DIAGNOSIS — N179 Acute kidney failure, unspecified: Secondary | ICD-10-CM | POA: Diagnosis present

## 2023-03-26 DIAGNOSIS — E43 Unspecified severe protein-calorie malnutrition: Secondary | ICD-10-CM | POA: Diagnosis present

## 2023-03-26 DIAGNOSIS — L89152 Pressure ulcer of sacral region, stage 2: Secondary | ICD-10-CM | POA: Diagnosis not present

## 2023-03-26 DIAGNOSIS — E861 Hypovolemia: Secondary | ICD-10-CM | POA: Diagnosis not present

## 2023-03-26 DIAGNOSIS — R17 Unspecified jaundice: Secondary | ICD-10-CM | POA: Diagnosis present

## 2023-03-26 DIAGNOSIS — I6782 Cerebral ischemia: Secondary | ICD-10-CM | POA: Diagnosis not present

## 2023-03-26 DIAGNOSIS — Z1152 Encounter for screening for COVID-19: Secondary | ICD-10-CM

## 2023-03-26 DIAGNOSIS — E1165 Type 2 diabetes mellitus with hyperglycemia: Secondary | ICD-10-CM | POA: Diagnosis present

## 2023-03-26 DIAGNOSIS — Z8673 Personal history of transient ischemic attack (TIA), and cerebral infarction without residual deficits: Secondary | ICD-10-CM

## 2023-03-26 DIAGNOSIS — E876 Hypokalemia: Secondary | ICD-10-CM | POA: Diagnosis not present

## 2023-03-26 DIAGNOSIS — Z515 Encounter for palliative care: Secondary | ICD-10-CM | POA: Diagnosis not present

## 2023-03-26 DIAGNOSIS — E878 Other disorders of electrolyte and fluid balance, not elsewhere classified: Secondary | ICD-10-CM | POA: Diagnosis present

## 2023-03-26 DIAGNOSIS — R000 Tachycardia, unspecified: Secondary | ICD-10-CM | POA: Diagnosis not present

## 2023-03-26 DIAGNOSIS — I480 Paroxysmal atrial fibrillation: Secondary | ICD-10-CM | POA: Diagnosis present

## 2023-03-26 DIAGNOSIS — A419 Sepsis, unspecified organism: Secondary | ICD-10-CM | POA: Diagnosis not present

## 2023-03-26 DIAGNOSIS — R0689 Other abnormalities of breathing: Secondary | ICD-10-CM | POA: Diagnosis not present

## 2023-03-26 DIAGNOSIS — Z7982 Long term (current) use of aspirin: Secondary | ICD-10-CM

## 2023-03-26 DIAGNOSIS — R54 Age-related physical debility: Secondary | ICD-10-CM | POA: Diagnosis present

## 2023-03-26 DIAGNOSIS — R64 Cachexia: Secondary | ICD-10-CM

## 2023-03-26 DIAGNOSIS — I499 Cardiac arrhythmia, unspecified: Secondary | ICD-10-CM | POA: Diagnosis not present

## 2023-03-26 DIAGNOSIS — Z743 Need for continuous supervision: Secondary | ICD-10-CM | POA: Diagnosis not present

## 2023-03-26 DIAGNOSIS — I639 Cerebral infarction, unspecified: Secondary | ICD-10-CM | POA: Diagnosis not present

## 2023-03-26 DIAGNOSIS — G9341 Metabolic encephalopathy: Secondary | ICD-10-CM | POA: Diagnosis not present

## 2023-03-26 DIAGNOSIS — E870 Hyperosmolality and hypernatremia: Secondary | ICD-10-CM | POA: Diagnosis present

## 2023-03-26 DIAGNOSIS — E874 Mixed disorder of acid-base balance: Secondary | ICD-10-CM | POA: Diagnosis not present

## 2023-03-26 DIAGNOSIS — Z79899 Other long term (current) drug therapy: Secondary | ICD-10-CM

## 2023-03-26 DIAGNOSIS — R6889 Other general symptoms and signs: Secondary | ICD-10-CM | POA: Diagnosis not present

## 2023-03-26 LAB — URINALYSIS, W/ REFLEX TO CULTURE (INFECTION SUSPECTED)
Bilirubin Urine: NEGATIVE
Glucose, UA: NEGATIVE mg/dL
Ketones, ur: NEGATIVE mg/dL
Leukocytes,Ua: NEGATIVE
Nitrite: NEGATIVE
Protein, ur: 100 mg/dL — AB
Specific Gravity, Urine: 1.019 (ref 1.005–1.030)
pH: 5 (ref 5.0–8.0)

## 2023-03-26 LAB — COMPREHENSIVE METABOLIC PANEL
ALT: 42 U/L (ref 0–44)
AST: 102 U/L — ABNORMAL HIGH (ref 15–41)
Albumin: 3.1 g/dL — ABNORMAL LOW (ref 3.5–5.0)
Alkaline Phosphatase: 70 U/L (ref 38–126)
Anion gap: 18 — ABNORMAL HIGH (ref 5–15)
BUN: 59 mg/dL — ABNORMAL HIGH (ref 8–23)
CO2: 27 mmol/L (ref 22–32)
Calcium: 8.3 mg/dL — ABNORMAL LOW (ref 8.9–10.3)
Chloride: 109 mmol/L (ref 98–111)
Creatinine, Ser: 2.3 mg/dL — ABNORMAL HIGH (ref 0.61–1.24)
GFR, Estimated: 26 mL/min — ABNORMAL LOW (ref >60)
Glucose, Bld: 160 mg/dL — ABNORMAL HIGH (ref 70–99)
Potassium: 4.8 mmol/L (ref 3.5–5.1)
Sodium: 154 mmol/L — ABNORMAL HIGH (ref 135–145)
Total Bilirubin: 1.1 mg/dL (ref 0.0–1.2)
Total Protein: 7.3 g/dL (ref 6.5–8.1)

## 2023-03-26 LAB — RESP PANEL BY RT-PCR (RSV, FLU A&B, COVID)  RVPGX2
Influenza A by PCR: NEGATIVE
Influenza B by PCR: NEGATIVE
Resp Syncytial Virus by PCR: NEGATIVE
SARS Coronavirus 2 by RT PCR: NEGATIVE

## 2023-03-26 LAB — CBC WITH DIFFERENTIAL/PLATELET
Abs Immature Granulocytes: 0.11 10*3/uL — ABNORMAL HIGH (ref 0.00–0.07)
Basophils Absolute: 0 10*3/uL (ref 0.0–0.1)
Basophils Relative: 0 %
Eosinophils Absolute: 0 10*3/uL (ref 0.0–0.5)
Eosinophils Relative: 0 %
HCT: 48.3 % (ref 39.0–52.0)
Hemoglobin: 14.8 g/dL (ref 13.0–17.0)
Immature Granulocytes: 1 %
Lymphocytes Relative: 15 %
Lymphs Abs: 2 10*3/uL (ref 0.7–4.0)
MCH: 29.7 pg (ref 26.0–34.0)
MCHC: 30.6 g/dL (ref 30.0–36.0)
MCV: 97 fL (ref 80.0–100.0)
Monocytes Absolute: 0.9 10*3/uL (ref 0.1–1.0)
Monocytes Relative: 7 %
Neutro Abs: 10.6 10*3/uL — ABNORMAL HIGH (ref 1.7–7.7)
Neutrophils Relative %: 77 %
Platelets: 302 10*3/uL (ref 150–400)
RBC: 4.98 MIL/uL (ref 4.22–5.81)
RDW: 16.6 % — ABNORMAL HIGH (ref 11.5–15.5)
WBC: 13.6 10*3/uL — ABNORMAL HIGH (ref 4.0–10.5)
nRBC: 0 % (ref 0.0–0.2)

## 2023-03-26 LAB — I-STAT CG4 LACTIC ACID, ED
Lactic Acid, Venous: 3.4 mmol/L (ref 0.5–1.9)
Lactic Acid, Venous: 1.6 mmol/L (ref 0.5–1.9)

## 2023-03-26 LAB — APTT: aPTT: 27 s (ref 24–36)

## 2023-03-26 LAB — PROTIME-INR
INR: 1.6 — ABNORMAL HIGH (ref 0.8–1.2)
Prothrombin Time: 18.8 s — ABNORMAL HIGH (ref 11.4–15.2)

## 2023-03-26 LAB — MAGNESIUM: Magnesium: 1.8 mg/dL (ref 1.7–2.4)

## 2023-03-26 MED ORDER — PIPERACILLIN-TAZOBACTAM 4.5 G IVPB: 4.5000 g | Freq: Once | INTRAVENOUS | Status: DC

## 2023-03-26 MED ORDER — VANCOMYCIN HCL IN DEXTROSE 1-5 GM/200ML-% IV SOLN
1000.0000 mg | Freq: Once | INTRAVENOUS | Status: DC
  Administered 2023-03-26: 1000 mg via INTRAVENOUS
  Filled 2023-03-26: qty 200

## 2023-03-26 MED ORDER — PIPERACILLIN-TAZOBACTAM 3.375 G IVPB 30 MIN
3.3750 g | Freq: Once | INTRAVENOUS | Status: AC
Start: 2023-03-26 — End: 2023-03-26
  Administered 2023-03-26: 3.375 g via INTRAVENOUS
  Filled 2023-03-26: qty 50

## 2023-03-26 MED ORDER — NOREPINEPHRINE 4 MG/250ML-% IV SOLN
0.0000 ug/min | INTRAVENOUS | Status: DC
  Administered 2023-03-26: 2 ug/min via INTRAVENOUS
  Filled 2023-03-26: qty 250

## 2023-03-26 MED ORDER — VANCOMYCIN HCL 1500 MG/300ML IV SOLN
1500.0000 mg | Freq: Once | INTRAVENOUS | Status: AC
  Administered 2023-03-26: 1500 mg via INTRAVENOUS
  Filled 2023-03-26: qty 300

## 2023-03-26 MED ORDER — SODIUM CHLORIDE 0.9 % IV BOLUS (SEPSIS)
250.0000 mL | Freq: Once | INTRAVENOUS | Status: AC
  Administered 2023-03-26: 250 mL via INTRAVENOUS

## 2023-03-26 MED ORDER — LACTATED RINGERS IV SOLN: INTRAVENOUS | Status: DC

## 2023-03-26 MED ORDER — SODIUM CHLORIDE 0.9 % IV BOLUS (SEPSIS)
1000.0000 mL | Freq: Once | INTRAVENOUS | Status: AC
  Administered 2023-03-26: 1000 mL via INTRAVENOUS

## 2023-03-26 MED ORDER — ACETAMINOPHEN 650 MG RE SUPP
650.0000 mg | Freq: Once | RECTAL | Status: AC
  Administered 2023-03-26: 650 mg via RECTAL
  Filled 2023-03-26: qty 1

## 2023-03-26 NOTE — ED Provider Notes (Incomplete)
[place] EMERGENCY DEPARTMENT AT [facility] Provider Note  MDM   HPI/ROS:  William Mullins is a 89 y.o. male with pertinent past medical history of CVA, HTN, neurocognitive disorder who presents for decreased responsiveness.  EMS report they were called out to patient's SNF for decreased responsiveness.  Patient's current nurse states she has not seen him in 3 days and is not sure when he started becoming ill.  They report he was febrile, hypoxic to 83% on room air, tachycardic, with soft blood pressure in the 90s systolic.  This improved to 110s after receiving small fluid bolus.  Patient is unable to provide further history.  Partial dictation added by Mikeal Hawthorne, MD. Open the Dictate Later In Basket folder in [place] for iOS to finish this dictation.   Physical exam is notable for: - Pupils millimeters left, 1 mm right Diminished breath sounds throughout with scattered crackles Withdraws to pain in all 4 extremities though requires increased painful stimuli in bilateral lower extremities.  Winces to pain and resists opening his eyes.  Nonverbal.  Edentulous.  On my initial evaluation, patient is:  -Vital signs stable.*** Patient afebrile***, hemodynamically stable***, and non-toxic appearing.*** -Additional history obtained from ***  This patient's current presentation, including their history and physical exam, is most consistent with ***. Differentials include ***.     Interpretations, interventions, and the patient's course of care are documented below.      ***  -   Disposition:  {ED Dispo:29898}  Clinical Impression: No diagnosis found.  Rx / DC Orders ED Discharge Orders     None       The plan for this patient was discussed with Dr. ***, who voiced agreement and who oversaw evaluation and treatment of this patient.   Clinical Complexity A medically appropriate history, review of systems, and physical exam was performed.  My independent  interpretations of EKG, labs, and radiology are documented in the ED course above.   If decision rules were used in this patient's evaluation, they are listed below.  *** Click here for ABCD2, HEART and other calculatorsREFRESH Note before signing   Patient's presentation is most consistent with {EM COPA:27473}  Medical Decision Making Amount and/or Complexity of Data Reviewed Labs: ordered. Radiology: ordered.  Risk OTC drugs. Prescription drug management.    HPI/ROS      See MDM section for pertinent HPI and ROS. A complete ROS was performed with pertinent positives/negatives noted above.   Past Medical History:  Diagnosis Date   Arthritis    Diabetes mellitus without complication (HCC)    Hypertension     Past Surgical History:  Procedure Laterality Date   JOINT REPLACEMENT Bilateral    SHOULDER SURGERY        Physical Exam   Vitals:   03/26/23 2023 03/26/23 2024 03/26/23 2025  BP: 106/70    Pulse:  (!) 109   Resp:  (!) 31   Temp:   (!) 103.3 F (39.6 C)  TempSrc:   Rectal  SpO2:  94% 91%    Physical Exam Gen: NAD. Appears comfortable HENT: Conjunctiva clear, PERRL, EOMI. MMM.  CV: RRR. No M/R/G Pulm: Lungs CTAB with no wheezing, rales, or rhonchi.  GI: Abdomen soft, non-tender, non-distended. Normal bowel sounds in all 4 quadrants. MSK/Skin: No lower extremity edema. Extremities warm, well-perfused with 2+ pulses in all 4 extremities. Neuro: A&Ox3. GCS 15. Moves all extremities.     Procedures   If procedures were preformed on this patient,  they are listed below:  Procedures   Mikeal Hawthorne, MD Emergency Medicine PGY-2   Please note that this documentation was produced with the assistance of voice-to-text technology and may contain errors.

## 2023-03-26 NOTE — Sepsis Progress Note (Signed)
 Elink following for sepsis protocol.

## 2023-03-26 NOTE — ED Provider Notes (Incomplete)
ATTENDING SUPERVISORY NOTE I have personally viewed the imaging studies performed. I have personally seen and examined the patient, and discussed the plan of care with the resident.  I have reviewed the documentation of the resident and agree.  No diagnosis found.  .Critical Care  Performed by: Blane Ohara, MD Authorized by: Blane Ohara, MD   Critical care provider statement:    Critical care time (minutes):  80   Critical care start time:  03/26/2023 8:40 PM   Critical care end time:  03/26/2023 10:00 PM   Critical care time was exclusive of:  Separately billable procedures and treating other patients and teaching time   Critical care was necessary to treat or prevent imminent or life-threatening deterioration of the following conditions:  Sepsis   Critical care was time spent personally by me on the following activities:  Ordering and review of radiographic studies, ordering and review of laboratory studies, pulse oximetry, re-evaluation of patient's condition, ordering and performing treatments and interventions, discussions with consultants and development of treatment plan with patient or surrogate

## 2023-03-26 NOTE — ED Notes (Signed)
Okay by Dr Mikeal Hawthorne, MD, verbal order to try pt on 4L  and removed the NRB to see how pt maintains his oxygen saturations.

## 2023-03-26 NOTE — H&P (Signed)
NAME:  William Mullins, MRN:  [id number], DOB:  1931-05-12, LOS: 0 ADMISSION DATE:  03/26/2023, CONSULTATION DATE:  2/3 REFERRING MD:  Hoyt Koch, CHIEF COMPLAINT:  shock   History of Present Illness:  William Mullins is an 89 y/o gentleman with a history of Afib, CKD, HTN who presented with acute respiratory failure, reduced responsiveness, low oxygen saturations, shallow respirations, and diaphoresis from [facility]. Temp 103.3 when he arrived in the ED.  Per his son he has been more lethargic, sleeping more, eating minimally for the past several days. At baseline he gets OOB to [facility] a few hours per day, but his son is worried about his poor QOL. He is on chronic oxycodone for arthritis pain. He has been minimally responsive in the ED. After volume resuscitation he continued to have low BPs and was started on peripheral vasopressors.   Pertinent  Medical History  Afib CKD HTN PAD CVA  Significant Hospital Events: Including procedures, antibiotic start and stop dates in addition to other pertinent events   2/3 started zosyn, vanc, norepi  Interim History / Subjective:    Objective   Blood pressure 93/63, pulse 100, temperature 98.6 F (37 C), resp. rate 16, height 5\' 4"  (1.626 m), weight 71.4 kg, SpO2 96%.        Intake/Output Summary (Last 24 hours) at 03/26/2023 2334 Last data filed at 03/26/2023 2135 Gross per 24 hour  Intake 2250 ml  Output --  Net 2250 ml   Filed Weights   03/26/23 2035  Weight: 71.4 kg    Examination: General: ill appearing man lying in bed in NAD HENT: William Mullins, SK on left cheek Lungs: scattered rhonchi, tachypnea without accessory muscle use, breathing comfortably on [place] Cardiovascular: S1S2, RRR Abdomen: soft, NT Extremities: no edema, minimal muscle mass Neuro: unconscious, PERRL GU: foley  LA 3.4>1.6 Na+  154 BUN 59 Cr 2.3 AST 102 ALT 42 T bili 1.1 WBC 13.6 H/H 14.8/48.3 Platelets 302 RSV, flu, covid negative UA 6-10 WBC CT head  personally reviewed> brain atrophy, no bleeding CXR personally reviewed>no infiltrates, dilated loops of bowel  Resolved Hospital Problem list     Assessment & Plan:  Septic shock; unknown source Lactic acidosis -blood cultures pending -adding urine culture -RVP -empiric zosyn, vanc -may need abdominal imaging if follow up CXR and cultures are not revealing of a source -CXR repeat in AM -vasopressors to maintain MAP >65; currently running peripherally  Acute respiratory failure with hypoxia, concern for pneumonia -RVP -repeat CXR in AM -supplemental O2 to maintain SpO2> 90% -antibiotics  Hypernatremia -volume resuscitation -monitor  AKI -strict I/O -renally dose meds, avoid nephrotoxic meds -monitor -maintain adequate perfusion  Severe protein energy malnutrition Cachexia Debility -consider cortrak if pursuing aggressive care -resides at SNF -PT, OT when able to participate  Hyperglycemia -SSI PRN -goal BG 140-180   Best Practice (right click and "Reselect all SmartList Selections" daily)   Diet/type: NPO DVT prophylaxis prophylactic heparin  Pressure ulcer(s): N/A GI prophylaxis: N/A Lines: N/A Foley:  Yes, and it is still needed Code Status:  DNR Last date of multidisciplinary goals of care discussion [2/3 with son William Mullins ]  Labs   CBC: Recent Labs  Lab 03/26/23 2035  WBC 13.6*  NEUTROABS 10.6*  HGB 14.8  HCT 48.3  MCV 97.0  PLT 302    Basic Metabolic Panel: Recent Labs  Lab 03/26/23 2035  NA 154*  K 4.8  CL 109  CO2 27  GLUCOSE 160*  BUN 59*  CREATININE 2.30*  CALCIUM 8.3*  MG 1.8   GFR: Estimated Creatinine Clearance: 19.7 mL/min (A) (by C-G formula based on SCr of 2.3 mg/dL (H)). Recent Labs  Lab 03/26/23 2035 03/26/23 2052 03/26/23 2243  WBC 13.6*  --   --   LATICACIDVEN  --  3.4* 1.6    Liver Function Tests: Recent Labs  Lab 03/26/23 2035  AST 102*  ALT 42  ALKPHOS 70  BILITOT 1.1  PROT 7.3  ALBUMIN 3.1*    No results for input(s): "LIPASE", "AMYLASE" in the last 168 hours. No results for input(s): "AMMONIA" in the last 168 hours.  ABG No results found for: "PHART", "PCO2ART", "PO2ART", "HCO3", "TCO2", "ACIDBASEDEF", "O2SAT"   Coagulation Profile: Recent Labs  Lab 03/26/23 2035  INR 1.6*    Cardiac Enzymes: No results for input(s): "CKTOTAL", "CKMB", "CKMBINDEX", "TROPONINI" in the last 168 hours.  HbA1C: No results found for: "HGBA1C"  CBG: No results for input(s): "GLUCAP" in the last 168 hours.  Review of Systems:   Unable to be obtained due to mental status.  Past Medical History:  He,  has a past medical history of Arthritis, Diabetes mellitus without complication (HCC), and Hypertension.   Surgical History:   Past Surgical History:  Procedure Laterality Date   JOINT REPLACEMENT Bilateral    SHOULDER SURGERY       Social History:   reports that he has never smoked. He has never used smokeless tobacco. He reports that he does not drink alcohol and does not use drugs.   Family History:  His family history includes Hypertension in his father.   Allergies No Known Allergies   Home Medications  Prior to Admission medications   Medication Sig Start Date End Date Taking? Authorizing Provider  acetaminophen (TYLENOL) 500 MG tablet Take 500 mg by mouth every 6 (six) hours as needed for moderate pain (pain score 4-6) or mild pain (pain score 1-3).   Yes [provider]  allopurinol (ZYLOPRIM) 100 MG tablet Take 100 mg by mouth daily. 03/17/15  Yes [provider]  amLODipine (NORVASC) 5 MG tablet Take 5 mg by mouth daily.   Yes [provider]  aspirin EC 81 MG tablet Take 81 mg by mouth daily.   Yes [provider]  atorvastatin (LIPITOR) 10 MG tablet Take 5 mg by mouth at bedtime.   Yes [provider]  metFORMIN (GLUCOPHAGE) 500 MG tablet Take 500 mg by mouth daily.   Yes [provider]  mirtazapine (REMERON)  7.5 MG tablet Take 7.5 mg by mouth at bedtime.   Yes [provider]  oxyCODONE-acetaminophen (PERCOCET/ROXICET) 5-325 MG tablet Take 1 tablet by mouth at bedtime.   Yes [provider]  Sennosides (SENNA) 8.6 MG CAPS Take 2 capsules by mouth daily.   Yes [provider]  sertraline (ZOLOFT) 50 MG tablet Take 75 mg by mouth daily.   Yes [provider]     Critical care time: 50 min.     Steffanie Dunn, DO 03/27/23 12:49 AM [facility]  For contact information, see Amion. If no response to pager, please call PCCM consult pager. After hours, 7PM- 7AM, please call Elink.

## 2023-03-26 NOTE — ED Notes (Signed)
 Patient transported to CT

## 2023-03-26 NOTE — ED Notes (Signed)
Pt arrived on NRB, switch to 4L [place], MD requested to place pt back on NRB d/t mouth breathing

## 2023-03-26 NOTE — ED Triage Notes (Signed)
Pt arrived from [facility], staff reports found pt unresponsive, RN at facility stated "I've been on vacation, I don't know how long he has been this way". EMS reports pt is responsive on arrival but responds to pain, "hot and sweaty", lungs clear, but shallow, 83 % RA initial, placed on NRB, sats increase to 100 %. 500 ml LR bolus PTA.   EMS vitals  94/52  110 HR

## 2023-03-27 ENCOUNTER — Other Ambulatory Visit: Payer: Self-pay

## 2023-03-27 ENCOUNTER — Inpatient Hospital Stay (HOSPITAL_COMMUNITY): Payer: Medicare Other

## 2023-03-27 DIAGNOSIS — Z1152 Encounter for screening for COVID-19: Secondary | ICD-10-CM | POA: Diagnosis not present

## 2023-03-27 DIAGNOSIS — E870 Hyperosmolality and hypernatremia: Secondary | ICD-10-CM | POA: Diagnosis present

## 2023-03-27 DIAGNOSIS — D649 Anemia, unspecified: Secondary | ICD-10-CM | POA: Diagnosis present

## 2023-03-27 DIAGNOSIS — J9601 Acute respiratory failure with hypoxia: Secondary | ICD-10-CM | POA: Diagnosis present

## 2023-03-27 DIAGNOSIS — E1151 Type 2 diabetes mellitus with diabetic peripheral angiopathy without gangrene: Secondary | ICD-10-CM | POA: Diagnosis present

## 2023-03-27 DIAGNOSIS — R571 Hypovolemic shock: Secondary | ICD-10-CM | POA: Diagnosis present

## 2023-03-27 DIAGNOSIS — I63541 Cerebral infarction due to unspecified occlusion or stenosis of right cerebellar artery: Secondary | ICD-10-CM | POA: Diagnosis present

## 2023-03-27 DIAGNOSIS — Z515 Encounter for palliative care: Secondary | ICD-10-CM | POA: Diagnosis not present

## 2023-03-27 DIAGNOSIS — L89152 Pressure ulcer of sacral region, stage 2: Secondary | ICD-10-CM | POA: Diagnosis present

## 2023-03-27 DIAGNOSIS — Z743 Need for continuous supervision: Secondary | ICD-10-CM | POA: Diagnosis not present

## 2023-03-27 DIAGNOSIS — E861 Hypovolemia: Secondary | ICD-10-CM

## 2023-03-27 DIAGNOSIS — E874 Mixed disorder of acid-base balance: Secondary | ICD-10-CM | POA: Diagnosis present

## 2023-03-27 DIAGNOSIS — R6521 Severe sepsis with septic shock: Secondary | ICD-10-CM | POA: Diagnosis present

## 2023-03-27 DIAGNOSIS — R0989 Other specified symptoms and signs involving the circulatory and respiratory systems: Secondary | ICD-10-CM | POA: Diagnosis not present

## 2023-03-27 DIAGNOSIS — E43 Unspecified severe protein-calorie malnutrition: Secondary | ICD-10-CM | POA: Diagnosis present

## 2023-03-27 DIAGNOSIS — E1165 Type 2 diabetes mellitus with hyperglycemia: Secondary | ICD-10-CM | POA: Diagnosis present

## 2023-03-27 DIAGNOSIS — I10 Essential (primary) hypertension: Secondary | ICD-10-CM | POA: Diagnosis present

## 2023-03-27 DIAGNOSIS — N179 Acute kidney failure, unspecified: Secondary | ICD-10-CM | POA: Diagnosis present

## 2023-03-27 DIAGNOSIS — Z7189 Other specified counseling: Secondary | ICD-10-CM | POA: Diagnosis not present

## 2023-03-27 DIAGNOSIS — J9811 Atelectasis: Secondary | ICD-10-CM | POA: Diagnosis not present

## 2023-03-27 DIAGNOSIS — J690 Pneumonitis due to inhalation of food and vomit: Secondary | ICD-10-CM | POA: Diagnosis present

## 2023-03-27 DIAGNOSIS — Z66 Do not resuscitate: Secondary | ICD-10-CM | POA: Diagnosis present

## 2023-03-27 DIAGNOSIS — I480 Paroxysmal atrial fibrillation: Secondary | ICD-10-CM | POA: Diagnosis present

## 2023-03-27 DIAGNOSIS — R404 Transient alteration of awareness: Secondary | ICD-10-CM | POA: Diagnosis not present

## 2023-03-27 DIAGNOSIS — E876 Hypokalemia: Secondary | ICD-10-CM | POA: Diagnosis not present

## 2023-03-27 DIAGNOSIS — R0602 Shortness of breath: Secondary | ICD-10-CM | POA: Diagnosis not present

## 2023-03-27 DIAGNOSIS — R918 Other nonspecific abnormal finding of lung field: Secondary | ICD-10-CM | POA: Diagnosis not present

## 2023-03-27 DIAGNOSIS — L8990 Pressure ulcer of unspecified site, unspecified stage: Secondary | ICD-10-CM | POA: Insufficient documentation

## 2023-03-27 DIAGNOSIS — R0902 Hypoxemia: Secondary | ICD-10-CM | POA: Diagnosis not present

## 2023-03-27 DIAGNOSIS — E8809 Other disorders of plasma-protein metabolism, not elsewhere classified: Secondary | ICD-10-CM | POA: Diagnosis present

## 2023-03-27 DIAGNOSIS — J90 Pleural effusion, not elsewhere classified: Secondary | ICD-10-CM | POA: Diagnosis not present

## 2023-03-27 DIAGNOSIS — Z7401 Bed confinement status: Secondary | ICD-10-CM | POA: Diagnosis not present

## 2023-03-27 DIAGNOSIS — R531 Weakness: Secondary | ICD-10-CM | POA: Diagnosis not present

## 2023-03-27 DIAGNOSIS — I700 Atherosclerosis of aorta: Secondary | ICD-10-CM | POA: Diagnosis not present

## 2023-03-27 DIAGNOSIS — R17 Unspecified jaundice: Secondary | ICD-10-CM | POA: Diagnosis present

## 2023-03-27 DIAGNOSIS — A419 Sepsis, unspecified organism: Secondary | ICD-10-CM | POA: Diagnosis present

## 2023-03-27 DIAGNOSIS — G9341 Metabolic encephalopathy: Secondary | ICD-10-CM | POA: Diagnosis not present

## 2023-03-27 LAB — CBC
HCT: 43.9 % (ref 39.0–52.0)
HCT: 44.6 % (ref 39.0–52.0)
Hemoglobin: 13 g/dL (ref 13.0–17.0)
Hemoglobin: 13.4 g/dL (ref 13.0–17.0)
MCH: 29.5 pg (ref 26.0–34.0)
MCH: 30.2 pg (ref 26.0–34.0)
MCHC: 29.6 g/dL — ABNORMAL LOW (ref 30.0–36.0)
MCHC: 30 g/dL (ref 30.0–36.0)
MCV: 99.5 fL (ref 80.0–100.0)
MCV: 100.5 fL — ABNORMAL HIGH (ref 80.0–100.0)
Platelets: 270 10*3/uL (ref 150–400)
Platelets: 257 10*3/uL (ref 150–400)
RBC: 4.41 MIL/uL (ref 4.22–5.81)
RBC: 4.44 MIL/uL (ref 4.22–5.81)
RDW: 16.6 % — ABNORMAL HIGH (ref 11.5–15.5)
RDW: 16.5 % — ABNORMAL HIGH (ref 11.5–15.5)
WBC: 17.2 10*3/uL — ABNORMAL HIGH (ref 4.0–10.5)
WBC: 17.8 10*3/uL — ABNORMAL HIGH (ref 4.0–10.5)
nRBC: 0.1 % (ref 0.0–0.2)
nRBC: 0 % (ref 0.0–0.2)

## 2023-03-27 LAB — BASIC METABOLIC PANEL
Anion gap: 13 (ref 5–15)
BUN: 58 mg/dL — ABNORMAL HIGH (ref 8–23)
CO2: 27 mmol/L (ref 22–32)
Calcium: 7.6 mg/dL — ABNORMAL LOW (ref 8.9–10.3)
Chloride: 112 mmol/L — ABNORMAL HIGH (ref 98–111)
Creatinine, Ser: 2.1 mg/dL — ABNORMAL HIGH (ref 0.61–1.24)
GFR, Estimated: 30 mL/min — ABNORMAL LOW (ref >60)
Glucose, Bld: 141 mg/dL — ABNORMAL HIGH (ref 70–99)
Potassium: 3.9 mmol/L (ref 3.5–5.1)
Sodium: 152 mmol/L — ABNORMAL HIGH (ref 135–145)

## 2023-03-27 LAB — D-DIMER, QUANTITATIVE: D-Dimer, Quant: 16.66 ug{FEU}/mL — ABNORMAL HIGH (ref 0.00–0.50)

## 2023-03-27 LAB — BLOOD CULTURE ID PANEL (REFLEXED) - BCID2

## 2023-03-27 LAB — RESPIRATORY PANEL BY PCR

## 2023-03-27 LAB — GLUCOSE, CAPILLARY
Glucose-Capillary: 104 mg/dL — ABNORMAL HIGH (ref 70–99)
Glucose-Capillary: 112 mg/dL — ABNORMAL HIGH (ref 70–99)
Glucose-Capillary: 116 mg/dL — ABNORMAL HIGH (ref 70–99)
Glucose-Capillary: 150 mg/dL — ABNORMAL HIGH (ref 70–99)
Glucose-Capillary: 152 mg/dL — ABNORMAL HIGH (ref 70–99)
Glucose-Capillary: 88 mg/dL (ref 70–99)
Glucose-Capillary: 93 mg/dL (ref 70–99)

## 2023-03-27 LAB — MRSA NEXT GEN BY PCR, NASAL: MRSA by PCR Next Gen: DETECTED — AB

## 2023-03-27 LAB — HEMOGLOBIN A1C
Hgb A1c MFr Bld: 5.6 % (ref 4.8–5.6)
Mean Plasma Glucose: 114.02 mg/dL

## 2023-03-27 MED ORDER — INSULIN ASPART 100 UNIT/ML IJ SOLN
0.0000 [IU] | INTRAMUSCULAR | Status: DC
  Administered 2023-03-27: 1 [IU] via SUBCUTANEOUS
  Administered 2023-03-27 – 2023-03-30 (×2): 2 [IU] via SUBCUTANEOUS
  Administered 2023-03-30: 1 [IU] via SUBCUTANEOUS
  Administered 2023-03-30: 2 [IU] via SUBCUTANEOUS

## 2023-03-27 MED ORDER — NOREPINEPHRINE 4 MG/250ML-% IV SOLN
2.0000 ug/min | INTRAVENOUS | Status: DC
  Administered 2023-03-27: 1 ug/min via INTRAVENOUS

## 2023-03-27 MED ORDER — DOCUSATE SODIUM 100 MG PO CAPS: 100.0000 mg | ORAL_CAPSULE | Freq: Two times a day (BID) | ORAL | Status: DC | PRN

## 2023-03-27 MED ORDER — POLYETHYLENE GLYCOL 3350 17 G PO PACK: 17.0000 g | PACK | Freq: Every day | ORAL | Status: DC | PRN

## 2023-03-27 MED ORDER — ALBUMIN HUMAN 5 % IV SOLN
12.5000 g | Freq: Once | INTRAVENOUS | Status: AC
  Administered 2023-03-27: 12.5 g via INTRAVENOUS
  Filled 2023-03-27: qty 250

## 2023-03-27 MED ORDER — LACTATED RINGERS IV BOLUS
500.0000 mL | Freq: Once | INTRAVENOUS | Status: AC
  Administered 2023-03-27: 500 mL via INTRAVENOUS

## 2023-03-27 MED ORDER — HEPARIN SODIUM (PORCINE) 5000 UNIT/ML IJ SOLN
5000.0000 [IU] | Freq: Three times a day (TID) | INTRAMUSCULAR | Status: DC
  Administered 2023-03-27 – 2023-03-31 (×13): 5000 [IU] via SUBCUTANEOUS
  Filled 2023-03-27 (×13): qty 1

## 2023-03-27 MED ORDER — ORAL CARE MOUTH RINSE: 15.0000 mL | OROMUCOSAL | Status: DC | PRN

## 2023-03-27 MED ORDER — SODIUM CHLORIDE 0.9 % IV SOLN: 250.0000 mL | INTRAVENOUS | Status: DC

## 2023-03-27 MED ORDER — MUPIROCIN 2 % EX OINT
1.0000 | TOPICAL_OINTMENT | Freq: Two times a day (BID) | CUTANEOUS | Status: DC
  Administered 2023-03-27 – 2023-03-30 (×8): 1 via NASAL
  Filled 2023-03-27 (×3): qty 22

## 2023-03-27 MED ORDER — PIPERACILLIN-TAZOBACTAM IN DEX 2-0.25 GM/50ML IV SOLN
2.2500 g | Freq: Four times a day (QID) | INTRAVENOUS | Status: DC
  Administered 2023-03-27 – 2023-03-28 (×5): 2.25 g via INTRAVENOUS
  Filled 2023-03-27 (×6): qty 50

## 2023-03-27 MED ORDER — CHLORHEXIDINE GLUCONATE CLOTH 2 % EX PADS
6.0000 | MEDICATED_PAD | Freq: Every day | CUTANEOUS | Status: DC
  Administered 2023-03-27 – 2023-04-01 (×6): 6 via TOPICAL

## 2023-03-27 MED ORDER — ORAL CARE MOUTH RINSE
15.0000 mL | OROMUCOSAL | Status: DC
  Administered 2023-03-27 – 2023-03-31 (×16): 15 mL via OROMUCOSAL

## 2023-03-27 MED ORDER — VANCOMYCIN VARIABLE DOSE PER UNSTABLE RENAL FUNCTION (PHARMACIST DOSING): Status: DC

## 2023-03-27 MED ORDER — ONDANSETRON HCL 4 MG/2ML IJ SOLN
4.0000 mg | Freq: Four times a day (QID) | INTRAMUSCULAR | Status: DC | PRN
  Filled 2023-03-27: qty 2

## 2023-03-27 NOTE — IPAL (Signed)
  Interdisciplinary Goals of Care Family Meeting   Date carried out:: 03/27/2023  Location of the meeting: Bedside  Member's involved: Physician and Family Member or next of kin  Durable Power of Attorney or acting medical decision maker: son William Mullins   Discussion: We discussed goals of care for [name] .  I discussed [name] care with his son William Mullins at bedside in the ED. He is concerned about his father's poor baseline QOL and feels that he has suffered enough. We discussed his current with care antibiotics and vasopressors, and we discussed that we can continue this or at any point if he does not think that his father would want to continue such aggressive care in light of poor QOL at baseline, we can stop and just focus on his comfort. William Mullins feels this would be best,but when discussing with his brother, his brother wants to give aggressive therapy time to work before reassessing. He would not want him intubated or resuscitated if he codes.   Code status: Limited Code or DNR with short term and IV vasoactive drugs  Disposition: Continue current acute care   Time spent for the meeting: 10 min.  William Mullins 03/27/2023, 12:17 AM

## 2023-03-27 NOTE — TOC Initial Note (Signed)
 Transition of Care [facility]) - Initial/Assessment Note    Patient Details  Name: William Mullins MRN: [id number] Date of Birth: November 15, 1931  Transition of Care [facility]) CM/SW Contact:    Inocente GORMAN Kindle, LCSW Phone Number: 03/27/2023, 9:04 AM  Clinical Narrative:                 Patient admitted from [facility]. CSW will continue to follow.     Barriers to Discharge: Continued Medical Work up   Patient Goals and CMS Choice            Expected Discharge Plan and Services In-house Referral: Clinical Social Work     Living arrangements for the past 2 months: Skilled Nursing Facility                                      Prior Living Arrangements/Services Living arrangements for the past 2 months: Skilled Nursing Facility Lives with:: Facility Resident Patient language and need for interpreter reviewed:: Yes Do you feel safe going back to the place where you live?: Yes      Need for Family Participation in Patient Care: Yes (Comment) Care giver support system in place?: Yes (comment)   Criminal Activity/Legal Involvement Pertinent to Current Situation/Hospitalization: No - Comment as needed  Activities of Daily Living      Permission Sought/Granted Permission sought to share information with : Facility Industrial/product Designer granted to share information with : No  Share Information with NAME: Garrel  Permission granted to share info w AGENCY: [facility]  Permission granted to share info w Relationship: Son  Permission granted to share info w Contact Information: [phone]  Emotional Assessment Appearance:: Appears stated age Attitude/Demeanor/Rapport: Unable to Assess Affect (typically observed): Unable to Assess Orientation: :  (unable to follow commands) Alcohol  / Substance Use: Not Applicable Psych Involvement: No (comment)  Admission diagnosis:  Septic shock (HCC) [A41.9, R65.21] Patient Active Problem List   Diagnosis Date  Noted   Septic shock (HCC) 03/27/2023   PCP:  Cleotilde Planas, MD Pharmacy:   [id number] [place], [place] - [address] AT [facility] & [address] [place] [address] Phone: [phone] Fax: [phone]     Social Drivers of Health (SDOH) Social History: SDOH Screenings   Tobacco Use: Low Risk  (03/26/2023)   SDOH Interventions:     Readmission Risk Interventions     No data to display

## 2023-03-27 NOTE — IPAL (Signed)
  Interdisciplinary Goals of Care Family Meeting   Date carried out: 03/27/2023  Location of the meeting: Bedside  Member's involved: Physician Assistant, Bedside Registered Nurse and Family Member or next of kin  Durable Power of Attorney or acting medical decision maker: Pt's son, William Mullins who is HCPOA.  Discussion: We discussed goals of care for William Mullins. We discussed recent history and course over the past few months with gradual decline and poor quality of life. We also discussed current circumstances and prognosis. Family has decided to continue current therapies including fluids, abx, low dose norepinephrine  (capped at 10) until tomorrow morning 03/28/23. If no improvement by morning rounds, they will transition to comfort measures at that point. They are hoping that pt will have some improvement to return to SNF where his spouse is if possible.  Code status:   Code Status: Limited: Do not attempt resuscitation (DNR) -DNR-LIMITED -Do Not Intubate/DNI    Disposition: Continue current acute care  Time spent for the meeting: 20 min.   Sammi Gore, PA - [facility] [place] [facility] For pager details, please see AMION or use Epic chat  After 1900, please call [facility] for cross coverage needs 03/27/2023, 2:50 PM

## 2023-03-27 NOTE — Progress Notes (Signed)
 [facility] [phone] [facility] Liaison Note  [employer] Engineer[employer] had previously received a hospice referral for this patient prior to hospitalization.  We will continue to follow for discharge disposition.  Please call with any hospice related questions.  Thank you, Randine Nail, BSN, [facility] [phone]

## 2023-03-27 NOTE — Plan of Care (Signed)
  Problem: Education: Goal: Ability to describe self-care measures that may prevent or decrease complications (Diabetes Survival Skills Education) will improve Outcome: Not Progressing   Problem: Coping: Goal: Ability to adjust to condition or change in health will improve Outcome: Not Progressing   Problem: Fluid Volume: Goal: Ability to maintain a balanced intake and output will improve Outcome: Not Progressing   Problem: Health Behavior/Discharge Planning: Goal: Ability to manage health-related needs will improve Outcome: Not Progressing   Problem: Skin Integrity: Goal: Risk for impaired skin integrity will decrease Outcome: Not Progressing   Problem: Tissue Perfusion: Goal: Adequacy of tissue perfusion will improve Outcome: Not Progressing   Problem: Education: Goal: Knowledge of General Education information will improve Description: Including pain rating scale, medication(s)/side effects and non-pharmacologic comfort measures Outcome: Not Progressing   Problem: Health Behavior/Discharge Planning: Goal: Ability to manage health-related needs will improve Outcome: Not Progressing   Problem: Clinical Measurements: Goal: Will remain free from infection Outcome: Not Progressing

## 2023-03-27 NOTE — Progress Notes (Signed)
 PHARMACY - PHYSICIAN COMMUNICATION CRITICAL VALUE ALERT - BLOOD CULTURE IDENTIFICATION (BCID)  William Mullins is an 89 y.o. male who presented to [facility] on 03/26/2023 with AMS, shock of unknown cause.  Assessment: Blood culture growing Staph spp in 1 of 4 bottles, no resistance.  Could be a contaminant.  Name of physician (or Provider) Contacted: Dr. Arlinda  Current antibiotics: vancomycin  and Zosyn   Changes to prescribed antibiotics recommended:  Patient is on recommended antibiotics - No changes needed  Results for orders placed or performed during the hospital encounter of 03/26/23  Blood Culture ID Panel (Reflexed) (Collected: 03/26/2023  8:35 PM)  Result Value Ref Range   Enterococcus faecalis NOT DETECTED NOT DETECTED   Enterococcus Faecium NOT DETECTED NOT DETECTED   Listeria monocytogenes NOT DETECTED NOT DETECTED   Staphylococcus species DETECTED (A) NOT DETECTED   Staphylococcus aureus (BCID) NOT DETECTED NOT DETECTED   Staphylococcus epidermidis NOT DETECTED NOT DETECTED   Staphylococcus lugdunensis NOT DETECTED NOT DETECTED   Streptococcus species NOT DETECTED NOT DETECTED   Streptococcus agalactiae NOT DETECTED NOT DETECTED   Streptococcus pneumoniae NOT DETECTED NOT DETECTED   Streptococcus pyogenes NOT DETECTED NOT DETECTED   A.calcoaceticus-baumannii NOT DETECTED NOT DETECTED   Bacteroides fragilis NOT DETECTED NOT DETECTED   Enterobacterales NOT DETECTED NOT DETECTED   Enterobacter cloacae complex NOT DETECTED NOT DETECTED   Escherichia coli NOT DETECTED NOT DETECTED   Klebsiella aerogenes NOT DETECTED NOT DETECTED   Klebsiella oxytoca NOT DETECTED NOT DETECTED   Klebsiella pneumoniae NOT DETECTED NOT DETECTED   Proteus species NOT DETECTED NOT DETECTED   Salmonella species NOT DETECTED NOT DETECTED   Serratia marcescens NOT DETECTED NOT DETECTED   Haemophilus influenzae NOT DETECTED NOT DETECTED   Neisseria meningitidis NOT DETECTED NOT DETECTED    Pseudomonas aeruginosa NOT DETECTED NOT DETECTED   Stenotrophomonas maltophilia NOT DETECTED NOT DETECTED   Candida albicans NOT DETECTED NOT DETECTED   Candida auris NOT DETECTED NOT DETECTED   Candida glabrata NOT DETECTED NOT DETECTED   Candida krusei NOT DETECTED NOT DETECTED   Candida parapsilosis NOT DETECTED NOT DETECTED   Candida tropicalis NOT DETECTED NOT DETECTED   Cryptococcus neoformans/gattii NOT DETECTED NOT DETECTED    Roopa Graver D. Lendell, PharmD, BCPS, BCCCP 03/27/2023, 6:12 PM

## 2023-03-27 NOTE — Progress Notes (Signed)
 Pharmacy Antibiotic Note  William Mullins is a 89 y.o. male admitted on 03/26/2023 with sepsis.  Pharmacy has been consulted for vancomycin  and Zosyn  dosing.  Pt w/ AKI; per Care Everywhere review baseline SCr <1, now 2.3.  Plan: Rec'd vanc 1500mg  load in ED; will monitor SCr +/- vanc level prior to redosing. Zosyn  2.25g IV Q6H.  Height: 5' 4 (162.6 cm) Weight: 71.4 kg (157 lb 6.5 oz) IBW/kg (Calculated) : 59.2  Temp (24hrs), Avg:99.2 F (37.3 C), Min:98.2 F (36.8 C), Max:103.3 F (39.6 C)  Recent Labs  Lab 03/26/23 2035 03/26/23 2052 03/26/23 2243  WBC 13.6*  --   --   CREATININE 2.30*  --   --   LATICACIDVEN  --  3.4* 1.6    Estimated Creatinine Clearance: 19.7 mL/min (A) (by C-G formula based on SCr of 2.3 mg/dL (H)).    No Known Allergies  Thank you for allowing pharmacy to be a part of this patient's care.  Marvetta Dauphin, PharmD, BCPS  03/27/2023 12:33 AM

## 2023-03-27 NOTE — Progress Notes (Signed)
 eLink Physician-Brief Progress Note Patient Name: William Mullins DOB: 12/31/1931 MRN: [id number]   Date of Service  03/27/2023  HPI/Events of Note  89 y/o gentleman with a history of Afib, CKD, HTN who presented with acute respiratory failure, reduced responsiveness, low oxygen saturations, shallow respirations, and diaphoresis admitted with shock requiring peripheral vasopressors.  He was initially febrile but now afebrile.  Saturating 100% on 3 L.  Vital signs within normal limits now off of norepinephrine  infusion.  Results consistent with hyponatremia, elevated creatinine, anion gap metabolic acidosis with chronic alkalosis.  Leukocytosis present.  CT head concerning for potential left occipital infarct.  Chest radiograph fairly unremarkable.  eICU Interventions  Maintain broad-spectrum antibiotics with vancomycin  and Zosyn .  Norepinephrine  as needed to maintain MAP greater than 65.  Maintain LR infusion with ongoing free water deficit.  Consider MRI brain with/without if failure to progress neurologically.  DVT prophylaxis with heparin  GI prophylaxis not indicated        Amenda Duclos 03/27/2023, 2:43 AM

## 2023-03-27 NOTE — Progress Notes (Signed)
 NAME:  William Mullins, MRN:  [id number], DOB:  12-01-1931, LOS: 0 ADMISSION DATE:  03/26/2023, CONSULTATION DATE:  2/3 REFERRING MD:  Jules, CHIEF COMPLAINT:  shock   History of Present Illness:  William Mullins is an 89 y/o gentleman with a history of Afib, CKD, HTN who presented with acute respiratory failure, reduced responsiveness, low oxygen saturations, shallow respirations, and diaphoresis from [name]. Temp 103.3 when he arrived in the ED.  Per his son he has been more lethargic, sleeping more, eating minimally for the past several days. At baseline he gets OOB to [facility] a few hours per day, but his son is worried about his poor QOL. He is on chronic oxycodone  for arthritis pain. He has been minimally responsive in the ED. After volume resuscitation he continued to have low BPs and was started on peripheral vasopressors.   Pertinent  Medical History  Afib CKD HTN PAD CVA  Significant Hospital Events: Including procedures, antibiotic start and stop dates in addition to other pertinent events   2/3 started zosyn , vanc, norepi  Interim History / Subjective:  Remains on 2 NE. Tried to turn off but MAP went from 80 to 62. Seems quiet dry still. Son at bedside contemplating comfort measures.   Objective   Blood pressure 90/61, pulse (!) 101, temperature 98.2 F (36.8 C), resp. rate 18, height 5' 4 (1.626 m), weight 63.8 kg, SpO2 97%.        Intake/Output Summary (Last 24 hours) at 03/27/2023 1112 Last data filed at 03/27/2023 0900 Gross per 24 hour  Intake 4597.43 ml  Output 250 ml  Net 4347.43 ml   Filed Weights   03/26/23 2035 03/27/23 0315  Weight: 71.4 kg 63.8 kg    Examination: General: Elderly male, chronically ill appearing, resting in bed, in NAD. Neuro: Somnolent. Opens eyes to noxious stimuli but does not follow any commands. HEENT: [place]/AT. Sclerae anicteric. MM dry. Cardiovascular: Tachy, regular, no M/R/G.  Lungs: Respirations even and unlabored.  CTA  bilaterally, No W/R/R. Abdomen: BS x 4, abd tender to deep palpation. Abd soft, NT/ND.  Musculoskeletal: No gross deformities, no edema.  Skin: Intact, warm, no rashes.   Assessment & Plan:   Septic shock; unknown source at this point. Shock exacerbated by hypovolemia. -continue empiric vanc/zosyn  -continue NE but wean to off -continue fluids, additional 500cc bolus and 1 dose 5% Albumin  now then continue maintenance at 150/hr -follow cultures -may need abdominal imaging if cultures are not revealing of a source and depending on family decisions regarding course  Possible L occipital acute vs subacute infarct on CT head. - Consider MRI if no improvement in mental status as sepsis treated. Will also need to follow family decisions regarding course. They are contemplating comfort measures.  Hypernatremia, hyperchloremia, AKI - seems volume deplete -Fluids as above -Monitor BMP -volume resuscitation -monitor  Severe protein energy malnutrition Cachexia Debility - resides at SNF -Family to discuss amongst themselves regarding goals of care, son contemplating comfort but wants to discuss with another brother who is HCPOA. Son at bedside states pt has declined over the past 2 - 3 months and is barely interactive any longer and seems to have been on deaths doorstep for at least 2 months now.  Hyperglycemia -SSI PRN -goal BG 140-180   Best Practice (right click and Reselect all SmartList Selections daily)   Diet/type: NPO DVT prophylaxis prophylactic heparin   Pressure ulcer(s): N/A GI prophylaxis: N/A Lines: N/A Foley:  Yes, and it is still  needed Code Status:  DNR Last date of multidisciplinary goals of care discussion [2/3 with son William Mullins ]  Critical care time: 30 min.   Sammi Gore, PA - C  Pulmonary & Critical Care Medicine For pager details, please see AMION or use Epic chat  After 1900, please call [facility] for cross coverage needs 03/27/2023, 11:21 AM

## 2023-03-28 DIAGNOSIS — R6521 Severe sepsis with septic shock: Secondary | ICD-10-CM | POA: Diagnosis not present

## 2023-03-28 DIAGNOSIS — A419 Sepsis, unspecified organism: Secondary | ICD-10-CM | POA: Diagnosis not present

## 2023-03-28 LAB — BASIC METABOLIC PANEL
Anion gap: 16 — ABNORMAL HIGH (ref 5–15)
BUN: 45 mg/dL — ABNORMAL HIGH (ref 8–23)
CO2: 22 mmol/L (ref 22–32)
Calcium: 7.5 mg/dL — ABNORMAL LOW (ref 8.9–10.3)
Chloride: 110 mmol/L (ref 98–111)
Creatinine, Ser: 1.47 mg/dL — ABNORMAL HIGH (ref 0.61–1.24)
GFR, Estimated: 45 mL/min — ABNORMAL LOW (ref >60)
Glucose, Bld: 122 mg/dL — ABNORMAL HIGH (ref 70–99)
Potassium: 3.6 mmol/L (ref 3.5–5.1)
Sodium: 148 mmol/L — ABNORMAL HIGH (ref 135–145)

## 2023-03-28 LAB — CBC
HCT: 41.2 % (ref 39.0–52.0)
Hemoglobin: 12.7 g/dL — ABNORMAL LOW (ref 13.0–17.0)
MCH: 30.1 pg (ref 26.0–34.0)
MCHC: 30.8 g/dL (ref 30.0–36.0)
MCV: 97.6 fL (ref 80.0–100.0)
Platelets: 247 10*3/uL (ref 150–400)
RBC: 4.22 MIL/uL (ref 4.22–5.81)
RDW: 16.5 % — ABNORMAL HIGH (ref 11.5–15.5)
WBC: 18 10*3/uL — ABNORMAL HIGH (ref 4.0–10.5)
nRBC: 0.3 % — ABNORMAL HIGH (ref 0.0–0.2)

## 2023-03-28 LAB — AMMONIA: Ammonia: 24 umol/L (ref 9–35)

## 2023-03-28 LAB — GLUCOSE, CAPILLARY
Glucose-Capillary: 101 mg/dL — ABNORMAL HIGH (ref 70–99)
Glucose-Capillary: 107 mg/dL — ABNORMAL HIGH (ref 70–99)
Glucose-Capillary: 118 mg/dL — ABNORMAL HIGH (ref 70–99)
Glucose-Capillary: 93 mg/dL (ref 70–99)
Glucose-Capillary: 94 mg/dL (ref 70–99)

## 2023-03-28 LAB — PROCALCITONIN: Procalcitonin: 0.38 ng/mL

## 2023-03-28 LAB — VANCOMYCIN, RANDOM: Vancomycin Rm: 12 ug/mL

## 2023-03-28 MED ORDER — VANCOMYCIN HCL 750 MG/150ML IV SOLN
750.0000 mg | INTRAVENOUS | Status: DC
  Filled 2023-03-28: qty 150

## 2023-03-28 MED ORDER — ENSURE ENLIVE PO LIQD
237.0000 mL | Freq: Two times a day (BID) | ORAL | Status: DC
  Administered 2023-03-29 (×2): 237 mL via ORAL

## 2023-03-28 MED ORDER — VANCOMYCIN HCL 750 MG IV SOLR
750.0000 mg | INTRAVENOUS | Status: AC
  Administered 2023-03-28 – 2023-03-30 (×3): 750 mg via INTRAVENOUS
  Filled 2023-03-28 (×3): qty 15

## 2023-03-28 MED ORDER — PIPERACILLIN-TAZOBACTAM 3.375 G IVPB
3.3750 g | Freq: Three times a day (TID) | INTRAVENOUS | Status: AC
  Administered 2023-03-28 – 2023-03-30 (×8): 3.375 g via INTRAVENOUS
  Filled 2023-03-28 (×8): qty 50

## 2023-03-28 NOTE — Evaluation (Signed)
 Clinical/Bedside Swallow Evaluation Patient Details  Name: William Mullins MRN: [id number] Date of Birth: [birth year]  Today's Date: 03/28/2023 Time: SLP Start Time (ACUTE ONLY): 1013 SLP Stop Time (ACUTE ONLY): 1041 SLP Time Calculation (min) (ACUTE ONLY): 28 min  Past Medical History:  Past Medical History:  Diagnosis Date   Arthritis    Diabetes mellitus without complication (HCC)    Hypertension    Past Surgical History:  Past Surgical History:  Procedure Laterality Date   JOINT REPLACEMENT Bilateral    SHOULDER SURGERY     HPI:  William Mullins is an 89 yo male presenting to ED 2/3 with acute respiratory failure, reduced responsiveness, and diaphoresis. Admitted with septic shock exacerbated by hypovolemia and AKI. CXR shows low lung volumes with patchy atelectasis at the L base. CTH with possible L occipital hypodensity suspicious for acute to subacute infarct. MRI Brain pending improvement in mental status. PMH includes A-fib, CKD, HTN, prior CVA    Assessment / Plan / Recommendation  Clinical Impression  Per family, pt is increasingly alert today although still requires cueing for sustained alertness. He has a significant buildup of dried secretions lining his labial, lingual, and palatal surfaces. SLP provided thorough oral care with pt consistently biting down on sponge. Trials of thin liquids consistently resulted in immediate throat clearance or coughing. Ice chips and purees observed without s/s of aspiration, although he exhibits multiple swallows with all boluses which may be representative of pharyngeal dysphagia. He took one tspn bite of puree and declined any further solids. Discussed presentation with pt and his sons, who state they do not wish for pt to participate in a swallow study at this time. Education was provided regarding aspiration precautions. At present, recommend pt remain NPO except for sips of water or ice chips in moderation and only following thorough oral  care. Discussed with RN. SLP will continue following pending GOC. SLP Visit Diagnosis: Dysphagia, unspecified (R13.10)    Aspiration Risk  Moderate aspiration risk    Diet Recommendation NPO;Ice chips PRN after oral care;Free water protocol after oral care    Medication Administration: Via alternative means    Other  Recommendations Oral Care Recommendations: Oral care QID;Oral care prior to ice chip/H20;Staff/trained caregiver to provide oral care    Recommendations for follow up therapy are one component of a multi-disciplinary discharge planning process, led by the attending physician.  Recommendations may be updated based on patient status, additional functional criteria and insurance authorization.  Follow up Recommendations Skilled nursing-short term rehab (<3 hours/day)      Assistance Recommended at Discharge    Functional Status Assessment Patient has had a recent decline in their functional status and demonstrates the ability to make significant improvements in function in a reasonable and predictable amount of time.  Frequency and Duration min 2x/week  2 weeks       Prognosis Prognosis for improved oropharyngeal function: Fair Barriers to Reach Goals: Time post onset;Severity of deficits      Swallow Study   General HPI: William Mullins is an 89 yo male presenting to ED 2/3 with acute respiratory failure, reduced responsiveness, and diaphoresis. Admitted with septic shock exacerbated by hypovolemia and AKI. CXR shows low lung volumes with patchy atelectasis at the L base. CTH with possible L occipital hypodensity suspicious for acute to subacute infarct. MRI Brain pending improvement in mental status. PMH includes A-fib, CKD, HTN, prior CVA Type of Study: Bedside Swallow Evaluation Previous Swallow Assessment: none in chart Diet  Prior to this Study: NPO Temperature Spikes Noted: No Respiratory Status: Nasal cannula History of Recent Intubation: No Behavior/Cognition:  Alert;Cooperative;Requires cueing Oral Cavity Assessment: Dry;Dried secretions Oral Care Completed by SLP: Yes Oral Cavity - Dentition: Poor condition;Missing dentition Vision: Functional for self-feeding Self-Feeding Abilities: Total assist Patient Positioning: Upright in bed Baseline Vocal Quality: Low vocal intensity Volitional Cough: Weak Volitional Swallow: Able to elicit    Oral/Motor/Sensory Function Overall Oral Motor/Sensory Function: Within functional limits   Ice Chips Ice chips: Within functional limits Presentation: Spoon   Thin Liquid Thin Liquid: Impaired Presentation: Straw Pharyngeal  Phase Impairments: Suspected delayed Swallow;Multiple swallows;Throat Clearing - Immediate;Cough - Immediate    Nectar Thick Nectar Thick Liquid: Not tested   Honey Thick Honey Thick Liquid: Not tested   Puree Puree: Within functional limits Presentation: Spoon   Solid     Solid: Not tested      Damien Blumenthal, M.A., CF-SLP Speech Language Pathology, Acute Rehabilitation Services  Secure Chat preferred [phone]  03/28/2023,11:06 AM

## 2023-03-28 NOTE — Progress Notes (Signed)
   NAME:  JERAMEY LANUZA, MRN:  [id number], DOB:  1931/08/19, LOS: 1 ADMISSION DATE:  03/26/2023, CONSULTATION DATE:  2/3 REFERRING MD:  Zavitz-EDP, CHIEF COMPLAINT:  shock   History of Present Illness:  Mr. Din is an 89 y/o gentleman with a history of Afib, CKD, HTN who presented with acute respiratory failure, reduced responsiveness, low oxygen saturations, shallow respirations, and diaphoresis from [facility]. Temp 103.3 when he arrived in the ED.  Per his son he has been more lethargic, sleeping more, eating minimally for the past several days. At baseline he gets OOB to [facility] a few hours per day, but his son is worried about his poor QOL. He is on chronic oxycodone  for arthritis pain. He has been minimally responsive in the ED. After volume resuscitation he continued to have low BPs and was started on peripheral vasopressors.   Pertinent  Medical History  Afib CKD HTN PAD CVA  Significant Hospital Events: Including procedures, antibiotic start and stop dates in addition to other pertinent events   2/3 started zosyn , vanc, norepi  Interim History / Subjective:  Norepinephrine  is off.  Patient is more awake today failed swallow evaluation. Has defervescence since admission.  Objective   Blood pressure (!) 88/61, pulse 86, temperature 97.9 F (36.6 C), resp. rate 15, height 5' 4 (1.626 m), weight 67.7 kg, SpO2 100%.        Intake/Output Summary (Last 24 hours) at 03/28/2023 1153 Last data filed at 03/28/2023 1100 Gross per 24 hour  Intake 4671.62 ml  Output 835 ml  Net 3836.62 ml   Filed Weights   03/26/23 2035 03/27/23 0315 03/28/23 0500  Weight: 71.4 kg 63.8 kg 67.7 kg    Examination: General: Elderly male, frail appearing.  In no distress.  Awake. Neuro: Tracks to voice.  Follows commands. HEENT: Mucous membranes are still dry Cardiovascular: Pulse is regular.  Heart sounds are unremarkable. Lungs: No respiratory distress.  Lungs are clear. Abdomen: Soft and  nontender. Musculoskeletal: No gross deformities, no edema.  Skin: Intact, warm, no rashes.  Ancillary tests personally reviewed:  Staph hominis isolated blood culture. Creatinine has improved to 1.47 Sodium improving to 148 Persistent leukocytosis 18.0 Assessment & Plan:   Combination of septic and hypovolemic shock.  Source unknown. Resolving AKI Possible L occipital acute vs subacute infarct on CT head. Severe protein energy malnutrition Cachexia Debility - resides at SNF Stress hyperglycemia  Plan:  -Continue IV fluids until patient taking oral intake adequately. -Patient to be a poor candidate for long-term feeding tube which would significantly impact his quality of life.  Would prefer to give him time and challenge him oral intake and follow him clinically. -Complete 5 days of antibiotics for sepsis of unclear etiology.  Best Practice (right click and Reselect all SmartList Selections daily)   Diet/type: Pured diet. DVT prophylaxis prophylactic heparin   Pressure ulcer(s): N/A GI prophylaxis: N/A Lines: N/A Foley:  Yes, and it is still needed Code Status:  DNR/DNI Last date of multidisciplinary goals of care discussion: 2/5 both sons updated at the bedside.  Fredia Alderton, MD [facility] ICU Physician [facility] [place] [facility]  Pager: [phone] Or Epic Secure Chat After hours: [phone].  03/28/2023, 12:00 PM

## 2023-03-28 NOTE — Evaluation (Addendum)
 Physical Therapy Evaluation Patient Details Name: LATEEF JUNCAJ MRN: [id number] DOB: 03-04-1931 Today's Date: 03/28/2023  History of Present Illness  Pt is an 89 y.o. male presenting from [facility] where staff found pt unresponsive. Admitted with septic shock, lactic acidosis, respiratory failure with hypoxia,m hypernatremia, AKI, hyperglycemia. CT head concerning for potential left occipital infarct. PMH: Afib, CKD, HTN, PAD, DM, RA.   Clinical Impression  GRIFFITH SANTILLI is 89 y.o. male admitted with above HPI and diagnosis. Patient is currently limited by functional impairments below (see PT problem list). Patient is LT resident at [facility] and per family report requires 1-2 assist and sometimes use of mechanical lift for bed<>chair transfers at baseline. Pt is hypersensitive to light touch with greatest sensitivity at bil hands and Rt lower leg. Pt's ROM is severely limited and unable to passively flex knees past ~10* from extension. Pt with no initiation for purposeful UE/LE movement. Will continue to assess and trial mobility with pt as able and as appropriate based on GOC.       If plan is discharge home, recommend the following: Two people to help with walking and/or transfers;Two people to help with bathing/dressing/bathroom;Assistance with cooking/housework;Assistance with feeding;Direct supervision/assist for medications management;Direct supervision/assist for financial management;Assist for transportation;Help with stairs or ramp for entrance;Supervision due to cognitive status   Can travel by private vehicle        Equipment Recommendations None recommended by PT  Recommendations for Other Services       Functional Status Assessment Patient has had a recent decline in their functional status and/or demonstrates limited ability to make significant improvements in function in a reasonable and predictable amount of time     Precautions / Restrictions Precautions Precautions:  Fall Restrictions Weight Bearing Restrictions Per Provider Order: No      Mobility  Bed Mobility Overal bed mobility: Needs Assistance             General bed mobility comments: TOTAL assist for all aspects of mobility    Transfers                   General transfer comment: unsafe and pt unable to tolerate at this time    Ambulation/Gait                  Stairs            Wheelchair Mobility     Tilt Bed    Modified Rankin (Stroke Patients Only)       Balance                                             Pertinent Vitals/Pain Pain Assessment Pain Assessment: Faces Faces Pain Scale: Hurts even more Pain Location: generalize but greatest at bil hands and Rt foot Pain Intervention(s): Limited activity within patient's tolerance, Monitored during session, Repositioned    Home Living Family/patient expects to be discharged to:: Skilled nursing facility                   Additional Comments: Per pt's son (who is a resident at the same SNF along with pt's wife) pt developed a sacral wound at SNF and has been treated for it over the last 2 weeks. son reports pt was spending ~10 hours a day in [facility] and since they found wound have limited it to ~  4 hours/day.    Prior Function Prior Level of Function : Needs assist       Physical Assist : ADLs (physical);Mobility (physical) Mobility (physical): Bed mobility;Transfers ADLs (physical): Feeding;Grooming;Bathing;Dressing;Toileting;IADLs Mobility Comments: pt requires 1-2 people to perform bed mobility, typically 2 person for bed<>wc transfers and sometimes the staff uses a lift to transfer. ADLs Comments: Total assist for ADL's as pt is significantly limited by ROM deficits and weakness.     Extremity/Trunk Assessment   Upper Extremity Assessment Upper Extremity Assessment: Defer to OT evaluation    Lower Extremity Assessment Lower Extremity Assessment: RLE  deficits/detail;LLE deficits/detail;Generalized weakness RLE Deficits / Details: pt sensitive at Rt lower leg. pt able to wiggle great toes slightly. unable to initiate any AROM at hip, knee, ankle. PROM greatly restricted and pt resisting knee flexion. Ankle ROM limited to neutral for dorsiflexion. RLE: Unable to fully assess due to pain LLE Deficits / Details: pt sensitive at Rt lower leg. pt able to wiggle great toes slightly. unable to initiate any AROM at hip, knee, ankle. PROM greatly restricted and pt resisting knee flexion. Ankle ROM limited to neutral for dorsiflexion. LLE: Unable to fully assess due to pain    Cervical / Trunk Assessment Cervical / Trunk Assessment: Kyphotic (frail)  Communication   Communication Communication: Difficulty communicating thoughts/reduced clarity of speech  Cognition Arousal: Alert Behavior During Therapy: Flat affect Overall Cognitive Status: Impaired/Different from baseline Area of Impairment: Orientation, Attention, Memory, Following commands, Awareness, Problem solving                 Orientation Level: Disoriented to, Place, Time, Situation Current Attention Level: Focused Memory: Decreased short-term memory Following Commands: Follows one step commands inconsistently   Awareness: Intellectual Problem Solving: Slow processing, Decreased initiation, Requires verbal cues, Difficulty sequencing General Comments: family did not confirm cognitive baseline. pt moaning short repeated sounds [id number] when covers lifting or        General Comments      Exercises     Assessment/Plan    PT Assessment Patient needs continued PT services (trial pt)  PT Problem List Decreased strength;Decreased range of motion;Decreased activity tolerance;Decreased balance;Decreased mobility;Decreased coordination;Decreased cognition;Decreased knowledge of use of DME;Decreased safety awareness;Decreased knowledge of precautions;Decreased skin  integrity;Pain       PT Treatment Interventions DME instruction;Gait training;Stair training;Functional mobility training;Therapeutic activities;Therapeutic exercise;Balance training;Neuromuscular re-education;Cognitive remediation;Patient/family education;Manual techniques;Wheelchair mobility training    PT Goals (Current goals can be found in the Care Plan section)  Acute Rehab PT Goals Patient Stated Goal: to attempt mobilizing PT Goal Formulation: With family Time For Goal Achievement: 04/11/23 Potential to Achieve Goals: Poor    Frequency Min 1X/week     Co-evaluation PT/OT/SLP Co-Evaluation/Treatment: Yes Reason for Co-Treatment: For patient/therapist safety;To address functional/ADL transfers PT goals addressed during session: Strengthening/ROM OT goals addressed during session: Strengthening/ROM;ADL's and self-care       AM-PAC PT 6 Clicks Mobility  Outcome Measure Help needed turning from your back to your side while in a flat bed without using bedrails?: Total Help needed moving from lying on your back to sitting on the side of a flat bed without using bedrails?: Total Help needed moving to and from a bed to a chair (including a wheelchair)?: Total Help needed standing up from a chair using your arms (e.g., wheelchair or bedside chair)?: Total Help needed to walk in hospital room?: Total Help needed climbing 3-5 steps with a railing? : Total 6 Click Score: 6    End of Session  Activity Tolerance: Patient limited by pain Patient left: in bed;with call bell/phone within reach;with bed alarm set Nurse Communication: Mobility status;Need for lift equipment PT Visit Diagnosis: Other abnormalities of gait and mobility (R26.89);Unsteadiness on feet (R26.81);Muscle weakness (generalized) (M62.81);Difficulty in walking, not elsewhere classified (R26.2);Other symptoms and signs involving the nervous system (R29.898);Pain Pain - part of body:  (bil hands and generalized)     Time: 8485-8462 PT Time Calculation (min) (ACUTE ONLY): 23 min   Charges:   PT Evaluation $PT Eval Moderate Complexity: 1 Mod   PT General Charges $$ ACUTE PT VISIT: 1 Visit         Vernell DONEEN KLEIN, DPT Acute Rehabilitation Services Office [phone]  03/28/23 4:34 PM

## 2023-03-28 NOTE — Evaluation (Signed)
 Occupational Therapy Evaluation Patient Details Name: William Mullins MRN: [id number] DOB: Mar 26, 1931 Today's Date: 03/28/2023   History of Present Illness Pt is an 89 y.o. male presenting from [facility] where staff found pt unresponsive. Admitted with septic shock, lactic acidosis, respiratory failure with hypoxia,m hypernatremia, AKI, hyperglycemia. CT head concerning for potential left occipital infarct. PMH: Afib, CKD, HTN, PAD, DM, RA.   Clinical Impression   PTA, pt lived at SNF and received assist with ADL and IADL; pt typically able to transfer with assist or use of hoyer to wheelchair daily and stays in wheelchair 10+ hours/day per son, but recent sacral wound has limited time in wheelchair. Pt is very sensitive to touch on BUE and son reports has needed assist with grooming and feeding at total A level for quite some time. Was unable to tolerate ROM of hands today and minimally at elbow/shoulder. Pt with no initiation or purposeful UE/LE movement and in pain asking to be left alone with attempts. Will follow 1-2 more sessions to determine appropriateness of transfer training and tolerance of upright positioning to facilitate PLOF based on GOC. Recommend OT at long term acute hospital vs return to LTC without follow up.     If plan is discharge home, recommend the following: Other (comment) (total care)    Functional Status Assessment  Patient has had a recent decline in their functional status and/or demonstrates limited ability to make significant improvements in function in a reasonable and predictable amount of time  Equipment Recommendations  None recommended by OT    Recommendations for Other Services       Precautions / Restrictions Precautions Precautions: Fall Restrictions Weight Bearing Restrictions Per Provider Order: No      Mobility Bed Mobility Overal bed mobility: Needs Assistance             General bed mobility comments: TOTAL assist for all  aspects of mobility    Transfers                   General transfer comment: unsafe and pt unable to tolerate at this time      Balance                                           ADL either performed or assessed with clinical judgement   ADL Overall ADL's : Needs assistance/impaired                                       General ADL Comments: total A for all aspects of care     Vision   Additional Comments: Per son pt sees well at baseline. pt denies changes in vision. Able to read OT tag with significantly increased time     Perception         Praxis         Pertinent Vitals/Pain Pain Assessment Pain Assessment: Faces Faces Pain Scale: Hurts even more Pain Location: generalize but greatest at bil hands and Rt foot Pain Intervention(s): Limited activity within patient's tolerance, Monitored during session     Extremity/Trunk Assessment Upper Extremity Assessment Upper Extremity Assessment: Generalized weakness;RUE deficits/detail;LUE deficits/detail RUE Deficits / Details: bil hand contractures at baseline does not use for self feeding at baseline. Very sensitive to touch with pain  during all attempts at ROM of arms; skin intact LUE Deficits / Details: bil hand contractures at baseline does not use for self feeding at baseline. Very sensitive to touch with pain during all attempts at ROM of arms. skin intact   Lower Extremity Assessment Lower Extremity Assessment: Defer to PT evaluation RLE Deficits / Details: pt sensitive at Rt lower leg. pt able to wiggle great toes slightly. unable to initiate any AROM at hip, knee, ankle. PROM greatly restricted and pt resisting knee flexion. Ankle ROM limited to neutral for dorsiflexion. RLE: Unable to fully assess due to pain LLE Deficits / Details: pt sensitive at Rt lower leg. pt able to wiggle great toes slightly. unable to initiate any AROM at hip, knee, ankle. PROM greatly restricted  and pt resisting knee flexion. Ankle ROM limited to neutral for dorsiflexion. LLE: Unable to fully assess due to pain   Cervical / Trunk Assessment Cervical / Trunk Assessment: Kyphotic (frail)   Communication Communication Communication: Difficulty communicating thoughts/reduced clarity of speech   Cognition Arousal: Alert Behavior During Therapy: Flat affect Overall Cognitive Status: Impaired/Different from baseline Area of Impairment: Orientation, Attention, Memory, Following commands, Awareness, Problem solving                 Orientation Level: Disoriented to, Place, Time, Situation Current Attention Level: Focused Memory: Decreased short-term memory Following Commands: Follows one step commands inconsistently   Awareness: Intellectual Problem Solving: Slow processing, Decreased initiation, Requires verbal cues, Difficulty sequencing General Comments: family did not confirm cognitive baseline. pt moaning short repeated sounds [id number] when covers lifting or     General Comments  BP 80s/60s    Exercises     Shoulder Instructions      Home Living Family/patient expects to be discharged to:: Skilled nursing facility                                 Additional Comments: Per pt's son (who is a resident at the same SNF along with pt's wife) pt developed a sacral wound at SNF and has been treated for it over the last 2 weeks. son reports pt was spending ~10 hours a day in [facility] and since they found wound have limited it to ~ 4 hours/day.      Prior Functioning/Environment Prior Level of Function : Needs assist       Physical Assist : ADLs (physical);Mobility (physical) Mobility (physical): Bed mobility;Transfers ADLs (physical): Feeding;Grooming;Bathing;Dressing;Toileting;IADLs Mobility Comments: pt requires 1-2 people to perform bed mobility, typically 2 person for bed<>wc transfers and sometimes the staff uses a lift to transfer. ADLs Comments:  Total assist for ADL's as pt is significantly limited by ROM deficits and weaknesssecondary to RA in bil hands/UE        OT Problem List: Decreased activity tolerance;Cardiopulmonary status limiting activity;Decreased strength;Decreased range of motion      OT Treatment/Interventions: Self-care/ADL training;Therapeutic exercise;DME and/or AE instruction;Balance training;Patient/family education;Therapeutic activities    OT Goals(Current goals can be found in the care plan section) Acute Rehab OT Goals Patient Stated Goal: none stated OT Goal Formulation: With patient Time For Goal Achievement: 04/11/23 Potential to Achieve Goals: Fair  OT Frequency: Min 1X/week    Co-evaluation PT/OT/SLP Co-Evaluation/Treatment: Yes Reason for Co-Treatment: For patient/therapist safety;To address functional/ADL transfers PT goals addressed during session: Strengthening/ROM OT goals addressed during session: Strengthening/ROM;ADL's and self-care      AM-PAC OT 6 Clicks Daily Activity  Outcome Measure Help from another person eating meals?: Total Help from another person taking care of personal grooming?: Total Help from another person toileting, which includes using toliet, bedpan, or urinal?: Total Help from another person bathing (including washing, rinsing, drying)?: Total Help from another person to put on and taking off regular upper body clothing?: Total Help from another person to put on and taking off regular lower body clothing?: Total 6 Click Score: 6   End of Session Nurse Communication: Mobility status  Activity Tolerance: Patient limited by pain Patient left: in bed;with call bell/phone within reach;with bed alarm set  OT Visit Diagnosis: Muscle weakness (generalized) (M62.81)                Time: 8485-8462 OT Time Calculation (min): 23 min Charges:  OT General Charges $OT Visit: 1 Visit OT Evaluation $OT Eval Moderate Complexity: 1 Mod  Elma JONETTA Lebron FREDERICK, OTR/L [facility]  Acute Rehabilitation Office: [phone]   Elma JONETTA Lebron 03/28/2023, 5:55 PM

## 2023-03-28 NOTE — Progress Notes (Signed)
 On reassessment this afternoon he remains off vasopressors.  He is more awake and has started speaking.  His speech is somewhat dysarthric but appropriate. He is on room air.  He is appropriate for transfer.  Fredia Alderton, MD [facility] ICU Physician [facility]  Pager: [phone] Or Epic Secure Chat After hours: [phone].  03/28/2023, 2:40 PM

## 2023-03-29 DIAGNOSIS — E43 Unspecified severe protein-calorie malnutrition: Secondary | ICD-10-CM

## 2023-03-29 DIAGNOSIS — R6521 Severe sepsis with septic shock: Secondary | ICD-10-CM | POA: Diagnosis not present

## 2023-03-29 DIAGNOSIS — Z66 Do not resuscitate: Secondary | ICD-10-CM | POA: Diagnosis not present

## 2023-03-29 DIAGNOSIS — A419 Sepsis, unspecified organism: Secondary | ICD-10-CM | POA: Diagnosis not present

## 2023-03-29 LAB — COMPREHENSIVE METABOLIC PANEL
ALT: 28 U/L (ref 0–44)
AST: 39 U/L (ref 15–41)
Albumin: 2.2 g/dL — ABNORMAL LOW (ref 3.5–5.0)
Alkaline Phosphatase: 55 U/L (ref 38–126)
Anion gap: 14 (ref 5–15)
BUN: 43 mg/dL — ABNORMAL HIGH (ref 8–23)
CO2: 20 mmol/L — ABNORMAL LOW (ref 22–32)
Calcium: 7.2 mg/dL — ABNORMAL LOW (ref 8.9–10.3)
Chloride: 113 mmol/L — ABNORMAL HIGH (ref 98–111)
Creatinine, Ser: 1.44 mg/dL — ABNORMAL HIGH (ref 0.61–1.24)
GFR, Estimated: 46 mL/min — ABNORMAL LOW (ref >60)
Glucose, Bld: 108 mg/dL — ABNORMAL HIGH (ref 70–99)
Potassium: 2.8 mmol/L — ABNORMAL LOW (ref 3.5–5.1)
Sodium: 147 mmol/L — ABNORMAL HIGH (ref 135–145)
Total Bilirubin: 1.3 mg/dL — ABNORMAL HIGH (ref 0.0–1.2)
Total Protein: 5.2 g/dL — ABNORMAL LOW (ref 6.5–8.1)

## 2023-03-29 LAB — CULTURE, BLOOD (ROUTINE X 2): Special Requests: ADEQUATE

## 2023-03-29 LAB — GLUCOSE, CAPILLARY
Glucose-Capillary: 111 mg/dL — ABNORMAL HIGH (ref 70–99)
Glucose-Capillary: 96 mg/dL (ref 70–99)
Glucose-Capillary: 99 mg/dL (ref 70–99)
Glucose-Capillary: 98 mg/dL (ref 70–99)
Glucose-Capillary: 138 mg/dL — ABNORMAL HIGH (ref 70–99)
Glucose-Capillary: 113 mg/dL — ABNORMAL HIGH (ref 70–99)

## 2023-03-29 LAB — BASIC METABOLIC PANEL
Anion gap: 17 — ABNORMAL HIGH (ref 5–15)
BUN: 42 mg/dL — ABNORMAL HIGH (ref 8–23)
CO2: 22 mmol/L (ref 22–32)
Calcium: 7.5 mg/dL — ABNORMAL LOW (ref 8.9–10.3)
Chloride: 110 mmol/L (ref 98–111)
Creatinine, Ser: 1.4 mg/dL — ABNORMAL HIGH (ref 0.61–1.24)
GFR, Estimated: 48 mL/min — ABNORMAL LOW (ref >60)
Glucose, Bld: 105 mg/dL — ABNORMAL HIGH (ref 70–99)
Potassium: 2.8 mmol/L — ABNORMAL LOW (ref 3.5–5.1)
Sodium: 149 mmol/L — ABNORMAL HIGH (ref 135–145)

## 2023-03-29 LAB — MAGNESIUM: Magnesium: 1.4 mg/dL — ABNORMAL LOW (ref 1.7–2.4)

## 2023-03-29 LAB — CBC
HCT: 37.2 % — ABNORMAL LOW (ref 39.0–52.0)
Hemoglobin: 11.9 g/dL — ABNORMAL LOW (ref 13.0–17.0)
MCH: 29.6 pg (ref 26.0–34.0)
MCHC: 32 g/dL (ref 30.0–36.0)
MCV: 92.5 fL (ref 80.0–100.0)
Platelets: 255 10*3/uL (ref 150–400)
RBC: 4.02 MIL/uL — ABNORMAL LOW (ref 4.22–5.81)
RDW: 16.2 % — ABNORMAL HIGH (ref 11.5–15.5)
WBC: 12.3 10*3/uL — ABNORMAL HIGH (ref 4.0–10.5)
nRBC: 0.7 % — ABNORMAL HIGH (ref 0.0–0.2)

## 2023-03-29 LAB — CBC WITH DIFFERENTIAL/PLATELET
Abs Immature Granulocytes: 0.11 10*3/uL — ABNORMAL HIGH (ref 0.00–0.07)
Basophils Absolute: 0 10*3/uL (ref 0.0–0.1)
Basophils Relative: 0 %
Eosinophils Absolute: 0 10*3/uL (ref 0.0–0.5)
Eosinophils Relative: 0 %
HCT: 36.8 % — ABNORMAL LOW (ref 39.0–52.0)
Hemoglobin: 11.8 g/dL — ABNORMAL LOW (ref 13.0–17.0)
Immature Granulocytes: 1 %
Lymphocytes Relative: 15 %
Lymphs Abs: 1.8 10*3/uL (ref 0.7–4.0)
MCH: 29.8 pg (ref 26.0–34.0)
MCHC: 32.1 g/dL (ref 30.0–36.0)
MCV: 92.9 fL (ref 80.0–100.0)
Monocytes Absolute: 0.6 10*3/uL (ref 0.1–1.0)
Monocytes Relative: 5 %
Neutro Abs: 9.5 10*3/uL — ABNORMAL HIGH (ref 1.7–7.7)
Neutrophils Relative %: 79 %
Platelets: 244 10*3/uL (ref 150–400)
RBC: 3.96 MIL/uL — ABNORMAL LOW (ref 4.22–5.81)
RDW: 16.1 % — ABNORMAL HIGH (ref 11.5–15.5)
WBC: 12 10*3/uL — ABNORMAL HIGH (ref 4.0–10.5)
nRBC: 0.5 % — ABNORMAL HIGH (ref 0.0–0.2)

## 2023-03-29 LAB — PHOSPHORUS: Phosphorus: 2 mg/dL — ABNORMAL LOW (ref 2.5–4.6)

## 2023-03-29 MED ORDER — ADULT MULTIVITAMIN W/MINERALS CH
1.0000 | ORAL_TABLET | Freq: Every day | ORAL | Status: DC
Start: 2023-03-29 — End: 2023-03-31
  Administered 2023-03-29 – 2023-03-30 (×2): 1 via ORAL
  Filled 2023-03-29 (×2): qty 1

## 2023-03-29 MED ORDER — FENTANYL CITRATE PF 50 MCG/ML IJ SOSY
12.5000 ug | PREFILLED_SYRINGE | INTRAMUSCULAR | Status: AC | PRN
  Administered 2023-03-29 – 2023-03-30 (×3): 50 ug via INTRAVENOUS
  Filled 2023-03-29 (×3): qty 1

## 2023-03-29 MED ORDER — POTASSIUM PHOSPHATES 15 MMOLE/5ML IV SOLN
20.0000 mmol | Freq: Once | INTRAVENOUS | Status: AC
  Administered 2023-03-30: 20 mmol via INTRAVENOUS
  Filled 2023-03-29: qty 6.67

## 2023-03-29 MED ORDER — MAGNESIUM SULFATE 4 GM/100ML IV SOLN
4.0000 g | Freq: Once | INTRAVENOUS | Status: AC
  Administered 2023-03-29: 4 g via INTRAVENOUS
  Filled 2023-03-29: qty 100

## 2023-03-29 MED ORDER — POTASSIUM CHLORIDE 10 MEQ/100ML IV SOLN
10.0000 meq | INTRAVENOUS | Status: AC
  Administered 2023-03-29 (×3): 10 meq via INTRAVENOUS
  Filled 2023-03-29 (×2): qty 100

## 2023-03-29 MED ORDER — DEXTROSE 5 % IV SOLN: INTRAVENOUS | Status: AC

## 2023-03-29 NOTE — Progress Notes (Signed)
 Physical Therapy Note  New PT order acknowledged. Patient evaluated by PT and OT services 2/5 recommended LTACH. Requires total assist, no volitional activity facilitated by patient during assessment. Will follow during admission for acute needs. If plan for home with hospice, they typically take over DME needs. Please secure chat [facility] PT or myself for any specific needs/concerns at this time.  Leontine Roads, PT, DPT [facility]  Rehabilitation Services Physical Therapist Office: [phone] Website: [email or web address]

## 2023-03-29 NOTE — Progress Notes (Signed)
 [facility] 2W29 [facility] Liaison Note   [employer] had previously received a hospice referral for this patient prior to hospitalization.   We will continue to follow for discharge disposition.   Please call with any hospice related questions.   Thank you, Eleanor Nail, [facility] Liaison [phone]

## 2023-03-29 NOTE — Progress Notes (Signed)
 Speech Language Pathology Treatment: Dysphagia  Patient Details Name: William Mullins MRN: [id number] DOB: [birth year] Today's Date: 03/29/2023 Time: 8584-8574 SLP Time Calculation (min) (ACUTE ONLY): 10 min  Assessment / Plan / Recommendation Clinical Impression  PO diet was ordered for pt previous date following SLP recommendations that he remain NPO. RN reports concern for coughing with eating and drinking. Pt presents with decreased responsiveness today. Observed with thin liquids and purees, which resulted in an audible swallow immediately followed by coughing and a wet vocal quality. Concern for aspiration persists, although note pt's family declined MBS previous date. Unable to reach pt's son via phone today. Recommend pt be made NPO except ice chips after oral care. Priority meds may be given crushed in puree. Discussed with MD, who is in agreement. SLP will f/u as able to complete MBS pending GOC.   The purpose of ice for this pt is not only comfort, but to keep pharynx healthy and moist. Pooled secretions in an NPO pt can thicken and create harmful persistent secretions that pt can't mobilize. These can even form mucus plugs. A few ice chips, given after oral care, can loosen secretions for pt to clear and maintain airway hygiene. Also can preserve some swallow function. Minimal ice is not expected to be harmful to patient even if some moisture is aspirated. Cued coughs after trials also will help airway protection. Encourage RN to continue these.    HPI HPI: William Mullins is an 89 yo male presenting to ED 2/3 with acute respiratory failure, reduced responsiveness, and diaphoresis. Admitted with septic shock exacerbated by hypovolemia and AKI. CXR shows low lung volumes with patchy atelectasis at the L base. CTH with possible L occipital hypodensity suspicious for acute to subacute infarct. MRI Brain pending improvement in mental status. PMH includes A-fib, CKD, HTN, prior CVA      SLP Plan   Continue with current plan of care      Recommendations for follow up therapy are one component of a multi-disciplinary discharge planning process, led by the attending physician.  Recommendations may be updated based on patient status, additional functional criteria and insurance authorization.    Recommendations  Diet recommendations: NPO Medication Administration: Crushed with puree                  Oral care QID;Oral care prior to ice chip/H20;Staff/trained caregiver to provide oral care   Frequent or constant Supervision/Assistance Dysphagia, unspecified (R13.10)     Continue with current plan of care     Damien Blumenthal, M.A., CF-SLP Speech Language Pathology, Acute Rehabilitation Services  Secure Chat preferred [phone]   03/29/2023, 2:44 PM

## 2023-03-29 NOTE — Progress Notes (Signed)
 PROGRESS NOTE    William [facility]ins  FMW:[id number] DOB: 06/06/1931 DOA: 03/26/2023 PCP: Cleotilde Planas, MD   Brief Narrative:  William [facility]ins is an 89 y/o gentleman with a history of Afib, CKD, HTN who presented with acute respiratory failure, reduced responsiveness, low oxygen saturations, shallow respirations, and diaphoresis from [facility]. Temp 103.3 when he arrived in the ED. Per his son he has been more lethargic, sleeping more, eating minimally for the past several days. At baseline he gets OOB to [facility] a few hours per day, but his son is worried about his poor QOL. He is on chronic oxycodone  for arthritis pain. He has been minimally responsive in the ED. After volume resuscitation he continued to have low BPs and was started on peripheral vasopressors   Significant [facility] Events: Including procedures, antibiotic start and stop dates in addition to other pertinent events   2/3 started zosyn , vanc, norepi  He was able to be weaned off of pressors and was more awake and alert per [facility] and transferred to [facility] service on 03/29/2023.  Prior to transfer he failed a swallow screen and apparently got a diet for some reason but has been made n.p.o. again.  Palliative care has been consulted for goals of care discussion given the patient's poor prognosis and frail age.  Assessment and Plan:  Combination of septic and hypovolemic shock.  Source unknown but likely Aspiration -Continuing IV vancomycin  and Zosyn  for now -Is a poor candidate for long-term feeding tube which would significantly impact his quality of life -Unfortunately failed his oral swallow screen and was made n.p.o. and will continue to keep him n.p.o. -WBC Trend: Recent Labs  Lab 03/26/23 2035 03/27/23 0605 03/27/23 0617 03/28/23 0543 03/29/23 0737 03/29/23 0922  WBC 13.6* 17.2* 17.8* 18.0* 12.3* 12.0*  -Need to monitor respiratory status carefully -Palliative care consulted for further goals of care discussion  AKI,  improving  -BUN/Cr Trend: Recent Labs  Lab 03/26/23 2035 03/27/23 0617 03/28/23 0543 03/29/23 0737 03/29/23 0922  BUN 59* 58* 45* 42* 43*  CREATININE 2.30* 2.10* 1.47* 1.40* 1.44*  -Resumed IV fluids with D5W given his hypernatremia -Avoid Nephrotoxic Medications, Contrast Dyes, Hypotension and Dehydration to Ensure Adequate Renal Perfusion and will need to Renally Adjust Meds -Continue to Monitor and Trend Renal Function carefully and repeat CMP in the AM   Possible L occipital acute vs subacute infarct on CT head. -  Hypokalemia -Patient's K+ Level Trend: Recent Labs  Lab 03/26/23 2035 03/27/23 0617 03/28/23 0543 03/29/23 0737 03/29/23 0922  K 4.8 3.9 3.6 2.8* 2.8*  -Replete with IV KCL 40 mEQ and IV K Phos  20 mmol -Continue to Monitor and Replete as Necessary -Repeat CMP in the AM   Hypophosphatemia -Phos Level Trend: Recent Labs  Lab 03/29/23 0922  PHOS 2.0*  -Replete with IV K Phos  20 mmol -Continue to Monitor and Replete as Necessary and Repeat CMP within 1 week  Hypomagnesemia -Patient's Mag Level Trend: Recent Labs  Lab 03/26/23 2035 03/29/23 0922  MG 1.8 1.4*  -Replete with IV Mag Sulfate 4 grams -Continue to Monitor and Replete as Necessary -Repeat Mag in the AM   Hypernatremia -Na+ Trend: Recent Labs  Lab 03/26/23 2035 03/27/23 0617 03/28/23 0543 03/29/23 0737 03/29/23 0922  NA 154* 152* 148* 149* 147*  -Continuing D5W -Continue to Monitor and Trend and repeat CMP in the AM  Severe protein energy malnutrition Nutrition Status: Nutrition Problem: Severe Malnutrition Etiology: chronic illness Signs/Symptoms: severe muscle depletion, severe  fat depletion Interventions: Ensure Enlive (each supplement provides 350kcal and 20 grams of protein), MVI, Magic cup  Hyperbilirubinemia -Bilirubin Trend: Recent Labs  Lab 03/26/23 2035 03/29/23 0922  BILITOT 1.1 1.3*  -Continue to Monitor and Trend and Repeat CMP in the AM  Normocytic  Anemia -Hgb/Hct Trend: Recent Labs  Lab 03/26/23 2035 03/27/23 0605 03/27/23 0617 03/28/23 0543 03/29/23 0737 03/29/23 0922  HGB 14.8 13.0 13.4 12.7* 11.9* 11.8*  HCT 48.3 43.9 44.6 41.2 37.2* 36.8*  MCV 97.0 99.5 100.5* 97.6 92.5 92.9  -Check Anemia Panel in the AM -Continue to Monitor for S/Sx of Bleeding; No overt bleeding noted -Repeat CBC in the AM   Hypoalbuminemia -Patient's Albumin  Trend: Recent Labs  Lab 03/26/23 2035 03/29/23 0922  ALBUMIN  3.1* 2.2*  -Continue to Monitor and Trend and repeat CMP in the AM   DVT prophylaxis: heparin  injection 5,000 Units Start: 03/27/23 0600 SCDs Start: 03/27/23 0016    Code Status: Limited: Do not attempt resuscitation (DNR) -DNR-LIMITED -Do Not Intubate/DNI  Family Communication: No family present at bedside   Disposition Plan:  Level of care: Med-Surg Status is: Inpatient Remains inpatient appropriate because: His further clinical improvement and further goals of care discussion   Consultants:  [facility] transfer Palliative care medicine  Procedures:  As delineated as above  Antimicrobials:  Anti-infectives (From admission, onward)    Start     Dose/Rate Route Frequency Ordered Stop   03/28/23 1200  piperacillin -tazobactam (ZOSYN ) IVPB 3.375 g        3.375 g 12.5 mL/hr over 240 Minutes Intravenous Every 8 hours 03/28/23 0939 03/30/23 2359   03/28/23 1045  vancomycin  (VANCOCIN ) 750 mg in sodium chloride  0.9 % 250 mL IVPB        750 mg 250 mL/hr over 60 Minutes Intravenous Every 24 hours 03/28/23 0953 03/30/23 2359   03/28/23 1030  vancomycin  (VANCOREADY) IVPB 750 mg/150 mL  Status:  Discontinued        750 mg 150 mL/hr over 60 Minutes Intravenous Every 24 hours 03/28/23 0939 03/28/23 0953   03/27/23 0600  piperacillin -tazobactam (ZOSYN ) IVPB 2.25 g  Status:  Discontinued        2.25 g 100 mL/hr over 30 Minutes Intravenous Every 6 hours 03/27/23 0035 03/28/23 0939   03/27/23 0034  vancomycin  variable dose per unstable  renal function (pharmacist dosing)  Status:  Discontinued         Does not apply See admin instructions 03/27/23 0035 03/28/23 0939   03/26/23 2100  vancomycin  (VANCOREADY) IVPB 1500 mg/300 mL        1,500 mg 150 mL/hr over 120 Minutes Intravenous  Once 03/26/23 2046 03/26/23 2310   03/26/23 2100  piperacillin -tazobactam (ZOSYN ) IVPB 3.375 g        3.375 g 100 mL/hr over 30 Minutes Intravenous  Once 03/26/23 2046 03/26/23 2134   03/26/23 2045  vancomycin  (VANCOCIN ) IVPB 1000 mg/200 mL premix  Status:  Discontinued        1,000 mg 200 mL/hr over 60 Minutes Intravenous  Once 03/26/23 2041 03/26/23 2108   03/26/23 2045  piperacillin -tazobactam (ZOSYN ) IVPB 4.5 g  Status:  Discontinued        4.5 g 200 mL/hr over 30 Minutes Intravenous  Once 03/26/23 2041 03/26/23 2046       Subjective: Seen and examined at bedside he is dysarthric and difficult to understand.  Denied any complaints of pain.  Was mumbling.  Failed his swallow screen so was NPO.  Has a very  poor prognosis so we will consult palliative care medicine.  Objective: Vitals:   03/29/23 0407 03/29/23 0753 03/29/23 1436 03/29/23 1954  BP:  103/61 103/75 130/81  Pulse:  93 90 82  Resp:  16 16 20   Temp:  98.3 F (36.8 C)  (!) 97.1 F (36.2 C)  TempSrc:      SpO2:      Weight: 70.5 kg     Height:        Intake/Output Summary (Last 24 hours) at 03/29/2023 2009 Last data filed at 03/29/2023 0630 Gross per 24 hour  Intake --  Output 200 ml  Net -200 ml   Filed Weights   03/28/23 0500 03/29/23 0334 03/29/23 0407  Weight: 67.7 kg 70.5 kg 70.5 kg   Examination: Physical Exam:  Constitutional: Elderly frail chronically ill-appearing Caucasian male with difficult to understand mumbling Respiratory: Diminished to auscultation bilaterally, no wheezing, rales, rhonchi or crackles. Normal respiratory effort and patient is not tachypenic. No accessory muscle use.  Unlabored breathing Cardiovascular: RRR, no murmurs / rubs /  gallops. S1 and S2 auscultated. No extremity edema.   Abdomen: Soft, non-tender, mildly distended secondary to body habitus. Bowel sounds positive.  GU: Deferred. Musculoskeletal: No clubbing / cyanosis of digits/nails. No joint deformity upper and lower extremities. Skin: No rashes, lesions, ulcers limited skin evaluation. No induration; Warm and dry.  Neurologic: Dysarthric and difficult to understand and does not really follow commands Psychiatric: Impaired judgment and insight.  He is awake and alert but not fully oriented  Data Reviewed: I have personally reviewed following labs and imaging studies  CBC: Recent Labs  Lab 03/26/23 2035 03/27/23 0605 03/27/23 0617 03/28/23 0543 03/29/23 0737 03/29/23 0922  WBC 13.6* 17.2* 17.8* 18.0* 12.3* 12.0*  NEUTROABS 10.6*  --   --   --   --  9.5*  HGB 14.8 13.0 13.4 12.7* 11.9* 11.8*  HCT 48.3 43.9 44.6 41.2 37.2* 36.8*  MCV 97.0 99.5 100.5* 97.6 92.5 92.9  PLT 302 270 257 247 255 244   Basic Metabolic Panel: Recent Labs  Lab 03/26/23 2035 03/27/23 0617 03/28/23 0543 03/29/23 0737 03/29/23 0922  NA 154* 152* 148* 149* 147*  K 4.8 3.9 3.6 2.8* 2.8*  CL 109 112* 110 110 113*  CO2 27 27 22 22  20*  GLUCOSE 160* 141* 122* 105* 108*  BUN 59* 58* 45* 42* 43*  CREATININE 2.30* 2.10* 1.47* 1.40* 1.44*  CALCIUM 8.3* 7.6* 7.5* 7.5* 7.2*  MG 1.8  --   --   --  1.4*  PHOS  --   --   --   --  2.0*   GFR: Estimated Creatinine Clearance: 29.1 mL/min (A) (by C-G formula based on SCr of 1.44 mg/dL (H)). Liver Function Tests: Recent Labs  Lab 03/26/23 2035 03/29/23 0922  AST 102* 39  ALT 42 28  ALKPHOS 70 55  BILITOT 1.1 1.3*  PROT 7.3 5.2*  ALBUMIN  3.1* 2.2*   No results for input(s): LIPASE, AMYLASE in the last 168 hours. Recent Labs  Lab 03/28/23 1032  AMMONIA 24   Coagulation Profile: Recent Labs  Lab 03/26/23 2035  INR 1.6*   Cardiac Enzymes: No results for input(s): CKTOTAL, CKMB, CKMBINDEX, TROPONINI  in the last 168 hours. BNP (last 3 results) No results for input(s): PROBNP in the last 8760 hours. HbA1C: Recent Labs    03/27/23 0617  HGBA1C 5.6   CBG: Recent Labs  Lab 03/29/23 0340 03/29/23 0756 03/29/23 1146 03/29/23 1543 03/29/23 1952  GLUCAP 96 99 98 138* 113*   Lipid Profile: No results for input(s): CHOL, HDL, LDLCALC, TRIG, CHOLHDL, LDLDIRECT in the last 72 hours. Thyroid  Function Tests: No results for input(s): TSH, T4TOTAL, FREET4, T3FREE, THYROIDAB in the last 72 hours. Anemia Panel: No results for input(s): VITAMINB12, FOLATE, FERRITIN, TIBC, IRON, RETICCTPCT in the last 72 hours. Sepsis Labs: Recent Labs  Lab 03/26/23 2052 03/26/23 2243 03/28/23 0543  PROCALCITON  --   --  0.38  LATICACIDVEN 3.4* 1.6  --     Recent Results (from the past 240 hours)  Culture, blood (Routine x 2)     Status: Abnormal   Collection Time: 03/26/23  8:35 PM   Specimen: BLOOD  Result Value Ref Range Status   Specimen Description BLOOD LEFT ANTECUBITAL  Final   Special Requests   Final    BOTTLES DRAWN AEROBIC AND ANAEROBIC Blood Culture adequate volume   Culture  Setup Time   Final    GRAM POSITIVE COCCI IN CLUSTERS IN BOTH AEROBIC AND ANAEROBIC BOTTLES CRITICAL RESULT CALLED TO, READ BACK BY AND VERIFIED WITH: PHARMD TWEE DANG ON 03/27/23 @ 1808 BY DRT    Culture (A)  Final    STAPHYLOCOCCUS HOMINIS THE SIGNIFICANCE OF ISOLATING THIS ORGANISM FROM A SINGLE SET OF BLOOD CULTURES WHEN MULTIPLE SETS ARE DRAWN IS UNCERTAIN. PLEASE NOTIFY THE MICROBIOLOGY DEPARTMENT WITHIN ONE WEEK IF SPECIATION AND SENSITIVITIES ARE REQUIRED. Performed at [facility], [address], [place], [place] [address]    Report Status 03/29/2023 FINAL  Final  Blood Culture ID Panel (Reflexed)     Status: Abnormal   Collection Time: 03/26/23  8:35 PM  Result Value Ref Range Status   Enterococcus faecalis NOT DETECTED NOT DETECTED Final   Enterococcus  Faecium NOT DETECTED NOT DETECTED Final   Listeria monocytogenes NOT DETECTED NOT DETECTED Final   Staphylococcus species DETECTED (A) NOT DETECTED Final    Comment: CRITICAL RESULT CALLED TO, READ BACK BY AND VERIFIED WITH: PHARMD TWEE DANG ON 03/27/23 @ 1808 BY DRT    Staphylococcus aureus (BCID) NOT DETECTED NOT DETECTED Final   Staphylococcus epidermidis NOT DETECTED NOT DETECTED Final   Staphylococcus lugdunensis NOT DETECTED NOT DETECTED Final   Streptococcus species NOT DETECTED NOT DETECTED Final   Streptococcus agalactiae NOT DETECTED NOT DETECTED Final   Streptococcus pneumoniae NOT DETECTED NOT DETECTED Final   Streptococcus pyogenes NOT DETECTED NOT DETECTED Final   A.calcoaceticus-baumannii NOT DETECTED NOT DETECTED Final   Bacteroides fragilis NOT DETECTED NOT DETECTED Final   Enterobacterales NOT DETECTED NOT DETECTED Final   Enterobacter cloacae complex NOT DETECTED NOT DETECTED Final   Escherichia coli NOT DETECTED NOT DETECTED Final   Klebsiella aerogenes NOT DETECTED NOT DETECTED Final   Klebsiella oxytoca NOT DETECTED NOT DETECTED Final   Klebsiella pneumoniae NOT DETECTED NOT DETECTED Final   Proteus species NOT DETECTED NOT DETECTED Final   Salmonella species NOT DETECTED NOT DETECTED Final   Serratia marcescens NOT DETECTED NOT DETECTED Final   Haemophilus influenzae NOT DETECTED NOT DETECTED Final   Neisseria meningitidis NOT DETECTED NOT DETECTED Final   Pseudomonas aeruginosa NOT DETECTED NOT DETECTED Final   Stenotrophomonas maltophilia NOT DETECTED NOT DETECTED Final   Candida albicans NOT DETECTED NOT DETECTED Final   Candida auris NOT DETECTED NOT DETECTED Final   Candida glabrata NOT DETECTED NOT DETECTED Final   Candida krusei NOT DETECTED NOT DETECTED Final   Candida parapsilosis NOT DETECTED NOT DETECTED Final   Candida tropicalis NOT  DETECTED NOT DETECTED Final   Cryptococcus neoformans/gattii NOT DETECTED NOT DETECTED Final    Comment: Performed at  [facility], [address] [place], [place] [address]  Resp panel by RT-PCR (RSV, Flu A&B, Covid) Anterior Nasal Swab     Status: None   Collection Time: 03/26/23  8:40 PM   Specimen: Anterior Nasal Swab  Result Value Ref Range Status   SARS Coronavirus 2 by RT PCR NEGATIVE NEGATIVE Final   Influenza A by PCR NEGATIVE NEGATIVE Final   Influenza B by PCR NEGATIVE NEGATIVE Final    Comment: (NOTE) The Xpert Xpress SARS-CoV-2/FLU/RSV plus assay is intended as an aid in the diagnosis of influenza from Nasopharyngeal swab specimens and should not be used as a sole basis for treatment. Nasal washings and aspirates are unacceptable for Xpert Xpress SARS-CoV-2/FLU/RSV testing.  Fact Sheet for Patients: bloggercourse.com  Fact Sheet for Healthcare Providers: seriousbroker.it  This test is not yet approved or cleared by the [place]  FDA and has been authorized for detection and/or diagnosis of SARS-CoV-2 by FDA under an Emergency Use Authorization (EUA). This EUA will remain in effect (meaning this test can be used) for the duration of the COVID-19 declaration under Section 564(b)(1) of the Act, 21 U.S.C. section 360bbb-3(b)(1), unless the authorization is terminated or revoked.     Resp Syncytial Virus by PCR NEGATIVE NEGATIVE Final    Comment: (NOTE) Fact Sheet for Patients: bloggercourse.com  Fact Sheet for Healthcare Providers: seriousbroker.it  This test is not yet approved or cleared by the [place]  FDA and has been authorized for detection and/or diagnosis of SARS-CoV-2 by FDA under an Emergency Use Authorization (EUA). This EUA will remain in effect (meaning this test can be used) for the duration of the COVID-19 declaration under Section 564(b)(1) of the Act, 21 U.S.C. section 360bbb-3(b)(1), unless the authorization is terminated or revoked.  Performed at  [facility], [address] [place], [place] [address]   Respiratory (~20 pathogens) panel by PCR     Status: None   Collection Time: 03/26/23  8:40 PM   Specimen: Nasopharyngeal Swab; Respiratory  Result Value Ref Range Status   Adenovirus NOT DETECTED NOT DETECTED Final   Coronavirus 229E NOT DETECTED NOT DETECTED Final    Comment: (NOTE) The Coronavirus on the Respiratory Panel, DOES NOT test for the novel  Coronavirus (2019 nCoV)    Coronavirus HKU1 NOT DETECTED NOT DETECTED Final   Coronavirus NL63 NOT DETECTED NOT DETECTED Final   Coronavirus OC43 NOT DETECTED NOT DETECTED Final   Metapneumovirus NOT DETECTED NOT DETECTED Final   Rhinovirus / Enterovirus NOT DETECTED NOT DETECTED Final   Influenza A NOT DETECTED NOT DETECTED Final   Influenza B NOT DETECTED NOT DETECTED Final   Parainfluenza Virus 1 NOT DETECTED NOT DETECTED Final   Parainfluenza Virus 2 NOT DETECTED NOT DETECTED Final   Parainfluenza Virus 3 NOT DETECTED NOT DETECTED Final   Parainfluenza Virus 4 NOT DETECTED NOT DETECTED Final   Respiratory Syncytial Virus NOT DETECTED NOT DETECTED Final   Bordetella pertussis NOT DETECTED NOT DETECTED Final   Bordetella Parapertussis NOT DETECTED NOT DETECTED Final   Chlamydophila pneumoniae NOT DETECTED NOT DETECTED Final   Mycoplasma pneumoniae NOT DETECTED NOT DETECTED Final    Comment: Performed at [facility], [address], [place], [place] [address]  Culture, blood (Routine x 2)     Status: None (Preliminary result)   Collection Time: 03/26/23  8:42 PM  Specimen: BLOOD RIGHT HAND  Result Value Ref Range Status   Specimen Description BLOOD RIGHT HAND  Final   Special Requests   Final    BOTTLES DRAWN AEROBIC AND ANAEROBIC Blood Culture adequate volume   Culture   Final    NO GROWTH 3 DAYS Performed at [facility], [address], [place], [place] [address]    Report Status PENDING  Incomplete  MRSA Next Gen by PCR, Nasal     Status:  Abnormal   Collection Time: 03/27/23  1:00 AM   Specimen: Nasal Mucosa; Nasal Swab  Result Value Ref Range Status   MRSA by PCR Next Gen DETECTED (A) NOT DETECTED Final    Comment: RESULT CALLED TO, READ BACK BY AND VERIFIED WITH: B SIMPSON,RN@0436  03/27/23 MK (NOTE) The GeneXpert MRSA Assay (FDA approved for NASAL specimens only), is one component of a comprehensive MRSA colonization surveillance program. It is not intended to diagnose MRSA infection nor to guide or monitor treatment for MRSA infections. Test performance is not FDA approved in patients less than 36 years old. Performed at [facility], [address] [place], [place] [address]     Radiology Studies: No results found.  Scheduled Meds:  Chlorhexidine  Gluconate Cloth  6 each Topical Daily   feeding supplement  237 mL Oral BID BM   heparin   5,000 Units Subcutaneous Q8H   insulin  aspart  0-9 Units Subcutaneous Q4H   multivitamin with minerals  1 tablet Oral Daily   mupirocin  ointment  1 Application Nasal BID   mouth rinse  15 mL Mouth Rinse 4 times per day   Continuous Infusions:  dextrose      magnesium  sulfate bolus IVPB     piperacillin -tazobactam 3.375 g (03/29/23 1212)   potassium chloride      potassium PHOSPHATE  IVPB (in mmol)     vancomycin  750 mg (03/29/23 0909)    LOS: 2 days   Alejandro Marker, DO [facility] Available via Epic secure chat 7am-7pm After these hours, please refer to coverage provider listed on [email or web address] 03/29/2023, 8:09 PM

## 2023-03-29 NOTE — Hospital Course (Addendum)
 [name] is an 89 y/o gentleman with a history of Afib, CKD, HTN who presented with acute respiratory failure, reduced responsiveness, low oxygen saturations, shallow respirations, and diaphoresis from [facility]. Temp 103.3 when he arrived in the ED. Per his son he has been more lethargic, sleeping more, eating minimally for the past several days. At baseline he gets OOB to [facility] a few hours per day, but his son is worried about his poor QOL. He is on chronic oxycodone  for arthritis pain. He has been minimally responsive in the ED. After volume resuscitation he continued to have low BPs and was started on peripheral vasopressors   Significant Hospital Events: Including procedures, antibiotic start and stop dates in addition to other pertinent events   2/3 started zosyn , vanc, norepi  He was able to be weaned off of pressors and was more awake and alert per PCCM and transferred to [facility] service on 03/29/2023.  Prior to transfer he failed a swallow screen and apparently got a diet for some reason but has been made n.p.o. again and it is unsafe for him to swallow at this time so he has been initiated on D5W at 75 mL/h.  Palliative care has been consulted for goals of care discussion given the patient's poor prognosis and frail age.  Per my discussion with the patient's son today he would like to try and keep the patient comfortable and wants to speak with the palliative care team and will need to have a meeting set up.  As of now he is okay with continuing antibiotics and fluids but does not want to pursue a stroke workup.  After further goals of care discussion the patient's son has elected for the patient to be transition to comfort care and the patient's son would like his father to return to go for medical SNF with [facility].  All medications not in the patient's comfort have been discontinued and the hospice liaison has been notified and can be discharged once the facility is  able to accept him back.  After discussion with the [facility] the patient can be discharged back to his facility with hospice evaluating him tomorrow.  Assessment and Plan:  Combination of septic and hypovolemic shock.  Source unknown but likely Aspiration -Continuing IV vancomycin  and Zosyn  for now -Is a poor candidate for long-term feeding tube which would significantly impact his quality of life -Unfortunately failed his oral swallow screen and was made n.p.o. and will continue to keep him n.p.o. -WBC Trend: Recent Labs  Lab 03/27/23 0605 03/27/23 0617 03/28/23 0543 03/29/23 0737 03/29/23 0922 03/30/23 0720 03/31/23 0611  WBC 17.2* 17.8* 18.0* 12.3* 12.0* 12.0* 11.8*  -Need to monitor respiratory status carefully -CXR done and showed Mild left basilar subsegmental atelectasis is noted with possible small left pleural effusion. -Palliative care consulted for further goals of care discussion and family is likely leaning towards comfort as my discussion with the patient's son is that they want to keep him comfortable.  After further goals of care discussion patient is being transitioned to full comfort measures and he is stable to discharge back to his facility today with hospice evaluating him tomorrow  AKI, improving  -BUN/Cr Trend: Recent Labs  Lab 03/26/23 2035 03/27/23 0617 03/28/23 0543 03/29/23 0737 03/29/23 0922 03/30/23 0720 03/31/23 0611  BUN 59* 58* 45* 42* 43* 32* 24*  CREATININE 2.30* 2.10* 1.47* 1.40* 1.44* 1.24 1.00  -Resumed IV fluids with D5W given his hypernatremia and he is improved  and fluids are supposed to stop later this evening. -Avoid Nephrotoxic Medications, Contrast Dyes, Hypotension and Dehydration to Ensure Adequate Renal Perfusion and will need to Renally Adjust Meds -Will not continue to monitor and repeat CMP given the patient is being transitioned to comfort care  Possible L occipital acute vs subacute infarct on CT head. -Head CT done and  showed Left occipital hypodensity suspicious for acute to subacute infarct. Age indeterminate infarct in the right cerebellum. No acute intracranial hemorrhage. Atrophy and chronic small vessel ischemic changes of the white matter. -I spoke with neurology Dr. Jerri about this finding and he recommended pursuing a full stroke workup if so desired; I discussed with the son about further stroke workup including MRI, CTA of the head and neck as well as echocardiogram and he defers stroke workup at this time   Hypokalemia -Patient's K+ Level Trend: Recent Labs  Lab 03/26/23 2035 03/27/23 0617 03/28/23 0543 03/29/23 0737 03/29/23 0922 03/30/23 0720 03/31/23 0611  K 4.8 3.9 3.6 2.8* 2.8* 3.3* 3.3*  -Replete with IV KCL 40 mEQ  -Continue to Monitor and Replete as Necessary -Will not continue to monitor and repeat CMP given the patient is being transitioned to comfort care  Hypophosphatemia -Phos Level Trend: Recent Labs  Lab 03/29/23 0922 03/30/23 0720 03/31/23 0611  PHOS 2.0* 3.2 2.4*  -Replete with IV K Phos  20 mmol -Will not continue to monitor and repeat Phos given the patient is being transitioned to comfort care  Hypomagnesemia -Patient's Mag Level Trend: Recent Labs  Lab 03/26/23 2035 03/29/23 0922 03/30/23 0720 03/31/23 0611  MG 1.8 1.4* 2.2 1.8  -Replete with IV Mag Sulfate 4 grams -Continue to Monitor and Replete as Necessary -Will not continue to monitor and repeat Mag given the patient is being transitioned to comfort care  Hypernatremia, improved -Na+ Trend: Recent Labs  Lab 03/26/23 2035 03/27/23 0617 03/28/23 0543 03/29/23 0737 03/29/23 0922 03/30/23 0720 03/31/23 0611  NA 154* 152* 148* 149* 147* 142 140  -Continuing D5W at 75 mL/h for 1 day and is supposed to stop today -Will not continue to monitor and repeat CMP given the patient is being transitioned to comfort care  Severe protein energy malnutrition Nutrition Status: Nutrition Problem: Severe  Malnutrition Etiology: chronic illness Signs/Symptoms: severe muscle depletion, severe fat depletion Interventions: Ensure Enlive (each supplement provides 350kcal and 20 grams of protein), MVI, Magic cup  Hyperbilirubinemia -Bilirubin Trend: Recent Labs  Lab 03/26/23 2035 03/29/23 0922 03/30/23 0720 03/31/23 0611  BILITOT 1.1 1.3* 0.7 1.0  -Continue to Monitor and Trend and Repeat CMP in the AM  Normocytic Anemia -Hgb/Hct Trend: Recent Labs  Lab 03/27/23 0605 03/27/23 0617 03/28/23 0543 03/29/23 0737 03/29/23 0922 03/30/23 0720 03/31/23 0611  HGB 13.0 13.4 12.7* 11.9* 11.8* 10.9* 12.2*  HCT 43.9 44.6 41.2 37.2* 36.8* 33.6* 37.8*  MCV 99.5 100.5* 97.6 92.5 92.9 93.1 92.2  -Will not Check Anemia Panel in the AM -Continue to Monitor for S/Sx of Bleeding; No overt bleeding noted -Will not continue to monitor and repeat CBC given the patient is being transitioned to comfort care  Hypoalbuminemia -Patient's Albumin  Trend: Recent Labs  Lab 03/26/23 2035 03/29/23 0922 03/30/23 0720 03/31/23 0611  ALBUMIN  3.1* 2.2* 2.1* 2.2*  -Will not continue to monitor and repeat CMP given the patient is being transitioned to comfort care  Severe Protein Calorie Malnutrition Nutrition Status: Nutrition Problem: Severe Malnutrition Etiology: chronic illness Signs/Symptoms: severe muscle depletion, severe fat depletion Interventions: Ensure Enlive (each supplement provides  350kcal and 20 grams of protein), MVI, Magic cup  Pressure Ulcer, poA -I am in agreement with the Nursing documentation for Pressure ulcer Pressure Injury 03/27/23 Coccyx Bilateral Stage 2 -  Partial thickness loss of dermis presenting as a shallow open injury with a red, pink wound bed without slough. Pnkish and Whitish in color, Stage II Pressure Injury (Active)  03/27/23 0045  Location: Coccyx  Location Orientation: Bilateral  Staging: Stage 2 -  Partial thickness loss of dermis presenting as a shallow open  injury with a red, pink wound bed without slough.  Wound Description (Comments): Pnkish and Whitish in color, Stage II Pressure Injury  Present on Admission: Yes   **After the palliative meeting with the family yesterday patient has been transitioned to full COMFORT MEASURES and patient will be transitioned back to his facility with hospice.  Currently all medications not the patient's comfort have been discontinued and will continue fentanyl  as needed for pain, air hunger and comfort, Robinul  as needed for excessive secretions, Ativan  as needed for N/V diet and agitation, Zofran  as needed for nausea, Liquifilm Tears as needed for dry eyes, Haldol  as needed for anxiety and agitation.  Patient can have comfort feeding and have unrestricted visitation in the setting of end-of-life per the policy and will continue oxygen 2 L as needed or less for comfort no further escalation.

## 2023-03-29 NOTE — Plan of Care (Signed)
 Pt pt alert x1-2, pt npo, stage 2 on sacrum, plan is pallative consult

## 2023-03-29 NOTE — Progress Notes (Signed)
 Initial Nutrition Assessment  DOCUMENTATION CODES:   Severe malnutrition in context of chronic illness  INTERVENTION:  Continue with meal assistance Continue with Ensure Plus High Protein po BID, each supplement provides 350 kcal and 20 grams of protein. Magic cup TID with meals, each supplement provides 290 kcal and 9 grams of protein Multivitamin with minerals   NUTRITION DIAGNOSIS:   Severe Malnutrition related to chronic illness as evidenced by severe muscle depletion, severe fat depletion.    GOAL:   Patient will meet greater than or equal to 90% of their needs    MONITOR:   PO intake  REASON FOR ASSESSMENT:   Malnutrition Screening Tool    ASSESSMENT:  89 y.o. M,  Presented to ED via EMS, [facility] with acute respiratory failure, reduced responsiveness, low oxygen saturations, shallow respirations and diaphoresis. Admitting with septic shock. PMH: DM, A-fib, CKD, HTN, CVA . Son at bedside.  Patient would open his eyes and then mumble something to RD, winced in pain frequently. Poor historian unable to obtain good nutritional history.  Son did report weight loss and declined intake.  Patient needs assistance with meals.  Breakfast tray at bedside untouched. Has orders for Ensure.  RN reported that son was feeding patient ice cream upon entry and has not started working on tray.   Admit weight: 71.3 kg Weight history: 03/29/23 70.5 kg  12/02/20 103.9 kg   Hospital weight history: 03/29/23 0407 70.5 kg 155.42 lbs  03/29/23 03:34:58 70.5 kg 155.42 lbs  03/28/23 0500 67.7 kg 149.25 lbs  03/27/23 0315 63.8 kg 140.65 lbs  03/26/23 2035 71.4 kg 157.41 lbs      Average Meal Intake:   Nutritionally Relevant Medications: Scheduled Meds:  feeding supplement  237 mL Oral BID BM    Labs Reviewed:       Component Ref Range & Units (hover) 07:37 (03/29/23) 1 d ago (03/28/23) 2 d ago (03/27/23) 3 d ago (03/26/23)  Sodium 149 High  148 High  152 High   154 High   Potassium 2.8 Low  3.6 3.9 4.8     CBG ranges from 105-122 mg/dL over the last 24 hours    NUTRITION - FOCUSED PHYSICAL EXAM:  Flowsheet Row Most Recent Value  Orbital Region Severe depletion  Upper Arm Region Severe depletion  Thoracic and Lumbar Region Moderate depletion  Buccal Region Severe depletion  Temple Region Severe depletion  Clavicle Bone Region Severe depletion  Clavicle and Acromion Bone Region Moderate depletion  Scapular Bone Region Unable to assess  Dorsal Hand Unable to assess  Patellar Region Severe depletion  Anterior Thigh Region Severe depletion  Posterior Calf Region Severe depletion  Edema (RD Assessment) None  Hair Reviewed  Eyes Reviewed  Mouth Reviewed  Skin Reviewed  Nails Reviewed       Diet Order:   Diet Order             DIET - DYS 1 Room service appropriate? No; Fluid consistency: Thin  Diet effective now                   EDUCATION NEEDS:   Not appropriate for education at this time  Skin:  Skin Assessment: Skin Integrity Issues: Skin Integrity Issues:: Stage II Stage II: Bilateral Coccyx  Last BM:  285/25 (Type 7)  Height:   Ht Readings from Last 1 Encounters:  03/26/23 5' 4 (1.626 m)    Weight:   Wt Readings from Last 1 Encounters:  03/29/23 70.5 kg    Ideal Body Weight:     BMI:  Body mass index is 26.68 kg/m.  Estimated Nutritional Needs:   Kcal:  7874-7524 kcal  Protein:  95-115 g  Fluid:  67ml/kcal    Jenna Pew RDN, LDN Clinical Dietitian   If unable to reach, please contact RD Inpatient secure chat group between 8 am-4 pm daily

## 2023-03-30 ENCOUNTER — Inpatient Hospital Stay (HOSPITAL_COMMUNITY): Payer: Medicare Other

## 2023-03-30 DIAGNOSIS — A419 Sepsis, unspecified organism: Secondary | ICD-10-CM | POA: Diagnosis not present

## 2023-03-30 DIAGNOSIS — E43 Unspecified severe protein-calorie malnutrition: Secondary | ICD-10-CM | POA: Diagnosis not present

## 2023-03-30 DIAGNOSIS — Z66 Do not resuscitate: Secondary | ICD-10-CM | POA: Diagnosis not present

## 2023-03-30 DIAGNOSIS — R6521 Severe sepsis with septic shock: Secondary | ICD-10-CM | POA: Diagnosis not present

## 2023-03-30 LAB — GLUCOSE, CAPILLARY
Glucose-Capillary: 112 mg/dL — ABNORMAL HIGH (ref 70–99)
Glucose-Capillary: 116 mg/dL — ABNORMAL HIGH (ref 70–99)
Glucose-Capillary: 124 mg/dL — ABNORMAL HIGH (ref 70–99)
Glucose-Capillary: 148 mg/dL — ABNORMAL HIGH (ref 70–99)
Glucose-Capillary: 188 mg/dL — ABNORMAL HIGH (ref 70–99)
Glucose-Capillary: 188 mg/dL — ABNORMAL HIGH (ref 70–99)

## 2023-03-30 LAB — CBC WITH DIFFERENTIAL/PLATELET
Abs Immature Granulocytes: 0.11 10*3/uL — ABNORMAL HIGH (ref 0.00–0.07)
Basophils Absolute: 0 10*3/uL (ref 0.0–0.1)
Basophils Relative: 0 %
Eosinophils Absolute: 0.1 10*3/uL (ref 0.0–0.5)
Eosinophils Relative: 1 %
HCT: 33.6 % — ABNORMAL LOW (ref 39.0–52.0)
Hemoglobin: 10.9 g/dL — ABNORMAL LOW (ref 13.0–17.0)
Immature Granulocytes: 1 %
Lymphocytes Relative: 17 %
Lymphs Abs: 2.1 10*3/uL (ref 0.7–4.0)
MCH: 30.2 pg (ref 26.0–34.0)
MCHC: 32.4 g/dL (ref 30.0–36.0)
MCV: 93.1 fL (ref 80.0–100.0)
Monocytes Absolute: 0.6 10*3/uL (ref 0.1–1.0)
Monocytes Relative: 5 %
Neutro Abs: 9.2 10*3/uL — ABNORMAL HIGH (ref 1.7–7.7)
Neutrophils Relative %: 76 %
Platelets: 222 10*3/uL (ref 150–400)
RBC: 3.61 MIL/uL — ABNORMAL LOW (ref 4.22–5.81)
RDW: 16.3 % — ABNORMAL HIGH (ref 11.5–15.5)
WBC: 12 10*3/uL — ABNORMAL HIGH (ref 4.0–10.5)
nRBC: 0.3 % — ABNORMAL HIGH (ref 0.0–0.2)

## 2023-03-30 LAB — COMPREHENSIVE METABOLIC PANEL
ALT: 25 U/L (ref 0–44)
AST: 32 U/L (ref 15–41)
Albumin: 2.1 g/dL — ABNORMAL LOW (ref 3.5–5.0)
Alkaline Phosphatase: 51 U/L (ref 38–126)
Anion gap: 8 (ref 5–15)
BUN: 32 mg/dL — ABNORMAL HIGH (ref 8–23)
CO2: 24 mmol/L (ref 22–32)
Calcium: 7 mg/dL — ABNORMAL LOW (ref 8.9–10.3)
Chloride: 110 mmol/L (ref 98–111)
Creatinine, Ser: 1.24 mg/dL (ref 0.61–1.24)
GFR, Estimated: 56 mL/min — ABNORMAL LOW (ref >60)
Glucose, Bld: 199 mg/dL — ABNORMAL HIGH (ref 70–99)
Potassium: 3.3 mmol/L — ABNORMAL LOW (ref 3.5–5.1)
Sodium: 142 mmol/L (ref 135–145)
Total Bilirubin: 0.7 mg/dL (ref 0.0–1.2)
Total Protein: 5 g/dL — ABNORMAL LOW (ref 6.5–8.1)

## 2023-03-30 LAB — PHOSPHORUS: Phosphorus: 3.2 mg/dL (ref 2.5–4.6)

## 2023-03-30 LAB — MAGNESIUM: Magnesium: 2.2 mg/dL (ref 1.7–2.4)

## 2023-03-30 MED ORDER — POTASSIUM CHLORIDE 10 MEQ/100ML IV SOLN
10.0000 meq | INTRAVENOUS | Status: AC
  Administered 2023-03-30 (×2): 10 meq via INTRAVENOUS
  Filled 2023-03-30 (×2): qty 100

## 2023-03-30 MED ORDER — FENTANYL CITRATE PF 50 MCG/ML IJ SOSY
12.5000 ug | PREFILLED_SYRINGE | INTRAMUSCULAR | Status: DC | PRN
  Administered 2023-03-30: 50 ug via INTRAVENOUS
  Filled 2023-03-30: qty 1

## 2023-03-30 NOTE — Progress Notes (Signed)
 Speech Language Pathology Treatment: Dysphagia  Patient Details Name: William Mullins MRN: [id number] DOB: 01-25-1932 Today's Date: 03/30/2023 Time: 8598-8586 SLP Time Calculation (min) (ACUTE ONLY): 12 min  Assessment / Plan / Recommendation Clinical Impression  Pt unable to sustain alertness for PO trials today. He presents with aversive behaviors towards attempts at oral care and presentations of ice chips or water via tspn. Unable to observe a pharyngeal swallow response this date. Provided education to pt's granddaughters regarding completion of an MBS and role of thorough oral care preceding ice chips for comfort and secretion management. Pt is not currently demonstrating readiness to participate in a swallow study or initiate a diet. Recommend he remain NPO except for 4-5 ice chips following thorough oral care when fully awake and alert. SLP will f/u.    HPI HPI: William Mullins is an 89 yo male presenting to ED 2/3 with acute respiratory failure, reduced responsiveness, and diaphoresis. Admitted with septic shock exacerbated by hypovolemia and AKI. CXR shows low lung volumes with patchy atelectasis at the L base. CTH with possible L occipital hypodensity suspicious for acute to subacute infarct. MRI Brain pending improvement in mental status. PMH includes A-fib, CKD, HTN, prior CVA      SLP Plan  Continue with current plan of care      Recommendations for follow up therapy are one component of a multi-disciplinary discharge planning process, led by the attending physician.  Recommendations may be updated based on patient status, additional functional criteria and insurance authorization.    Recommendations  Diet recommendations: NPO Medication Administration: Crushed with puree                  Oral care QID;Oral care prior to ice chip/H20;Staff/trained caregiver to provide oral care   Frequent or constant Supervision/Assistance Dysphagia, unspecified (R13.10)     Continue  with current plan of care     Damien Blumenthal, M.A., CF-SLP Speech Language Pathology, Acute Rehabilitation Services  Secure Chat preferred [phone]   03/30/2023, 2:29 PM

## 2023-03-30 NOTE — Plan of Care (Signed)

## 2023-03-30 NOTE — Progress Notes (Signed)
 PROGRESS NOTE    William Mullins  FMW:[id number] DOB: November 03, 1931 DOA: 03/26/2023 PCP: Cleotilde Planas, MD   Brief Narrative:  William Mullins is an 89 y/o gentleman with a history of Afib, CKD, HTN who presented with acute respiratory failure, reduced responsiveness, low oxygen saturations, shallow respirations, and diaphoresis from [facility]. Temp 103.3 when he arrived in the ED. Per his son he has been more lethargic, sleeping more, eating minimally for the past several days. At baseline he gets OOB to [facility] a few hours per day, but his son is worried about his poor QOL. He is on chronic oxycodone  for arthritis pain. He has been minimally responsive in the ED. After volume resuscitation he continued to have low BPs and was started on peripheral vasopressors   Significant Hospital Events: Including procedures, antibiotic start and stop dates in addition to other pertinent events   2/3 started zosyn , vanc, norepi  He was able to be weaned off of pressors and was more awake and alert per PCCM and transferred to [facility] service on 03/29/2023.  Prior to transfer he failed a swallow screen and apparently got a diet for some reason but has been made n.p.o. again and it is unsafe for him to swallow at this time so he has been initiated on D5W at 75 mL/h.  Palliative care has been consulted for goals of care discussion given the patient's poor prognosis and frail age.  Per my discussion with the patient's son today he would like to try and keep the patient comfortable and wants to speak with the palliative care team and will need to have a meeting set up.  As of now he is okay with continuing antibiotics and fluids but does not want to pursue a stroke workup.  Assessment and Plan:  Combination of septic and hypovolemic shock.  Source unknown but likely Aspiration -Continuing IV vancomycin  and Zosyn  for now -Is a poor candidate for long-term feeding tube which would significantly impact his quality of  life -Unfortunately failed his oral swallow screen and was made n.p.o. and will continue to keep him n.p.o. -WBC Trend: Recent Labs  Lab 03/26/23 2035 03/27/23 0605 03/27/23 0617 03/28/23 0543 03/29/23 0737 03/29/23 0922 03/30/23 0720  WBC 13.6* 17.2* 17.8* 18.0* 12.3* 12.0* 12.0*  -Need to monitor respiratory status carefully -CXR done and showed Mild left basilar subsegmental atelectasis is noted with possible small left pleural effusion. -Palliative care consulted for further goals of care discussion and family is likely leaning towards comfort as my discussion with the patient's son is that they want to keep him comfortable  AKI, improving  -BUN/Cr Trend: Recent Labs  Lab 03/26/23 2035 03/27/23 0617 03/28/23 0543 03/29/23 0737 03/29/23 0922 03/30/23 0720  BUN 59* 58* 45* 42* 43* 32*  CREATININE 2.30* 2.10* 1.47* 1.40* 1.44* 1.24  -Resumed IV fluids with D5W given his hypernatremia and he is improved and fluids are supposed to stop later this evening. -Avoid Nephrotoxic Medications, Contrast Dyes, Hypotension and Dehydration to Ensure Adequate Renal Perfusion and will need to Renally Adjust Meds -Continue to Monitor and Trend Renal Function carefully and repeat CMP in the AM   Possible L occipital acute vs subacute infarct on CT head. -Head CT done and showed Left occipital hypodensity suspicious for acute to subacute infarct. Age indeterminate infarct in the right cerebellum. No acute intracranial hemorrhage. Atrophy and chronic small vessel ischemic changes of the white matter. -I spoke with neurology Dr. Jerri about this finding and he recommended  pursuing a full stroke workup if so desired; I discussed with the son about further stroke workup including MRI, CTA of the head and neck as well as echocardiogram and he defers stroke workup at this time   Hypokalemia -Patient's K+ Level Trend: Recent Labs  Lab 03/26/23 2035 03/27/23 0617 03/28/23 0543 03/29/23 0737  03/29/23 0922 03/30/23 0720  K 4.8 3.9 3.6 2.8* 2.8* 3.3*  -Replete with IV KCL 40 mEQ  -Continue to Monitor and Replete as Necessary -Repeat CMP in the AM   Hypophosphatemia -Phos Level Trend: Recent Labs  Lab 03/29/23 0922 03/30/23 0720  PHOS 2.0* 3.2  -Replete with IV K Phos  20 mmol -Continue to Monitor and Replete as Necessary and Repeat CMP within 1 week  Hypomagnesemia -Patient's Mag Level Trend: Recent Labs  Lab 03/26/23 2035 03/29/23 0922 03/30/23 0720  MG 1.8 1.4* 2.2  -Replete with IV Mag Sulfate 4 grams -Continue to Monitor and Replete as Necessary -Repeat Mag in the AM   Hypernatremia, improved -Na+ Trend: Recent Labs  Lab 03/26/23 2035 03/27/23 0617 03/28/23 0543 03/29/23 0737 03/29/23 0922 03/30/23 0720  NA 154* 152* 148* 149* 147* 142  -Continuing D5W at 75 mL/h for 1 day and is supposed to stop today -Continue to Monitor and Trend and repeat CMP in the AM  Severe protein energy malnutrition Nutrition Status: Nutrition Problem: Severe Malnutrition Etiology: chronic illness Signs/Symptoms: severe muscle depletion, severe fat depletion Interventions: Ensure Enlive (each supplement provides 350kcal and 20 grams of protein), MVI, Magic cup  Hyperbilirubinemia -Bilirubin Trend: Recent Labs  Lab 03/26/23 2035 03/29/23 0922 03/30/23 0720  BILITOT 1.1 1.3* 0.7  -Continue to Monitor and Trend and Repeat CMP in the AM  Normocytic Anemia -Hgb/Hct Trend: Recent Labs  Lab 03/26/23 2035 03/27/23 0605 03/27/23 0617 03/28/23 0543 03/29/23 0737 03/29/23 0922 03/30/23 0720  HGB 14.8 13.0 13.4 12.7* 11.9* 11.8* 10.9*  HCT 48.3 43.9 44.6 41.2 37.2* 36.8* 33.6*  MCV 97.0 99.5 100.5* 97.6 92.5 92.9 93.1  -Check Anemia Panel in the AM -Continue to Monitor for S/Sx of Bleeding; No overt bleeding noted -Repeat CBC in the AM   Hypoalbuminemia -Patient's Albumin  Trend: Recent Labs  Lab 03/26/23 2035 03/29/23 0922 03/30/23 0720  ALBUMIN  3.1*  2.2* 2.1*  -Continue to Monitor and Trend and repeat CMP in the AM   DVT prophylaxis: heparin  injection 5,000 Units Start: 03/27/23 0600 SCDs Start: 03/27/23 0016    Code Status: Limited: Do not attempt resuscitation (DNR) -DNR-LIMITED -Do Not Intubate/DNI  Family Communication: Discussed with the patient's son over the telephone  Disposition Plan:  Level of care: Med-Surg Status is: Inpatient Remains inpatient appropriate because: Needs further clinical improvement and goals of care discussion   Consultants:  PCCM transfer Palliative care medicine  Procedures:  As delineated as above  Antimicrobials:  Anti-infectives (From admission, onward)    Start     Dose/Rate Route Frequency Ordered Stop   03/28/23 1200  piperacillin -tazobactam (ZOSYN ) IVPB 3.375 g        3.375 g 12.5 mL/hr over 240 Minutes Intravenous Every 8 hours 03/28/23 0939 03/30/23 2359   03/28/23 1045  vancomycin  (VANCOCIN ) 750 mg in sodium chloride  0.9 % 250 mL IVPB        750 mg 250 mL/hr over 60 Minutes Intravenous Every 24 hours 03/28/23 0953 03/30/23 1114   03/28/23 1030  vancomycin  (VANCOREADY) IVPB 750 mg/150 mL  Status:  Discontinued        750 mg 150 mL/hr over 60 Minutes  Intravenous Every 24 hours 03/28/23 0939 03/28/23 0953   03/27/23 0600  piperacillin -tazobactam (ZOSYN ) IVPB 2.25 g  Status:  Discontinued        2.25 g 100 mL/hr over 30 Minutes Intravenous Every 6 hours 03/27/23 0035 03/28/23 0939   03/27/23 0034  vancomycin  variable dose per unstable renal function (pharmacist dosing)  Status:  Discontinued         Does not apply See admin instructions 03/27/23 0035 03/28/23 0939   03/26/23 2100  vancomycin  (VANCOREADY) IVPB 1500 mg/300 mL        1,500 mg 150 mL/hr over 120 Minutes Intravenous  Once 03/26/23 2046 03/26/23 2310   03/26/23 2100  piperacillin -tazobactam (ZOSYN ) IVPB 3.375 g        3.375 g 100 mL/hr over 30 Minutes Intravenous  Once 03/26/23 2046 03/26/23 2134   03/26/23 2045   vancomycin  (VANCOCIN ) IVPB 1000 mg/200 mL premix  Status:  Discontinued        1,000 mg 200 mL/hr over 60 Minutes Intravenous  Once 03/26/23 2041 03/26/23 2108   03/26/23 2045  piperacillin -tazobactam (ZOSYN ) IVPB 4.5 g  Status:  Discontinued        4.5 g 200 mL/hr over 30 Minutes Intravenous  Once 03/26/23 2041 03/26/23 2046       Subjective: Seen and examined at bedside and continues to be dysarthric and difficult to understand.  Continues to mumble and continues to not passed his swallow screen.  Has a very poor prognosis and palliative care has been consulted and attempted to have a meeting with the patient's son but this is yet to happen.  Per my discussion with the patient's son he is like leaning towards more comfort focused.  Objective: Vitals:   03/29/23 1954 03/30/23 0503 03/30/23 0858 03/30/23 1700  BP: 130/81 107/76 116/86 104/70  Pulse: 82 68  70  Resp: 20 18 18 17   Temp: (!) 97.1 F (36.2 C) (!) 97.5 F (36.4 C)    TempSrc:      SpO2:   97% 98%  Weight:  70.5 kg    Height:        Intake/Output Summary (Last 24 hours) at 03/30/2023 1837 Last data filed at 03/30/2023 9490 Gross per 24 hour  Intake 930.48 ml  Output 300 ml  Net 630.48 ml   Filed Weights   03/29/23 0334 03/29/23 0407 03/30/23 0503  Weight: 70.5 kg 70.5 kg 70.5 kg   Examination: Physical Exam:  Constitutional: Elderly frail chronically ill-appearing Caucasian male who continues to be very difficult to understand and is mumbling Respiratory: Diminished to auscultation bilaterally, no wheezing, rales, rhonchi or crackles. Normal respiratory effort and patient is not tachypenic. No accessory muscle use.  Unlabored breathing Cardiovascular: RRR, no murmurs / rubs / gallops. S1 and S2 auscultated. No extremity edema.   Abdomen: Soft, non-tender, slightly distended secondary to body habitus. Bowel sounds positive.  GU: Deferred. Musculoskeletal: No clubbing / cyanosis of digits/nails. No joint deformity  upper and lower extremities. Skin: No rashes, lesions, ulcers on limited skin evaluation. No induration; Warm and dry.  Neurologic: Was dysarthric and very difficult to understand and does not really follow commands Psychiatric: Impaired judgment and insight.  He is somnolent and drowsy but awakes and is not fully oriented  Data Reviewed: I have personally reviewed following labs and imaging studies  CBC: Recent Labs  Lab 03/26/23 2035 03/27/23 0605 03/27/23 0617 03/28/23 0543 03/29/23 0737 03/29/23 0922 03/30/23 0720  WBC 13.6*   < > 17.8*  18.0* 12.3* 12.0* 12.0*  NEUTROABS 10.6*  --   --   --   --  9.5* 9.2*  HGB 14.8   < > 13.4 12.7* 11.9* 11.8* 10.9*  HCT 48.3   < > 44.6 41.2 37.2* 36.8* 33.6*  MCV 97.0   < > 100.5* 97.6 92.5 92.9 93.1  PLT 302   < > 257 247 255 244 222   < > = values in this interval not displayed.   Basic Metabolic Panel: Recent Labs  Lab 03/26/23 2035 03/27/23 0617 03/28/23 0543 03/29/23 0737 03/29/23 0922 03/30/23 0720  NA 154* 152* 148* 149* 147* 142  K 4.8 3.9 3.6 2.8* 2.8* 3.3*  CL 109 112* 110 110 113* 110  CO2 27 27 22 22  20* 24  GLUCOSE 160* 141* 122* 105* 108* 199*  BUN 59* 58* 45* 42* 43* 32*  CREATININE 2.30* 2.10* 1.47* 1.40* 1.44* 1.24  CALCIUM 8.3* 7.6* 7.5* 7.5* 7.2* 7.0*  MG 1.8  --   --   --  1.4* 2.2  PHOS  --   --   --   --  2.0* 3.2   GFR: Estimated Creatinine Clearance: 33.8 mL/min (by C-G formula based on SCr of 1.24 mg/dL). Liver Function Tests: Recent Labs  Lab 03/26/23 2035 03/29/23 0922 03/30/23 0720  AST 102* 39 32  ALT 42 28 25  ALKPHOS 70 55 51  BILITOT 1.1 1.3* 0.7  PROT 7.3 5.2* 5.0*  ALBUMIN  3.1* 2.2* 2.1*   No results for input(s): LIPASE, AMYLASE in the last 168 hours. Recent Labs  Lab 03/28/23 1032  AMMONIA 24   Coagulation Profile: Recent Labs  Lab 03/26/23 2035  INR 1.6*   Cardiac Enzymes: No results for input(s): CKTOTAL, CKMB, CKMBINDEX, TROPONINI in the last 168  hours. BNP (last 3 results) No results for input(s): PROBNP in the last 8760 hours. HbA1C: No results for input(s): HGBA1C in the last 72 hours. CBG: Recent Labs  Lab 03/30/23 0034 03/30/23 0358 03/30/23 0856 03/30/23 1220 03/30/23 1707  GLUCAP 148* 188* 188* 124* 112*   Lipid Profile: No results for input(s): CHOL, HDL, LDLCALC, TRIG, CHOLHDL, LDLDIRECT in the last 72 hours. Thyroid  Function Tests: No results for input(s): TSH, T4TOTAL, FREET4, T3FREE, THYROIDAB in the last 72 hours. Anemia Panel: No results for input(s): VITAMINB12, FOLATE, FERRITIN, TIBC, IRON, RETICCTPCT in the last 72 hours. Sepsis Labs: Recent Labs  Lab 03/26/23 2052 03/26/23 2243 03/28/23 0543  PROCALCITON  --   --  0.38  LATICACIDVEN 3.4* 1.6  --    Recent Results (from the past 240 hours)  Culture, blood (Routine x 2)     Status: Abnormal   Collection Time: 03/26/23  8:35 PM   Specimen: BLOOD  Result Value Ref Range Status   Specimen Description BLOOD LEFT ANTECUBITAL  Final   Special Requests   Final    BOTTLES DRAWN AEROBIC AND ANAEROBIC Blood Culture adequate volume   Culture  Setup Time   Final    GRAM POSITIVE COCCI IN CLUSTERS IN BOTH AEROBIC AND ANAEROBIC BOTTLES CRITICAL RESULT CALLED TO, READ BACK BY AND VERIFIED WITH: PHARMD TWEE DANG ON 03/27/23 @ 1808 BY DRT    Culture (A)  Final    STAPHYLOCOCCUS HOMINIS THE SIGNIFICANCE OF ISOLATING THIS ORGANISM FROM A SINGLE SET OF BLOOD CULTURES WHEN MULTIPLE SETS ARE DRAWN IS UNCERTAIN. PLEASE NOTIFY THE MICROBIOLOGY DEPARTMENT WITHIN ONE WEEK IF SPECIATION AND SENSITIVITIES ARE REQUIRED. Performed at [facility], [address] [place], [place] [address]    Report Status 03/29/2023 FINAL  Final  Blood Culture ID Panel (Reflexed)     Status: Abnormal   Collection Time: 03/26/23  8:35 PM  Result Value Ref Range Status   Enterococcus faecalis NOT DETECTED NOT DETECTED Final   Enterococcus Faecium  NOT DETECTED NOT DETECTED Final   Listeria monocytogenes NOT DETECTED NOT DETECTED Final   Staphylococcus species DETECTED (A) NOT DETECTED Final    Comment: CRITICAL RESULT CALLED TO, READ BACK BY AND VERIFIED WITH: PHARMD TWEE DANG ON 03/27/23 @ 1808 BY DRT    Staphylococcus aureus (BCID) NOT DETECTED NOT DETECTED Final   Staphylococcus epidermidis NOT DETECTED NOT DETECTED Final   Staphylococcus lugdunensis NOT DETECTED NOT DETECTED Final   Streptococcus species NOT DETECTED NOT DETECTED Final   Streptococcus agalactiae NOT DETECTED NOT DETECTED Final   Streptococcus pneumoniae NOT DETECTED NOT DETECTED Final   Streptococcus pyogenes NOT DETECTED NOT DETECTED Final   A.calcoaceticus-baumannii NOT DETECTED NOT DETECTED Final   Bacteroides fragilis NOT DETECTED NOT DETECTED Final   Enterobacterales NOT DETECTED NOT DETECTED Final   Enterobacter cloacae complex NOT DETECTED NOT DETECTED Final   Escherichia coli NOT DETECTED NOT DETECTED Final   Klebsiella aerogenes NOT DETECTED NOT DETECTED Final   Klebsiella oxytoca NOT DETECTED NOT DETECTED Final   Klebsiella pneumoniae NOT DETECTED NOT DETECTED Final   Proteus species NOT DETECTED NOT DETECTED Final   Salmonella species NOT DETECTED NOT DETECTED Final   Serratia marcescens NOT DETECTED NOT DETECTED Final   Haemophilus influenzae NOT DETECTED NOT DETECTED Final   Neisseria meningitidis NOT DETECTED NOT DETECTED Final   Pseudomonas aeruginosa NOT DETECTED NOT DETECTED Final   Stenotrophomonas maltophilia NOT DETECTED NOT DETECTED Final   Candida albicans NOT DETECTED NOT DETECTED Final   Candida auris NOT DETECTED NOT DETECTED Final   Candida glabrata NOT DETECTED NOT DETECTED Final   Candida krusei NOT DETECTED NOT DETECTED Final   Candida parapsilosis NOT DETECTED NOT DETECTED Final   Candida tropicalis NOT DETECTED NOT DETECTED Final   Cryptococcus neoformans/gattii NOT DETECTED NOT DETECTED Final    Comment: Performed at [facility], [address], [place], [place] [address]  Resp panel by RT-PCR (RSV, Flu A&B, Covid) Anterior Nasal Swab     Status: None   Collection Time: 03/26/23  8:40 PM   Specimen: Anterior Nasal Swab  Result Value Ref Range Status   SARS Coronavirus 2 by RT PCR NEGATIVE NEGATIVE Final   Influenza A by PCR NEGATIVE NEGATIVE Final   Influenza B by PCR NEGATIVE NEGATIVE Final    Comment: (NOTE) The Xpert Xpress SARS-CoV-2/FLU/RSV plus assay is intended as an aid in the diagnosis of influenza from Nasopharyngeal swab specimens and should not be used as a sole basis for treatment. Nasal washings and aspirates are unacceptable for Xpert Xpress SARS-CoV-2/FLU/RSV testing.  Fact Sheet for Patients: bloggercourse.com  Fact Sheet for Healthcare Providers: seriousbroker.it  This test is not yet approved or cleared by the [place]  FDA and has been authorized for detection and/or diagnosis of SARS-CoV-2 by FDA under an Emergency Use Authorization (EUA). This EUA will remain in effect (meaning this test can be used) for the duration of the COVID-19 declaration under Section 564(b)(1) of the Act, 21 U.S.C. section 360bbb-3(b)(1), unless the authorization is terminated or revoked.     Resp Syncytial Virus by PCR NEGATIVE NEGATIVE Final    Comment: (NOTE) Fact Sheet for Patients: bloggercourse.com  Fact Sheet for  Healthcare Providers: seriousbroker.it  This test is not yet approved or cleared by the [place]  FDA and has been authorized for detection and/or diagnosis of SARS-CoV-2 by FDA under an Emergency Use Authorization (EUA). This EUA will remain in effect (meaning this test can be used) for the duration of the COVID-19 declaration under Section 564(b)(1) of the Act, 21 U.S.C. section 360bbb-3(b)(1), unless the authorization is terminated or revoked.  Performed at [facility], [address] [place], [place] [address]   Respiratory (~20 pathogens) panel by PCR     Status: None   Collection Time: 03/26/23  8:40 PM   Specimen: Nasopharyngeal Swab; Respiratory  Result Value Ref Range Status   Adenovirus NOT DETECTED NOT DETECTED Final   Coronavirus 229E NOT DETECTED NOT DETECTED Final    Comment: (NOTE) The Coronavirus on the Respiratory Panel, DOES NOT test for the novel  Coronavirus (2019 nCoV)    Coronavirus HKU1 NOT DETECTED NOT DETECTED Final   Coronavirus NL63 NOT DETECTED NOT DETECTED Final   Coronavirus OC43 NOT DETECTED NOT DETECTED Final   Metapneumovirus NOT DETECTED NOT DETECTED Final   Rhinovirus / Enterovirus NOT DETECTED NOT DETECTED Final   Influenza A NOT DETECTED NOT DETECTED Final   Influenza B NOT DETECTED NOT DETECTED Final   Parainfluenza Virus 1 NOT DETECTED NOT DETECTED Final   Parainfluenza Virus 2 NOT DETECTED NOT DETECTED Final   Parainfluenza Virus 3 NOT DETECTED NOT DETECTED Final   Parainfluenza Virus 4 NOT DETECTED NOT DETECTED Final   Respiratory Syncytial Virus NOT DETECTED NOT DETECTED Final   Bordetella pertussis NOT DETECTED NOT DETECTED Final   Bordetella Parapertussis NOT DETECTED NOT DETECTED Final   Chlamydophila pneumoniae NOT DETECTED NOT DETECTED Final   Mycoplasma pneumoniae NOT DETECTED NOT DETECTED Final    Comment: Performed at [facility] Lab, [address], [place], [place] [address]  Culture, blood (Routine x 2)     Status: None (Preliminary result)   Collection Time: 03/26/23  8:42 PM   Specimen: BLOOD RIGHT HAND  Result Value Ref Range Status   Specimen Description BLOOD RIGHT HAND  Final   Special Requests   Final    BOTTLES DRAWN AEROBIC AND ANAEROBIC Blood Culture adequate volume   Culture   Final    NO GROWTH 4 DAYS Performed at [facility], [address] [place], [place] [address]    Report Status PENDING  Incomplete  MRSA Next Gen by PCR, Nasal     Status: Abnormal    Collection Time: 03/27/23  1:00 AM   Specimen: Nasal Mucosa; Nasal Swab  Result Value Ref Range Status   MRSA by PCR Next Gen DETECTED (A) NOT DETECTED Final    Comment: RESULT CALLED TO, READ BACK BY AND VERIFIED WITH: B SIMPSON,RN@0436  03/27/23 MK (NOTE) The GeneXpert MRSA Assay (FDA approved for NASAL specimens only), is one component of a comprehensive MRSA colonization surveillance program. It is not intended to diagnose MRSA infection nor to guide or monitor treatment for MRSA infections. Test performance is not FDA approved in patients less than 46 years old. Performed at [facility], [address], [place], [place] [address]     Radiology Studies: DG CHEST PORT 1 VIEW Result Date: 03/30/2023 CLINICAL DATA:  Shortness of breath. EXAM: PORTABLE CHEST 1 VIEW COMPARISON:  March 27, 2023. FINDINGS: Stable cardiomediastinal silhouette. Able calcified granuloma seen in right lower lobe. Mild left basilar atelectasis is noted with possible small  pleural effusion. Bony thorax is unremarkable. IMPRESSION: Mild left basilar subsegmental atelectasis is noted with possible small left pleural effusion. Electronically Signed   By: Lynwood Landy Raddle M.D.   On: 03/30/2023 09:00   Scheduled Meds:  Chlorhexidine  Gluconate Cloth  6 each Topical Daily   feeding supplement  237 mL Oral BID BM   heparin   5,000 Units Subcutaneous Q8H   insulin  aspart  0-9 Units Subcutaneous Q4H   multivitamin with minerals  1 tablet Oral Daily   mupirocin  ointment  1 Application Nasal BID   mouth rinse  15 mL Mouth Rinse 4 times per day   Continuous Infusions:  dextrose  75 mL/hr at 03/30/23 1314   piperacillin -tazobactam 3.375 g (03/30/23 1304)   potassium chloride       LOS: 3 days   Alejandro Marker, DO [facility] Available via Epic secure chat 7am-7pm After these hours, please refer to coverage provider listed on [email or web address] 03/30/2023, 6:37 PM

## 2023-03-30 NOTE — Care Management Important Message (Signed)
 Important Message  Patient Details  Name: William Mullins MRN: [id number] Date of Birth: 01/27/1932   Important Message Given:  Yes - Medicare IM     Wynonia Hedges 03/30/2023, 10:23 AM

## 2023-03-30 NOTE — Progress Notes (Signed)
 OT Note: Second OT order received. Pt evaluated on 03/28/23 and is being followed.  Avanell Leigh, OTR/L Acute Rehabilitation Services Office: [phone]

## 2023-03-30 NOTE — Progress Notes (Signed)
     Referral received for William Mullins: goals of care discussion. Chart reviewed. Attempted to contact patient's son William Mullins. Unable to reach. Voicemail left with contact information given.   PMT will re-attempt to contact family at a later time/date. Detailed note and recommendations to follow once GOC has been completed.   Thank you for your referral and allowing PMT to assist in William Mullins's care.   Stephane Palin, NP Palliative Medicine Team  Team Phone # [phone]   NO CHARGE

## 2023-03-31 ENCOUNTER — Inpatient Hospital Stay (HOSPITAL_COMMUNITY): Payer: Medicare Other

## 2023-03-31 DIAGNOSIS — R6521 Severe sepsis with septic shock: Secondary | ICD-10-CM | POA: Diagnosis not present

## 2023-03-31 DIAGNOSIS — E43 Unspecified severe protein-calorie malnutrition: Secondary | ICD-10-CM | POA: Diagnosis not present

## 2023-03-31 DIAGNOSIS — A419 Sepsis, unspecified organism: Secondary | ICD-10-CM | POA: Diagnosis not present

## 2023-03-31 DIAGNOSIS — Z515 Encounter for palliative care: Secondary | ICD-10-CM | POA: Diagnosis not present

## 2023-03-31 DIAGNOSIS — Z7189 Other specified counseling: Secondary | ICD-10-CM

## 2023-03-31 DIAGNOSIS — Z66 Do not resuscitate: Secondary | ICD-10-CM | POA: Diagnosis not present

## 2023-03-31 LAB — CBC WITH DIFFERENTIAL/PLATELET
Abs Immature Granulocytes: 0.25 10*3/uL — ABNORMAL HIGH (ref 0.00–0.07)
Basophils Absolute: 0.1 10*3/uL (ref 0.0–0.1)
Basophils Relative: 0 %
Eosinophils Absolute: 0.3 10*3/uL (ref 0.0–0.5)
Eosinophils Relative: 3 %
HCT: 37.8 % — ABNORMAL LOW (ref 39.0–52.0)
Hemoglobin: 12.2 g/dL — ABNORMAL LOW (ref 13.0–17.0)
Immature Granulocytes: 2 %
Lymphocytes Relative: 15 %
Lymphs Abs: 1.7 10*3/uL (ref 0.7–4.0)
MCH: 29.8 pg (ref 26.0–34.0)
MCHC: 32.3 g/dL (ref 30.0–36.0)
MCV: 92.2 fL (ref 80.0–100.0)
Monocytes Absolute: 0.6 10*3/uL (ref 0.1–1.0)
Monocytes Relative: 5 %
Neutro Abs: 8.9 10*3/uL — ABNORMAL HIGH (ref 1.7–7.7)
Neutrophils Relative %: 75 %
Platelets: 255 10*3/uL (ref 150–400)
RBC: 4.1 MIL/uL — ABNORMAL LOW (ref 4.22–5.81)
RDW: 16.5 % — ABNORMAL HIGH (ref 11.5–15.5)
WBC: 11.8 10*3/uL — ABNORMAL HIGH (ref 4.0–10.5)
nRBC: 0.2 % (ref 0.0–0.2)

## 2023-03-31 LAB — COMPREHENSIVE METABOLIC PANEL
ALT: 24 U/L (ref 0–44)
AST: 25 U/L (ref 15–41)
Albumin: 2.2 g/dL — ABNORMAL LOW (ref 3.5–5.0)
Alkaline Phosphatase: 63 U/L (ref 38–126)
Anion gap: 9 (ref 5–15)
BUN: 24 mg/dL — ABNORMAL HIGH (ref 8–23)
CO2: 23 mmol/L (ref 22–32)
Calcium: 7.3 mg/dL — ABNORMAL LOW (ref 8.9–10.3)
Chloride: 108 mmol/L (ref 98–111)
Creatinine, Ser: 1 mg/dL (ref 0.61–1.24)
GFR, Estimated: >60 mL/min (ref >60)
Glucose, Bld: 109 mg/dL — ABNORMAL HIGH (ref 70–99)
Potassium: 3.3 mmol/L — ABNORMAL LOW (ref 3.5–5.1)
Sodium: 140 mmol/L (ref 135–145)
Total Bilirubin: 1 mg/dL (ref 0.0–1.2)
Total Protein: 5.3 g/dL — ABNORMAL LOW (ref 6.5–8.1)

## 2023-03-31 LAB — GLUCOSE, CAPILLARY
Glucose-Capillary: 100 mg/dL — ABNORMAL HIGH (ref 70–99)
Glucose-Capillary: 102 mg/dL — ABNORMAL HIGH (ref 70–99)
Glucose-Capillary: 112 mg/dL — ABNORMAL HIGH (ref 70–99)
Glucose-Capillary: 93 mg/dL (ref 70–99)

## 2023-03-31 LAB — CULTURE, BLOOD (ROUTINE X 2)
Culture: NO GROWTH
Special Requests: ADEQUATE

## 2023-03-31 LAB — MAGNESIUM: Magnesium: 1.8 mg/dL (ref 1.7–2.4)

## 2023-03-31 LAB — PHOSPHORUS: Phosphorus: 2.4 mg/dL — ABNORMAL LOW (ref 2.5–4.6)

## 2023-03-31 MED ORDER — HALOPERIDOL LACTATE 5 MG/ML IJ SOLN: 2.0000 mg | INTRAMUSCULAR | Status: DC | PRN

## 2023-03-31 MED ORDER — ONDANSETRON 4 MG PO TBDP: 4.0000 mg | ORAL_TABLET | Freq: Four times a day (QID) | ORAL | Status: DC | PRN

## 2023-03-31 MED ORDER — POTASSIUM CHLORIDE 10 MEQ/100ML IV SOLN
10.0000 meq | INTRAVENOUS | Status: DC
  Filled 2023-03-31: qty 100

## 2023-03-31 MED ORDER — HALOPERIDOL LACTATE 2 MG/ML PO CONC: 2.0000 mg | Freq: Four times a day (QID) | ORAL | Status: DC | PRN

## 2023-03-31 MED ORDER — ACETAMINOPHEN 650 MG RE SUPP: 650.0000 mg | Freq: Four times a day (QID) | RECTAL | Status: DC | PRN

## 2023-03-31 MED ORDER — GLYCOPYRROLATE 0.2 MG/ML IJ SOLN: 0.2000 mg | INTRAMUSCULAR | Status: DC | PRN

## 2023-03-31 MED ORDER — LORAZEPAM 2 MG/ML IJ SOLN: 1.0000 mg | INTRAMUSCULAR | Status: DC | PRN

## 2023-03-31 MED ORDER — POTASSIUM PHOSPHATES 15 MMOLE/5ML IV SOLN
15.0000 mmol | Freq: Once | INTRAVENOUS | Status: DC
  Filled 2023-03-31: qty 5

## 2023-03-31 MED ORDER — HALOPERIDOL 1 MG PO TABS: 2.0000 mg | ORAL_TABLET | Freq: Four times a day (QID) | ORAL | Status: DC | PRN

## 2023-03-31 MED ORDER — DIPHENHYDRAMINE HCL 50 MG/ML IJ SOLN: 25.0000 mg | INTRAMUSCULAR | Status: DC | PRN

## 2023-03-31 MED ORDER — POLYVINYL ALCOHOL 1.4 % OP SOLN: 1.0000 [drp] | Freq: Four times a day (QID) | OPHTHALMIC | Status: DC | PRN

## 2023-03-31 MED ORDER — ACETAMINOPHEN 325 MG PO TABS: 650.0000 mg | ORAL_TABLET | Freq: Four times a day (QID) | ORAL | Status: DC | PRN

## 2023-03-31 MED ORDER — MAGNESIUM SULFATE 2 GM/50ML IV SOLN
2.0000 g | Freq: Once | INTRAVENOUS | Status: DC
  Administered 2023-03-31: 2 g via INTRAVENOUS
  Filled 2023-03-31: qty 50

## 2023-03-31 MED ORDER — ONDANSETRON HCL 4 MG/2ML IJ SOLN: 4.0000 mg | Freq: Four times a day (QID) | INTRAMUSCULAR | Status: DC | PRN

## 2023-03-31 MED ORDER — FENTANYL CITRATE PF 50 MCG/ML IJ SOSY
50.0000 ug | PREFILLED_SYRINGE | INTRAMUSCULAR | Status: DC | PRN
  Administered 2023-03-31 – 2023-04-01 (×4): 50 ug via INTRAVENOUS
  Filled 2023-03-31 (×2): qty 1
  Filled 2023-03-31: qty 2

## 2023-03-31 MED ORDER — BIOTENE DRY MOUTH MT LIQD
15.0000 mL | Freq: Two times a day (BID) | OROMUCOSAL | Status: DC
  Administered 2023-03-31: 15 mL via TOPICAL

## 2023-03-31 MED ORDER — GLYCOPYRROLATE 1 MG PO TABS: 1.0000 mg | ORAL_TABLET | ORAL | Status: DC | PRN

## 2023-03-31 MED ORDER — LORAZEPAM 1 MG PO TABS: 1.0000 mg | ORAL_TABLET | ORAL | Status: DC | PRN

## 2023-03-31 MED ORDER — LORAZEPAM 2 MG/ML PO CONC: 1.0000 mg | ORAL | Status: DC | PRN

## 2023-03-31 NOTE — Progress Notes (Signed)
 [facility] 2W29 [facility] liaison note:   Plan confirmed for patient to return to [facility] with hospice to follow.   Thank you for the opportunity to participate in this patient's plan of care.  Hunter Seip, BSN, RN Hospice hospital liaison [phone]

## 2023-03-31 NOTE — Consult Note (Signed)
 Palliative Care Consult Note                                  Date: 03/31/2023   Patient Name: William Mullins  DOB: January 12, 1932  MRN: [id number]  Age / Sex: 89 y.o., male  PCP: Cleotilde Planas, MD Referring Physician: Sherrill Alejandro Donovan, DO  Reason for Consultation: Establishing goals of care  HPI/Patient Profile: 89 y.o. male  with past medical history of Afib, CKD, DM, and HTN  admitted on 03/26/2023 from [facility] with acute respiratory failure, reduced responsiveness, low oxygen saturations, shallow respirations, and diaphoresis. In the ED he had a temp of 103.3 F. He was started on antibiotics and required peripheral vasopressors. He was weaned off vasopressors. Failed swallow study and has been NPO since 03/30/2023.  Past Medical History:  Diagnosis Date   Arthritis    Diabetes mellitus without complication (HCC)    Hypertension     Subjective:   I have reviewed medical records including EPIC notes, labs and imaging, assessed the patient and then met with the patient's son Safwan Tomei to discuss diagnosis prognosis, GOC, EOL wishes, disposition and options.  I introduced Palliative Medicine as specialized medical care for people living with serious illness. It focuses on providing relief from symptoms and stress of a serious illness. The goal is to improve quality of life for both the patient and the family.   Today's Discussion: Patient's son Garrel shares that the patient, his wife of over 70 years, and one of their sons all live at [facility]. 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 They had a house full of boys (6 children total at one time). He has not been active in some time.  Garrel has a good understanding of the patient's chronic and acute conditions. He shares that patient has been suffering for some time. The patient has been living in [facility] for two years. Recently he has been more lethargic and has gone days at a time without eating. Garrel feels that the patient has little to no quality of life at this time. The family has decided it would be best to transition the patient to comfort measures. Prior to this admission they had placed a consult with [facility] for hospice services at the nursing facility. Garrel would like his father to return to [facility] with [facility] hospice services.  We discussed that once the patient was transitioned to comfort measures he would no longer receive aggressive medical interventions such as continuous vital signs, lab work, radiology testing, or medications not focused on comfort. All care would focus on how the patient is looking and feeling. This would include management of any symptoms that may cause discomfort, pain, shortness of breath, cough, nausea, agitation, anxiety, and/or secretions etc. Symptoms would be managed with medications and other non-pharmacological interventions. Garrel verbalized understanding and appreciation. He would like to start comfort measures at the hospital.  We discussed that I would let the team and [facility] liaison know of the transition to comfort measures.  Questions and concerns were addressed. The family was encouraged to call with questions or concerns. PMT will continue to support holistically.  Review of Systems  Unable to perform ROS   Objective:   Primary Diagnoses: Present on Admission:  Septic shock [facility])   Physical Exam Vitals reviewed.  Constitutional:      General: He is sleeping.     Appearance: He is ill-appearing.  HENT:     Head: Normocephalic and atraumatic.     Mouth/Throat:     Mouth: Mucous membranes are dry.  Cardiovascular:     Rate and Rhythm: Normal rate.  Pulmonary:     Effort: Pulmonary effort is normal.  Skin:    General: Skin is warm and dry.  Neurological:     Mental Status: He is lethargic.     Vital  Signs:  BP 123/77 (BP Location: Right Arm)   Pulse 72   Temp (!) 96.8 F (36 C)   Resp 17   Ht 5' 4 (1.626 m)   Wt 72 kg   SpO2 95%   BMI 27.25 kg/m   Palliative Assessment/Data: 10%    Advanced Care Planning:   Existing Vynca/ACP Documentation: None  Primary Decision Maker: NEXT OF KIN: Patient's sons. Garrel Jules.  Code Status/Advance Care Planning: DNR  Assessment & Plan:   SUMMARY OF RECOMMENDATIONS   DNR Comfort measures Back to facility with hospice- TOC and Hospice Liaison notified Continued PMT support   Comfort medications for symptom management Fentanyl  PRN for pain/air hunger/comfort Robinul  PRN for excessive secretions Ativan  PRN for agitation/anxiety Zofran  PRN for nausea Liquifilm tears PRN for dry eyes Haldol  PRN for agitation/anxiety May have comfort feeding Unrestricted visitations in the setting of EOL (per policy) Oxygen PRN 2L or less for comfort. No escalation.    Discussed with: bedside RN and Dr. Sherrill  Time Total: 75 minutes   Thank you for allowing us  to participate in the care of DEMARIAN EPPS PMT will continue to support holistically.    Signed by: Stephane Palin, NP Palliative Medicine Team  Team Phone # [phone] (Nights/Weekends)  03/31/2023, 10:06 AM

## 2023-03-31 NOTE — Plan of Care (Signed)
  Problem: Education: Goal: Ability to describe self-care measures that may prevent or decrease complications (Diabetes Survival Skills Education) will improve Outcome: Progressing Goal: Individualized Educational Video(s) Outcome: Progressing   Problem: Coping: Goal: Ability to adjust to condition or change in health will improve Outcome: Progressing   Problem: Fluid Volume: Goal: Ability to maintain a balanced intake and output will improve Outcome: Progressing   Problem: Health Behavior/Discharge Planning: Goal: Ability to identify and utilize available resources and services will improve Outcome: Progressing Goal: Ability to manage health-related needs will improve Outcome: Progressing   Problem: Metabolic: Goal: Ability to maintain appropriate glucose levels will improve Outcome: Progressing   Problem: Nutritional: Goal: Maintenance of adequate nutrition will improve Outcome: Progressing Goal: Progress toward achieving an optimal weight will improve Outcome: Progressing   Problem: Tissue Perfusion: Goal: Adequacy of tissue perfusion will improve Outcome: Progressing   Problem: Education: Goal: Knowledge of General Education information will improve Description: Including pain rating scale, medication(s)/side effects and non-pharmacologic comfort measures Outcome: Progressing   Problem: Health Behavior/Discharge Planning: Goal: Ability to manage health-related needs will improve Outcome: Progressing   Problem: Clinical Measurements: Goal: Ability to maintain clinical measurements within normal limits will improve Outcome: Progressing Goal: Will remain free from infection Outcome: Progressing Goal: Diagnostic test results will improve Outcome: Progressing Goal: Respiratory complications will improve Outcome: Progressing Goal: Cardiovascular complication will be avoided Outcome: Progressing   Problem: Coping: Goal: Level of anxiety will decrease Outcome:  Progressing   Problem: Elimination: Goal: Will not experience complications related to bowel motility Outcome: Progressing Goal: Will not experience complications related to urinary retention Outcome: Progressing   Problem: Pain Managment: Goal: General experience of comfort will improve and/or be controlled Outcome: Progressing   Problem: Safety: Goal: Ability to remain free from injury will improve Outcome: Progressing   Problem: Skin Integrity: Goal: Risk for impaired skin integrity will decrease Outcome: Progressing

## 2023-03-31 NOTE — Progress Notes (Signed)
 Speech Language Pathology Treatment: Dysphagia  Patient Details Name: William Mullins MRN: [id number] DOB: 1931-11-18 Today's Date: 03/31/2023 Time: 8994-8985 SLP Time Calculation (min) (ACUTE ONLY): 9 min  Assessment / Plan / Recommendation Clinical Impression  Pt seen for ST services to attempt PO trials. Pt encountered partially elevated in bed with son at bedside. The pt's son reports the pt is continuing to deny PO intake. The pt's eyes were open but the pt had largely incoherent speech and difficulty answering SLPs questions.The SLP attempted oral care using a moistened oral swab, the pt turned his head and attempted to bite down on the swab. The SLP put an ice chip near the pt's lip to cue oral manipulation, the pt turned his head away and said no. Further attempts for oral care or PO trials stopped. The pt's son reports discussion with medical team about transitioning to hospice care either today or tomorrow but reports is not officially in place yet. The SLP provided brief education about comfort measures and the SLP's role in rehab services. SLP to f/u with oral care, PO trials, and pt/family education if pt is not transitioned to hospice care.    HPI HPI: William Mullins is an 89 yo male presenting to ED 2/3 with acute respiratory failure, reduced responsiveness, and diaphoresis. Admitted with septic shock exacerbated by hypovolemia and AKI. CXR shows low lung volumes with patchy atelectasis at the L base. CTH with possible L occipital hypodensity suspicious for acute to subacute infarct. MRI Brain pending improvement in mental status. PMH includes A-fib, CKD, HTN, prior CVA      SLP Plan  Continue with current plan of care      Recommendations for follow up therapy are one component of a multi-disciplinary discharge planning process, led by the attending physician.  Recommendations may be updated based on patient status, additional functional criteria and insurance authorization.     Recommendations  Diet recommendations: NPO Medication Administration: Crushed with puree                  Oral care QID;Oral care prior to ice chip/H20;Staff/trained caregiver to provide oral care   Frequent or constant Supervision/Assistance Dysphagia, unspecified (R13.10)     Continue with current plan of care     Manuelita Blew M.S. CCC-SLP

## 2023-03-31 NOTE — Progress Notes (Signed)
 PROGRESS NOTE    William Mullins  FMW:[id number] DOB: 26-Nov-1931 DOA: 03/26/2023 PCP: Cleotilde Planas, MD   Brief Narrative:  [name] is an 89 y/o gentleman with a history of Afib, CKD, HTN who presented with acute respiratory failure, reduced responsiveness, low oxygen saturations, shallow respirations, and diaphoresis from [facility] Temp 103.3 when he arrived in the ED. Per his son he has been more lethargic, sleeping more, eating minimally for the past several days. At baseline he gets OOB to [facility] a few hours per day, but his son is worried about his poor QOL. He is on chronic oxycodone  for arthritis pain. He has been minimally responsive in the ED. After volume resuscitation he continued to have low BPs and was started on peripheral vasopressors   Significant Hospital Events: Including procedures, antibiotic start and stop dates in addition to other pertinent events   2/3 started zosyn , vanc, norepi  He was able to be weaned off of pressors and was more awake and alert per PCCM and transferred to [facility] service on 03/29/2023.  Prior to transfer he failed a swallow screen and apparently got a diet for some reason but has been made n.p.o. again and it is unsafe for him to swallow at this time so he has been initiated on D5W at 75 mL/h.  Palliative care has been consulted for goals of care discussion given the patient's poor prognosis and frail age.  Per my discussion with the patient's son today he would like to try and keep the patient comfortable and wants to speak with the palliative care team and will need to have a meeting set up.  As of now he is okay with continuing antibiotics and fluids but does not want to pursue a stroke workup.  After further goals of care discussion the patient's son has elected for the patient to be transition to comfort care and the patient's son would like his father to return to go for medical SNF with [facility].  All medications not in the  patient's comfort have been discontinued and the hospice liaison has been notified and can be discharged once the facility is able to accept him back.  Assessment and Plan:  Combination of septic and hypovolemic shock.  Source unknown but likely Aspiration -Continuing IV vancomycin  and Zosyn  for now -Is a poor candidate for long-term feeding tube which would significantly impact his quality of life -Unfortunately failed his oral swallow screen and was made n.p.o. and will continue to keep him n.p.o. -WBC Trend: Recent Labs  Lab 03/27/23 0605 03/27/23 0617 03/28/23 0543 03/29/23 0737 03/29/23 0922 03/30/23 0720 03/31/23 0611  WBC 17.2* 17.8* 18.0* 12.3* 12.0* 12.0* 11.8*  -Need to monitor respiratory status carefully -CXR done and showed Mild left basilar subsegmental atelectasis is noted with possible small left pleural effusion. -Palliative care consulted for further goals of care discussion and family is likely leaning towards comfort as my discussion with the patient's son is that they want to keep him comfortable.  After further goals of care discussion patient is being transitioned to full comfort measures  AKI, improving  -BUN/Cr Trend: Recent Labs  Lab 03/26/23 2035 03/27/23 0617 03/28/23 0543 03/29/23 0737 03/29/23 0922 03/30/23 0720 03/31/23 0611  BUN 59* 58* 45* 42* 43* 32* 24*  CREATININE 2.30* 2.10* 1.47* 1.40* 1.44* 1.24 1.00  -Resumed IV fluids with D5W given his hypernatremia and he is improved and fluids are supposed to stop later this evening. -Avoid Nephrotoxic Medications, Contrast  Dyes, Hypotension and Dehydration to Ensure Adequate Renal Perfusion and will need to Renally Adjust Meds -Will not continue to monitor and repeat CMP given the patient is being transitioned to comfort care  Possible L occipital acute vs subacute infarct on CT head. -Head CT done and showed Left occipital hypodensity suspicious for acute to subacute infarct. Age  indeterminate infarct in the right cerebellum. No acute intracranial hemorrhage. Atrophy and chronic small vessel ischemic changes of the white matter. -I spoke with neurology Dr. Jerri about this finding and he recommended pursuing a full stroke workup if so desired; I discussed with the son about further stroke workup including MRI, CTA of the head and neck as well as echocardiogram and he defers stroke workup at this time   Hypokalemia -Patient's K+ Level Trend: Recent Labs  Lab 03/26/23 2035 03/27/23 0617 03/28/23 0543 03/29/23 0737 03/29/23 0922 03/30/23 0720 03/31/23 0611  K 4.8 3.9 3.6 2.8* 2.8* 3.3* 3.3*  -Replete with IV KCL 40 mEQ  -Continue to Monitor and Replete as Necessary -Will not continue to monitor and repeat CMP given the patient is being transitioned to comfort care  Hypophosphatemia -Phos Level Trend: Recent Labs  Lab 03/29/23 0922 03/30/23 0720 03/31/23 0611  PHOS 2.0* 3.2 2.4*  -Replete with IV K Phos  20 mmol -Will not continue to monitor and repeat Phos given the patient is being transitioned to comfort care  Hypomagnesemia -Patient's Mag Level Trend: Recent Labs  Lab 03/26/23 2035 03/29/23 0922 03/30/23 0720 03/31/23 0611  MG 1.8 1.4* 2.2 1.8  -Replete with IV Mag Sulfate 4 grams -Continue to Monitor and Replete as Necessary -Will not continue to monitor and repeat Mag given the patient is being transitioned to comfort care  Hypernatremia, improved -Na+ Trend: Recent Labs  Lab 03/26/23 2035 03/27/23 0617 03/28/23 0543 03/29/23 0737 03/29/23 0922 03/30/23 0720 03/31/23 0611  NA 154* 152* 148* 149* 147* 142 140  -Continuing D5W at 75 mL/h for 1 day and is supposed to stop today -Will not continue to monitor and repeat CMP given the patient is being transitioned to comfort care  Severe protein energy malnutrition Nutrition Status: Nutrition Problem: Severe Malnutrition Etiology: chronic illness Signs/Symptoms: severe muscle depletion,  severe fat depletion Interventions: Ensure Enlive (each supplement provides 350kcal and 20 grams of protein), MVI, Magic cup  Hyperbilirubinemia -Bilirubin Trend: Recent Labs  Lab 03/26/23 2035 03/29/23 0922 03/30/23 0720 03/31/23 0611  BILITOT 1.1 1.3* 0.7 1.0  -Continue to Monitor and Trend and Repeat CMP in the AM  Normocytic Anemia -Hgb/Hct Trend: Recent Labs  Lab 03/27/23 0605 03/27/23 0617 03/28/23 0543 03/29/23 0737 03/29/23 0922 03/30/23 0720 03/31/23 0611  HGB 13.0 13.4 12.7* 11.9* 11.8* 10.9* 12.2*  HCT 43.9 44.6 41.2 37.2* 36.8* 33.6* 37.8*  MCV 99.5 100.5* 97.6 92.5 92.9 93.1 92.2  -Will not Check Anemia Panel in the AM -Continue to Monitor for S/Sx of Bleeding; No overt bleeding noted -Will not continue to monitor and repeat CBC given the patient is being transitioned to comfort care  Hypoalbuminemia -Patient's Albumin  Trend: Recent Labs  Lab 03/26/23 2035 03/29/23 0922 03/30/23 0720 03/31/23 0611  ALBUMIN  3.1* 2.2* 2.1* 2.2*  -Will not continue to monitor and repeat CMP given the patient is being transitioned to comfort care   After the palliative meeting with the family today patient has been transitioned to full comfort measures and patient will be transitioned back to his facility with hospice.  Currently all medications not the patient's comfort have been discontinued  and will continue fentanyl  as needed for pain, air hunger and comfort, Robinul  as needed for excessive secretions, Ativan  as needed for N/V diet and agitation, Zofran  as needed for nausea, Liquifilm Tears as needed for dry eyes, Haldol  as needed for anxiety and agitation.  Patient can have comfort feeding and have unrestricted visitation in the setting of end-of-life per the policy and will continue oxygen 2 L as needed or less for comfort no further escalation.  DVT prophylaxis: None of the patient is full comfort measures    Code Status: Do not attempt resuscitation (DNR) - Comfort  care Family Communication: Discussed with patient's son Garrel states  Disposition Plan:  Level of care: Med-Surg Status is: Inpatient Remains inpatient appropriate because: Is a high risk for decompensation now that he is comfort care and will need to be transferred back to his SNF facility with hospice   Consultants:  PCCM transfer, Palliative Care  Procedures:  As delineated as above  Antimicrobials:  Anti-infectives (From admission, onward)    Start     Dose/Rate Route Frequency Ordered Stop   03/28/23 1200  piperacillin -tazobactam (ZOSYN ) IVPB 3.375 g        3.375 g 12.5 mL/hr over 240 Minutes Intravenous Every 8 hours 03/28/23 0939 03/31/23 0358   03/28/23 1045  vancomycin  (VANCOCIN ) 750 mg in sodium chloride  0.9 % 250 mL IVPB        750 mg 250 mL/hr over 60 Minutes Intravenous Every 24 hours 03/28/23 0953 03/30/23 1930   03/28/23 1030  vancomycin  (VANCOREADY) IVPB 750 mg/150 mL  Status:  Discontinued        750 mg 150 mL/hr over 60 Minutes Intravenous Every 24 hours 03/28/23 0939 03/28/23 0953   03/27/23 0600  piperacillin -tazobactam (ZOSYN ) IVPB 2.25 g  Status:  Discontinued        2.25 g 100 mL/hr over 30 Minutes Intravenous Every 6 hours 03/27/23 0035 03/28/23 0939   03/27/23 0034  vancomycin  variable dose per unstable renal function (pharmacist dosing)  Status:  Discontinued         Does not apply See admin instructions 03/27/23 0035 03/28/23 0939   03/26/23 2100  vancomycin  (VANCOREADY) IVPB 1500 mg/300 mL        1,500 mg 150 mL/hr over 120 Minutes Intravenous  Once 03/26/23 2046 03/26/23 2310   03/26/23 2100  piperacillin -tazobactam (ZOSYN ) IVPB 3.375 g        3.375 g 100 mL/hr over 30 Minutes Intravenous  Once 03/26/23 2046 03/26/23 2134   03/26/23 2045  vancomycin  (VANCOCIN ) IVPB 1000 mg/200 mL premix  Status:  Discontinued        1,000 mg 200 mL/hr over 60 Minutes Intravenous  Once 03/26/23 2041 03/26/23 2108   03/26/23 2045  piperacillin -tazobactam (ZOSYN ) IVPB  4.5 g  Status:  Discontinued        4.5 g 200 mL/hr over 30 Minutes Intravenous  Once 03/26/23 2041 03/26/23 2046       Subjective: Seen and examined at bedside he is resting and had no complaints.  Difficult to understand what he saying.  Family has elected for comfort measures now.  Does not appear to be in any acute distress currently.  Objective: Vitals:   03/30/23 1700 03/30/23 1945 03/31/23 0500 03/31/23 0750  BP: 104/70 111/83  123/77  Pulse: 70 67  72  Resp: 17 17  17   Temp:  (!) 96.8 F (36 C)    TempSrc:      SpO2: 98% 96%  95%  Weight:   72 kg   Height:        Intake/Output Summary (Last 24 hours) at 03/31/2023 1606 Last data filed at 03/31/2023 0407 Gross per 24 hour  Intake --  Output 250 ml  Net -250 ml   Filed Weights   03/29/23 0407 03/30/23 0503 03/31/23 0500  Weight: 70.5 kg 70.5 kg 72 kg   Examination: Physical Exam:  Constitutional: Elderly frail chronically ill-appearing Caucasian male who appears calm and is very difficult to continue to understand due to him mumbling Respiratory: Diminished to auscultation bilaterally, no wheezing, rales, rhonchi or crackles. Normal respiratory effort and patient is not tachypenic. No accessory muscle use.  Unlabored breathing Cardiovascular: RRR, no murmurs / rubs / gallops. S1 and S2 auscultated. No extremity edema.   Abdomen: Soft, non-tender, slightly distended secondary to body habitus. Bowel sounds positive.  GU: Deferred. Musculoskeletal: No clubbing / cyanosis of digits/nails. No joint deformity upper and lower extremities. Skin: No rashes, lesions, ulcers on limited skin evaluation but does have a facial seborrheic keratosis on the left side of his face. No induration; Warm and dry.  Neurologic: Continues to be dysarthric and difficult to understand  Psychiatric: Impaired judgment and insight appears calm and he is awake and alert but not fully oriented.  Data Reviewed: I have personally reviewed following  labs and imaging studies  CBC: Recent Labs  Lab 03/26/23 2035 03/27/23 0605 03/28/23 0543 03/29/23 0737 03/29/23 0922 03/30/23 0720 03/31/23 0611  WBC 13.6*   < > 18.0* 12.3* 12.0* 12.0* 11.8*  NEUTROABS 10.6*  --   --   --  9.5* 9.2* 8.9*  HGB 14.8   < > 12.7* 11.9* 11.8* 10.9* 12.2*  HCT 48.3   < > 41.2 37.2* 36.8* 33.6* 37.8*  MCV 97.0   < > 97.6 92.5 92.9 93.1 92.2  PLT 302   < > 247 255 244 222 255   < > = values in this interval not displayed.   Basic Metabolic Panel: Recent Labs  Lab 03/26/23 2035 03/27/23 0617 03/28/23 0543 03/29/23 0737 03/29/23 0922 03/30/23 0720 03/31/23 0611  NA 154*   < > 148* 149* 147* 142 140  K 4.8   < > 3.6 2.8* 2.8* 3.3* 3.3*  CL 109   < > 110 110 113* 110 108  CO2 27   < > 22 22 20* 24 23  GLUCOSE 160*   < > 122* 105* 108* 199* 109*  BUN 59*   < > 45* 42* 43* 32* 24*  CREATININE 2.30*   < > 1.47* 1.40* 1.44* 1.24 1.00  CALCIUM 8.3*   < > 7.5* 7.5* 7.2* 7.0* 7.3*  MG 1.8  --   --   --  1.4* 2.2 1.8  PHOS  --   --   --   --  2.0* 3.2 2.4*   < > = values in this interval not displayed.   GFR: Estimated Creatinine Clearance: 45.5 mL/min (by C-G formula based on SCr of 1 mg/dL). Liver Function Tests: Recent Labs  Lab 03/26/23 2035 03/29/23 0922 03/30/23 0720 03/31/23 0611  AST 102* 39 32 25  ALT 42 28 25 24   ALKPHOS 70 55 51 63  BILITOT 1.1 1.3* 0.7 1.0  PROT 7.3 5.2* 5.0* 5.3*  ALBUMIN  3.1* 2.2* 2.1* 2.2*   No results for input(s): LIPASE, AMYLASE in the last 168 hours. Recent Labs  Lab 03/28/23 1032  AMMONIA 24   Coagulation Profile: Recent Labs  Lab 03/26/23  2035  INR 1.6*   Cardiac Enzymes: No results for input(s): CKTOTAL, CKMB, CKMBINDEX, TROPONINI in the last 168 hours. BNP (last 3 results) No results for input(s): PROBNP in the last 8760 hours. HbA1C: No results for input(s): HGBA1C in the last 72 hours. CBG: Recent Labs  Lab 03/30/23 1934 03/31/23 0000 03/31/23 0350 03/31/23 0753  03/31/23 0810  GLUCAP 116* 100* 93 102* 112*   Lipid Profile: No results for input(s): CHOL, HDL, LDLCALC, TRIG, CHOLHDL, LDLDIRECT in the last 72 hours. Thyroid  Function Tests: No results for input(s): TSH, T4TOTAL, FREET4, T3FREE, THYROIDAB in the last 72 hours. Anemia Panel: No results for input(s): VITAMINB12, FOLATE, FERRITIN, TIBC, IRON, RETICCTPCT in the last 72 hours. Sepsis Labs: Recent Labs  Lab 03/26/23 2052 03/26/23 2243 03/28/23 0543  PROCALCITON  --   --  0.38  LATICACIDVEN 3.4* 1.6  --     Recent Results (from the past 240 hours)  Culture, blood (Routine x 2)     Status: Abnormal   Collection Time: 03/26/23  8:35 PM   Specimen: BLOOD  Result Value Ref Range Status   Specimen Description BLOOD LEFT ANTECUBITAL  Final   Special Requests   Final    BOTTLES DRAWN AEROBIC AND ANAEROBIC Blood Culture adequate volume   Culture  Setup Time   Final    GRAM POSITIVE COCCI IN CLUSTERS IN BOTH AEROBIC AND ANAEROBIC BOTTLES CRITICAL RESULT CALLED TO, READ BACK BY AND VERIFIED WITH: PHARMD TWEE DANG ON 03/27/23 @ 1808 BY DRT    Culture (A)  Final    STAPHYLOCOCCUS HOMINIS THE SIGNIFICANCE OF ISOLATING THIS ORGANISM FROM A SINGLE SET OF BLOOD CULTURES WHEN MULTIPLE SETS ARE DRAWN IS UNCERTAIN. PLEASE NOTIFY THE MICROBIOLOGY DEPARTMENT WITHIN ONE WEEK IF SPECIATION AND SENSITIVITIES ARE REQUIRED. Performed at [facility], [address] [place], [place] [address]    Report Status 03/29/2023 FINAL  Final  Blood Culture ID Panel (Reflexed)     Status: Abnormal   Collection Time: 03/26/23  8:35 PM  Result Value Ref Range Status   Enterococcus faecalis NOT DETECTED NOT DETECTED Final   Enterococcus Faecium NOT DETECTED NOT DETECTED Final   Listeria monocytogenes NOT DETECTED NOT DETECTED Final   Staphylococcus species DETECTED (A) NOT DETECTED Final    Comment: CRITICAL RESULT CALLED TO, READ BACK BY AND VERIFIED WITH: PHARMD TWEE DANG  ON 03/27/23 @ 1808 BY DRT    Staphylococcus aureus (BCID) NOT DETECTED NOT DETECTED Final   Staphylococcus epidermidis NOT DETECTED NOT DETECTED Final   Staphylococcus lugdunensis NOT DETECTED NOT DETECTED Final   Streptococcus species NOT DETECTED NOT DETECTED Final   Streptococcus agalactiae NOT DETECTED NOT DETECTED Final   Streptococcus pneumoniae NOT DETECTED NOT DETECTED Final   Streptococcus pyogenes NOT DETECTED NOT DETECTED Final   A.calcoaceticus-baumannii NOT DETECTED NOT DETECTED Final   Bacteroides fragilis NOT DETECTED NOT DETECTED Final   Enterobacterales NOT DETECTED NOT DETECTED Final   Enterobacter cloacae complex NOT DETECTED NOT DETECTED Final   Escherichia coli NOT DETECTED NOT DETECTED Final   Klebsiella aerogenes NOT DETECTED NOT DETECTED Final   Klebsiella oxytoca NOT DETECTED NOT DETECTED Final   Klebsiella pneumoniae NOT DETECTED NOT DETECTED Final   Proteus species NOT DETECTED NOT DETECTED Final   Salmonella species NOT DETECTED NOT DETECTED Final   Serratia marcescens NOT DETECTED NOT DETECTED Final   Haemophilus influenzae NOT DETECTED NOT DETECTED Final   Neisseria meningitidis NOT DETECTED NOT DETECTED Final   Pseudomonas aeruginosa NOT  DETECTED NOT DETECTED Final   Stenotrophomonas maltophilia NOT DETECTED NOT DETECTED Final   Candida albicans NOT DETECTED NOT DETECTED Final   Candida auris NOT DETECTED NOT DETECTED Final   Candida glabrata NOT DETECTED NOT DETECTED Final   Candida krusei NOT DETECTED NOT DETECTED Final   Candida parapsilosis NOT DETECTED NOT DETECTED Final   Candida tropicalis NOT DETECTED NOT DETECTED Final   Cryptococcus neoformans/gattii NOT DETECTED NOT DETECTED Final    Comment: Performed at [facility], [address], [place], [place] [address]  Resp panel by RT-PCR (RSV, Flu A&B, Covid) Anterior Nasal Swab     Status: None   Collection Time: 03/26/23  8:40 PM   Specimen: Anterior Nasal Swab  Result Value Ref Range  Status   SARS Coronavirus 2 by RT PCR NEGATIVE NEGATIVE Final   Influenza A by PCR NEGATIVE NEGATIVE Final   Influenza B by PCR NEGATIVE NEGATIVE Final    Comment: (NOTE) The Xpert Xpress SARS-CoV-2/FLU/RSV plus assay is intended as an aid in the diagnosis of influenza from Nasopharyngeal swab specimens and should not be used as a sole basis for treatment. Nasal washings and aspirates are unacceptable for Xpert Xpress SARS-CoV-2/FLU/RSV testing.  Fact Sheet for Patients: bloggercourse.com  Fact Sheet for Healthcare Providers: seriousbroker.it  This test is not yet approved or cleared by the [place]  FDA and has been authorized for detection and/or diagnosis of SARS-CoV-2 by FDA under an Emergency Use Authorization (EUA). This EUA will remain in effect (meaning this test can be used) for the duration of the COVID-19 declaration under Section 564(b)(1) of the Act, 21 U.S.C. section 360bbb-3(b)(1), unless the authorization is terminated or revoked.     Resp Syncytial Virus by PCR NEGATIVE NEGATIVE Final    Comment: (NOTE) Fact Sheet for Patients: bloggercourse.com  Fact Sheet for Healthcare Providers: seriousbroker.it  This test is not yet approved or cleared by the [place]  FDA and has been authorized for detection and/or diagnosis of SARS-CoV-2 by FDA under an Emergency Use Authorization (EUA). This EUA will remain in effect (meaning this test can be used) for the duration of the COVID-19 declaration under Section 564(b)(1) of the Act, 21 U.S.C. section 360bbb-3(b)(1), unless the authorization is terminated or revoked.  Performed at [facility], [address] [place], [place] [address]   Respiratory (~20 pathogens) panel by PCR     Status: None   Collection Time: 03/26/23  8:40 PM   Specimen: Nasopharyngeal Swab; Respiratory  Result Value Ref Range Status    Adenovirus NOT DETECTED NOT DETECTED Final   Coronavirus 229E NOT DETECTED NOT DETECTED Final    Comment: (NOTE) The Coronavirus on the Respiratory Panel, DOES NOT test for the novel  Coronavirus (2019 nCoV)    Coronavirus HKU1 NOT DETECTED NOT DETECTED Final   Coronavirus NL63 NOT DETECTED NOT DETECTED Final   Coronavirus OC43 NOT DETECTED NOT DETECTED Final   Metapneumovirus NOT DETECTED NOT DETECTED Final   Rhinovirus / Enterovirus NOT DETECTED NOT DETECTED Final   Influenza A NOT DETECTED NOT DETECTED Final   Influenza B NOT DETECTED NOT DETECTED Final   Parainfluenza Virus 1 NOT DETECTED NOT DETECTED Final   Parainfluenza Virus 2 NOT DETECTED NOT DETECTED Final   Parainfluenza Virus 3 NOT DETECTED NOT DETECTED Final   Parainfluenza Virus 4 NOT DETECTED NOT DETECTED Final   Respiratory Syncytial Virus NOT DETECTED NOT DETECTED Final   Bordetella pertussis NOT DETECTED NOT DETECTED Final   Bordetella Parapertussis  NOT DETECTED NOT DETECTED Final   Chlamydophila pneumoniae NOT DETECTED NOT DETECTED Final   Mycoplasma pneumoniae NOT DETECTED NOT DETECTED Final    Comment: Performed at [facility], [address], [place], [place] [address]  Culture, blood (Routine x 2)     Status: None   Collection Time: 03/26/23  8:42 PM   Specimen: BLOOD RIGHT HAND  Result Value Ref Range Status   Specimen Description BLOOD RIGHT HAND  Final   Special Requests   Final    BOTTLES DRAWN AEROBIC AND ANAEROBIC Blood Culture adequate volume   Culture   Final    NO GROWTH 5 DAYS Performed at [facility], [address], [place], [place] [address]    Report Status 03/31/2023 FINAL  Final  MRSA Next Gen by PCR, Nasal     Status: Abnormal   Collection Time: 03/27/23  1:00 AM   Specimen: Nasal Mucosa; Nasal Swab  Result Value Ref Range Status   MRSA by PCR Next Gen DETECTED (A) NOT DETECTED Final    Comment: RESULT CALLED TO, READ BACK BY AND VERIFIED WITH: B SIMPSON,RN@0436  03/27/23  MK (NOTE) The GeneXpert MRSA Assay (FDA approved for NASAL specimens only), is one component of a comprehensive MRSA colonization surveillance program. It is not intended to diagnose MRSA infection nor to guide or monitor treatment for MRSA infections. Test performance is not FDA approved in patients less than 81 years old. Performed at [facility], [address] [place], [place] [address]     Radiology Studies: DG CHEST PORT 1 VIEW Result Date: 03/31/2023 CLINICAL DATA:  Shortness of breath. EXAM: PORTABLE CHEST 1 VIEW COMPARISON:  Chest x-ray from yesterday. FINDINGS: The heart size and mediastinal contours are within normal limits. Normal pulmonary vascularity. Continued low lung volumes. Unchanged small left pleural effusion with adjacent left basilar atelectasis. No pneumothorax. No acute osseous abnormality. IMPRESSION: 1. Unchanged small left pleural effusion with adjacent left basilar atelectasis. Electronically Signed   By: Elsie ONEIDA Shoulder M.D.   On: 03/31/2023 11:23   DG CHEST PORT 1 VIEW Result Date: 03/30/2023 CLINICAL DATA:  Shortness of breath. EXAM: PORTABLE CHEST 1 VIEW COMPARISON:  March 27, 2023. FINDINGS: Stable cardiomediastinal silhouette. Able calcified granuloma seen in right lower lobe. Mild left basilar atelectasis is noted with possible small pleural effusion. Bony thorax is unremarkable. IMPRESSION: Mild left basilar subsegmental atelectasis is noted with possible small left pleural effusion. Electronically Signed   By: Lynwood Landy Raddle M.D.   On: 03/30/2023 09:00   Scheduled Meds:  antiseptic oral rinse  15 mL Topical BID   Chlorhexidine  Gluconate Cloth  6 each Topical Daily   Continuous Infusions:   LOS: 4 days   Alejandro Marker, DO [facility] Available via Epic secure chat 7am-7pm After these hours, please refer to coverage provider listed on [email or web address] 03/31/2023, 4:06 PM

## 2023-04-01 DIAGNOSIS — R6521 Severe sepsis with septic shock: Secondary | ICD-10-CM | POA: Diagnosis not present

## 2023-04-01 DIAGNOSIS — Z515 Encounter for palliative care: Secondary | ICD-10-CM

## 2023-04-01 DIAGNOSIS — E43 Unspecified severe protein-calorie malnutrition: Secondary | ICD-10-CM | POA: Diagnosis not present

## 2023-04-01 DIAGNOSIS — Z7189 Other specified counseling: Secondary | ICD-10-CM | POA: Diagnosis not present

## 2023-04-01 DIAGNOSIS — A419 Sepsis, unspecified organism: Secondary | ICD-10-CM | POA: Diagnosis not present

## 2023-04-01 MED ORDER — POLYETHYLENE GLYCOL 3350 17 G PO PACK: 17.0000 g | PACK | Freq: Every day | ORAL | 0 refills | Status: AC | PRN

## 2023-04-01 MED ORDER — OXYCODONE-ACETAMINOPHEN 5-325 MG PO TABS: 1.0000 | ORAL_TABLET | Freq: Every day | ORAL | 0 refills | Status: AC

## 2023-04-01 MED ORDER — ONDANSETRON 4 MG PO TBDP: 4.0000 mg | ORAL_TABLET | Freq: Four times a day (QID) | ORAL | 0 refills | Status: AC | PRN

## 2023-04-01 MED ORDER — ACETAMINOPHEN 325 MG PO TABS: 650.0000 mg | ORAL_TABLET | Freq: Four times a day (QID) | ORAL | 0 refills | Status: AC | PRN

## 2023-04-01 MED ORDER — LORAZEPAM 1 MG PO TABS: 1.0000 mg | ORAL_TABLET | ORAL | 0 refills | Status: AC | PRN

## 2023-04-01 MED ORDER — POLYVINYL ALCOHOL 1.4 % OP SOLN: 1.0000 [drp] | Freq: Four times a day (QID) | OPHTHALMIC | 0 refills | Status: AC | PRN

## 2023-04-01 MED ORDER — GLYCOPYRROLATE 1 MG PO TABS: 1.0000 mg | ORAL_TABLET | ORAL | 0 refills | Status: AC | PRN

## 2023-04-01 MED ORDER — DOCUSATE SODIUM 100 MG PO CAPS: 100.0000 mg | ORAL_CAPSULE | Freq: Two times a day (BID) | ORAL | 0 refills | Status: AC | PRN

## 2023-04-01 MED ORDER — BIOTENE DRY MOUTH MT LIQD: 15.0000 mL | Freq: Two times a day (BID) | OROMUCOSAL | 0 refills | Status: AC

## 2023-04-01 MED ORDER — HALOPERIDOL 2 MG PO TABS: 2.0000 mg | ORAL_TABLET | Freq: Four times a day (QID) | ORAL | 0 refills | Status: AC | PRN

## 2023-04-01 NOTE — Plan of Care (Signed)
  Problem: Education: Goal: Ability to describe self-care measures that may prevent or decrease complications (Diabetes Survival Skills Education) will improve Outcome: Progressing Goal: Individualized Educational Video(s) Outcome: Progressing   Problem: Coping: Goal: Ability to adjust to condition or change in health will improve Outcome: Progressing   Problem: Health Behavior/Discharge Planning: Goal: Ability to identify and utilize available resources and services will improve Outcome: Progressing Goal: Ability to manage health-related needs will improve Outcome: Progressing   Problem: Metabolic: Goal: Ability to maintain appropriate glucose levels will improve Outcome: Progressing   Problem: Skin Integrity: Goal: Risk for impaired skin integrity will decrease Outcome: Progressing   Problem: Tissue Perfusion: Goal: Adequacy of tissue perfusion will improve Outcome: Progressing   Problem: Education: Goal: Knowledge of General Education information will improve Description: Including pain rating scale, medication(s)/side effects and non-pharmacologic comfort measures Outcome: Progressing   Problem: Clinical Measurements: Goal: Ability to maintain clinical measurements within normal limits will improve Outcome: Progressing Goal: Will remain free from infection Outcome: Progressing Goal: Diagnostic test results will improve Outcome: Progressing Goal: Respiratory complications will improve Outcome: Progressing Goal: Cardiovascular complication will be avoided Outcome: Progressing   Problem: Nutrition: Goal: Adequate nutrition will be maintained Outcome: Progressing   Problem: Coping: Goal: Level of anxiety will decrease Outcome: Progressing   Problem: Elimination: Goal: Will not experience complications related to bowel motility Outcome: Progressing Goal: Will not experience complications related to urinary retention Outcome: Progressing   Problem: Pain  Managment: Goal: General experience of comfort will improve and/or be controlled Outcome: Progressing   Problem: Skin Integrity: Goal: Risk for impaired skin integrity will decrease Outcome: Progressing   Problem: Safety: Goal: Ability to remain free from injury will improve Outcome: Progressing

## 2023-04-01 NOTE — Discharge Summary (Signed)
 Physician Discharge Summary   Patient: William Mullins MRN: [id number] DOB: Dec 05, 1931  Admit date:     03/26/2023  Discharge date: 04/01/23  Discharge Physician: Alejandro Marker, DO   PCP: Cleotilde Planas, MD   Recommendations at discharge:    Follow up Care at SNF with Hospice per their Protocol  Discharge Diagnoses: Principal Problem:   Septic shock (HCC) Active Problems:   DNR (do not resuscitate)   Pressure injury of skin   Protein-calorie malnutrition, severe   Hospice care patient   Palliative care patient  Resolved Problems:   * No resolved hospital problems. [facility] Course: William Mullins is an 89 y/o gentleman with a history of Afib, CKD, HTN who presented with acute respiratory failure, reduced responsiveness, low oxygen saturations, shallow respirations, and diaphoresis from [facility]. Temp 103.3 when he arrived in the ED. Per his son he has been more lethargic, sleeping more, eating minimally for the past several days. At baseline he gets OOB to [facility] a few hours per day, but his son is worried about his poor QOL. He is on chronic oxycodone  for arthritis pain. He has been minimally responsive in the ED. After volume resuscitation he continued to have low BPs and was started on peripheral vasopressors   Significant Hospital Events: Including procedures, antibiotic start and stop dates in addition to other pertinent events   2/3 started zosyn , vanc, norepi  He was able to be weaned off of pressors and was more awake and alert per PCCM and transferred to [facility] service on 03/29/2023.  Prior to transfer he failed a swallow screen and apparently got a diet for some reason but has been made n.p.o. again and it is unsafe for him to swallow at this time so he has been initiated on D5W at 75 mL/h.  Palliative care has been consulted for goals of care discussion given the patient's poor prognosis and frail age.  Per my discussion with the patient's son today he would like to try and  keep the patient comfortable and wants to speak with the palliative care team and will need to have a meeting set up.  As of now he is okay with continuing antibiotics and fluids but does not want to pursue a stroke workup.  After further goals of care discussion the patient's son has elected for the patient to be transition to comfort care and the patient's son would like his father to return to go for medical SNF with [facility].  All medications not in the patient's comfort have been discontinued and the hospice liaison has been notified and can be discharged once the facility is able to accept him back.  After discussion with the [facility] the patient can be discharged back to his facility with hospice evaluating him tomorrow.  Assessment and Plan:  Combination of septic and hypovolemic shock.  Source unknown but likely Aspiration -Continuing IV vancomycin  and Zosyn  for now -Is a poor candidate for long-term feeding tube which would significantly impact his quality of life -Unfortunately failed his oral swallow screen and was made n.p.o. and will continue to keep him n.p.o. -WBC Trend: Recent Labs  Lab 03/27/23 0605 03/27/23 0617 03/28/23 0543 03/29/23 0737 03/29/23 0922 03/30/23 0720 03/31/23 0611  WBC 17.2* 17.8* 18.0* 12.3* 12.0* 12.0* 11.8*  -Need to monitor respiratory status carefully -CXR done and showed Mild left basilar subsegmental atelectasis is noted with possible small left pleural effusion. -Palliative care consulted for further goals of care discussion  and family is likely leaning towards comfort as my discussion with the patient's son is that they want to keep him comfortable.  After further goals of care discussion patient is being transitioned to full comfort measures and he is stable to discharge back to his facility today with hospice evaluating him tomorrow  AKI, improving  -BUN/Cr Trend: Recent Labs  Lab 03/26/23 2035 03/27/23 0617  03/28/23 0543 03/29/23 0737 03/29/23 0922 03/30/23 0720 03/31/23 0611  BUN 59* 58* 45* 42* 43* 32* 24*  CREATININE 2.30* 2.10* 1.47* 1.40* 1.44* 1.24 1.00  -Resumed IV fluids with D5W given his hypernatremia and he is improved and fluids are supposed to stop later this evening. -Avoid Nephrotoxic Medications, Contrast Dyes, Hypotension and Dehydration to Ensure Adequate Renal Perfusion and will need to Renally Adjust Meds -Will not continue to monitor and repeat CMP given the patient is being transitioned to comfort care  Possible L occipital acute vs subacute infarct on CT head. -Head CT done and showed Left occipital hypodensity suspicious for acute to subacute infarct. Age indeterminate infarct in the right cerebellum. No acute intracranial hemorrhage. Atrophy and chronic small vessel ischemic changes of the white matter. -I spoke with neurology Dr. Jerri about this finding and he recommended pursuing a full stroke workup if so desired; I discussed with the son about further stroke workup including MRI, CTA of the head and neck as well as echocardiogram and he defers stroke workup at this time   Hypokalemia -Patient's K+ Level Trend: Recent Labs  Lab 03/26/23 2035 03/27/23 0617 03/28/23 0543 03/29/23 0737 03/29/23 0922 03/30/23 0720 03/31/23 0611  K 4.8 3.9 3.6 2.8* 2.8* 3.3* 3.3*  -Replete with IV KCL 40 mEQ  -Continue to Monitor and Replete as Necessary -Will not continue to monitor and repeat CMP given the patient is being transitioned to comfort care  Hypophosphatemia -Phos Level Trend: Recent Labs  Lab 03/29/23 0922 03/30/23 0720 03/31/23 0611  PHOS 2.0* 3.2 2.4*  -Replete with IV K Phos  20 mmol -Will not continue to monitor and repeat Phos given the patient is being transitioned to comfort care  Hypomagnesemia -Patient's Mag Level Trend: Recent Labs  Lab 03/26/23 2035 03/29/23 0922 03/30/23 0720 03/31/23 0611  MG 1.8 1.4* 2.2 1.8  -Replete with IV Mag  Sulfate 4 grams -Continue to Monitor and Replete as Necessary -Will not continue to monitor and repeat Mag given the patient is being transitioned to comfort care  Hypernatremia, improved -Na+ Trend: Recent Labs  Lab 03/26/23 2035 03/27/23 0617 03/28/23 0543 03/29/23 0737 03/29/23 0922 03/30/23 0720 03/31/23 0611  NA 154* 152* 148* 149* 147* 142 140  -Continuing D5W at 75 mL/h for 1 day and is supposed to stop today -Will not continue to monitor and repeat CMP given the patient is being transitioned to comfort care  Severe protein energy malnutrition Nutrition Status: Nutrition Problem: Severe Malnutrition Etiology: chronic illness Signs/Symptoms: severe muscle depletion, severe fat depletion Interventions: Ensure Enlive (each supplement provides 350kcal and 20 grams of protein), MVI, Magic cup  Hyperbilirubinemia -Bilirubin Trend: Recent Labs  Lab 03/26/23 2035 03/29/23 0922 03/30/23 0720 03/31/23 0611  BILITOT 1.1 1.3* 0.7 1.0  -Continue to Monitor and Trend and Repeat CMP in the AM  Normocytic Anemia -Hgb/Hct Trend: Recent Labs  Lab 03/27/23 0605 03/27/23 0617 03/28/23 0543 03/29/23 0737 03/29/23 0922 03/30/23 0720 03/31/23 0611  HGB 13.0 13.4 12.7* 11.9* 11.8* 10.9* 12.2*  HCT 43.9 44.6 41.2 37.2* 36.8* 33.6* 37.8*  MCV 99.5 100.5* 97.6 92.5  92.9 93.1 92.2  -Will not Check Anemia Panel in the AM -Continue to Monitor for S/Sx of Bleeding; No overt bleeding noted -Will not continue to monitor and repeat CBC given the patient is being transitioned to comfort care  Hypoalbuminemia -Patient's Albumin  Trend: Recent Labs  Lab 03/26/23 2035 03/29/23 0922 03/30/23 0720 03/31/23 0611  ALBUMIN  3.1* 2.2* 2.1* 2.2*  -Will not continue to monitor and repeat CMP given the patient is being transitioned to comfort care  Severe Protein Calorie Malnutrition Nutrition Status: Nutrition Problem: Severe Malnutrition Etiology: chronic illness Signs/Symptoms: severe  muscle depletion, severe fat depletion Interventions: Ensure Enlive (each supplement provides 350kcal and 20 grams of protein), MVI, Magic cup  Pressure Ulcer, poA -I am in agreement with the Nursing documentation for Pressure ulcer Pressure Injury 03/27/23 Coccyx Bilateral Stage 2 -  Partial thickness loss of dermis presenting as a shallow open injury with a red, pink wound bed without slough. Pnkish and Whitish in color, Stage II Pressure Injury (Active)  03/27/23 0045  Location: Coccyx  Location Orientation: Bilateral  Staging: Stage 2 -  Partial thickness loss of dermis presenting as a shallow open injury with a red, pink wound bed without slough.  Wound Description (Comments): Pnkish and Whitish in color, Stage II Pressure Injury  Present on Admission: Yes   **After the palliative meeting with the family yesterday patient has been transitioned to full COMFORT MEASURES and patient will be transitioned back to his facility with hospice.  Currently all medications not the patient's comfort have been discontinued and will continue fentanyl  as needed for pain, air hunger and comfort, Robinul  as needed for excessive secretions, Ativan  as needed for N/V diet and agitation, Zofran  as needed for nausea, Liquifilm Tears as needed for dry eyes, Haldol  as needed for anxiety and agitation.  Patient can have comfort feeding and have unrestricted visitation in the setting of end-of-life per the policy and will continue oxygen 2 L as needed or less for comfort no further escalation. Nutrition Documentation    Flowsheet Row ED to Hosp-Admission (Current) from 03/26/2023 in [facility]  Nutrition Problem Severe Malnutrition  Etiology chronic illness  Nutrition Goal Patient will meet greater than or equal to 90% of their needs  Interventions Ensure Enlive (each supplement provides 350kcal and 20 grams of protein), MVI, Magic cup     ,  Active Pressure Injury/Wound(s)     Pressure Ulcer   Duration          Pressure Injury 03/27/23 Coccyx Bilateral Stage 2 -  Partial thickness loss of dermis presenting as a shallow open injury with a red, pink wound bed without slough. Pnkish and Whitish in color, Stage II Pressure Injury 5 days           Consultants: PCCM Transfer, Palliative Care Medicine Procedures performed: As delineated as above  Disposition: Skilled nursing facility with Hospice  Diet recommendation:  May have Comfort Feeding  DISCHARGE MEDICATION: Allergies as of 04/01/2023   No Known Allergies      Medication List     STOP taking these medications    allopurinol 100 MG tablet Commonly known as: ZYLOPRIM   amLODipine 5 MG tablet Commonly known as: NORVASC   aspirin EC 81 MG tablet   atorvastatin 10 MG tablet Commonly known as: LIPITOR   metFORMIN 500 MG tablet Commonly known as: GLUCOPHAGE   mirtazapine 7.5 MG tablet Commonly known as: REMERON   Senna 8.6 MG Caps   sertraline 50  MG tablet Commonly known as: ZOLOFT       TAKE these medications    acetaminophen  325 MG tablet Commonly known as: TYLENOL  Take 2 tablets (650 mg total) by mouth every 6 (six) hours as needed for mild pain (pain score 1-3) (or Fever >/= 101). What changed:  medication strength how much to take reasons to take this   antiseptic oral rinse Liqd Apply 15 mLs topically 2 (two) times daily.   docusate sodium  100 MG capsule Commonly known as: COLACE Take 1 capsule (100 mg total) by mouth 2 (two) times daily as needed for mild constipation.   glycopyrrolate  1 MG tablet Commonly known as: ROBINUL  Take 1 tablet (1 mg total) by mouth every 4 (four) hours as needed (excessive secretions).   haloperidol  2 MG tablet Commonly known as: HALDOL  Take 1 tablet (2 mg total) by mouth every 6 (six) hours as needed for agitation (or delirium).   LORazepam  1 MG tablet Commonly known as: ATIVAN  Take 1 tablet (1 mg total) by mouth every 4 (four) hours as needed for  anxiety, seizure or sleep.   ondansetron  4 MG disintegrating tablet Commonly known as: ZOFRAN -ODT Take 1 tablet (4 mg total) by mouth every 6 (six) hours as needed for nausea.   oxyCODONE -acetaminophen  5-325 MG tablet Commonly known as: PERCOCET/ROXICET Take 1 tablet by mouth at bedtime.   polyethylene glycol 17 g packet Commonly known as: MIRALAX  / GLYCOLAX  Take 17 g by mouth daily as needed for moderate constipation.   polyvinyl alcohol  1.4 % ophthalmic solution Commonly known as: LIQUIFILM TEARS Place 1 drop into both eyes 4 (four) times daily as needed for dry eyes.               Discharge Care Instructions  (From admission, onward)           Start     Ordered   04/01/23 0000  Discharge wound care:       Comments: Frequent Turning and Pressure offloading   04/01/23 1107            Discharge Exam: Filed Weights   03/29/23 0407 03/30/23 0503 03/31/23 0500  Weight: 70.5 kg 70.5 kg 72 kg   Vitals:   03/31/23 1935 04/01/23 0747  BP: (!) 142/82 (!) 150/94  Pulse: 72 65  Resp: 18 18  Temp: 97.6 F (36.4 C) 98.4 F (36.9 C)  SpO2: 97%    Examination: Physical Exam:  Constitutional: Elderly frail chronically ill-appearing Caucasian male who appears resting and has dry mucous membranes and appears somewhat comfortable Respiratory: Diminished to auscultation bilaterally with some coarse breath sounds, no wheezing, rales, rhonchi or crackles. Normal respiratory effort and patient is not tachypenic. No accessory muscle use.  Unlabored breathing Cardiovascular: RRR, no murmurs / rubs / gallops. S1 and S2 auscultated.  Has some slight extremity edema.  Abdomen: Soft, little tender to palpate, distended secondary to body habitus. Bowel sounds positive.  GU: Deferred. Musculoskeletal: No clubbing / cyanosis of digits/nails. No joint deformity upper and lower extremities Skin: Did not turn him given his sacral ulcer and he does have some facial seborrheic  keratosis Neurologic: He is somnolent and drowsy and very lethargic continues to be dysarthric and difficult to understand Psychiatric: Impaired judgment and insight.  He appears comfortable and calm  Condition at discharge:  Guarded  The results of significant diagnostics from this hospitalization (including imaging, microbiology, ancillary and laboratory) are listed below for reference.   Imaging Studies: DG CHEST PORT 1  VIEW Result Date: 03/31/2023 CLINICAL DATA:  Shortness of breath. EXAM: PORTABLE CHEST 1 VIEW COMPARISON:  Chest x-ray from yesterday. FINDINGS: The heart size and mediastinal contours are within normal limits. Normal pulmonary vascularity. Continued low lung volumes. Unchanged small left pleural effusion with adjacent left basilar atelectasis. No pneumothorax. No acute osseous abnormality. IMPRESSION: 1. Unchanged small left pleural effusion with adjacent left basilar atelectasis. Electronically Signed   By: Elsie ONEIDA Shoulder M.D.   On: 03/31/2023 11:23   DG CHEST PORT 1 VIEW Result Date: 03/30/2023 CLINICAL DATA:  Shortness of breath. EXAM: PORTABLE CHEST 1 VIEW COMPARISON:  March 27, 2023. FINDINGS: Stable cardiomediastinal silhouette. Able calcified granuloma seen in right lower lobe. Mild left basilar atelectasis is noted with possible small pleural effusion. Bony thorax is unremarkable. IMPRESSION: Mild left basilar subsegmental atelectasis is noted with possible small left pleural effusion. Electronically Signed   By: Lynwood Landy Raddle M.D.   On: 03/30/2023 09:00   DG CHEST PORT 1 VIEW Result Date: 03/27/2023 CLINICAL DATA:  Hypoxia EXAM: PORTABLE CHEST 1 VIEW COMPARISON:  03/26/2023 FINDINGS: Low lung volumes. Calcified granuloma in the right lower lung. Patchy atelectasis at the left base. Stable cardiomediastinal silhouette with aortic atherosclerosis IMPRESSION: Low lung volumes with patchy atelectasis at the left base. Electronically Signed   By: Luke Bun M.D.   On:  03/27/2023 01:17   CT Head Wo Contrast Result Date: 03/26/2023 CLINICAL DATA:  Mental status change EXAM: CT HEAD WITHOUT CONTRAST TECHNIQUE: Contiguous axial images were obtained from the base of the skull through the vertex without intravenous contrast. RADIATION DOSE REDUCTION: This exam was performed according to the departmental dose-optimization program which includes automated exposure control, adjustment of the mA and/or kV according to patient size and/or use of iterative reconstruction technique. COMPARISON:  CT 03/09/2021 FINDINGS: Brain: No hemorrhage or intracranial mass. Age indeterminate infarct in the right cerebellum. Left occipital hypodensity suspicious for acute to subacute infarct. Atrophy and chronic small vessel ischemic changes of the white matter. The ventricles are nonenlarged. Vascular: No hyperdense vessels.  Carotid vascular calcification Skull: Normal. Negative for fracture or focal lesion. Sinuses/Orbits: No acute finding. Other: Rotated appearance of C1 on C2 presumably due to head positioning IMPRESSION: 1. Left occipital hypodensity suspicious for acute to subacute infarct. Age indeterminate infarct in the right cerebellum. No acute intracranial hemorrhage. 2. Atrophy and chronic small vessel ischemic changes of the white matter. Electronically Signed   By: Luke Bun M.D.   On: 03/26/2023 22:47   DG Chest Port 1 View Result Date: 03/26/2023 CLINICAL DATA:  Altered mental status, unresponsive EXAM: PORTABLE CHEST 1 VIEW COMPARISON:  None Available. FINDINGS: Lungs volumes are small, but are symmetric and are clear. No pneumothorax or pleural effusion. Cardiac size within normal limits. Pulmonary vascularity is normal. Osseous structures are age-appropriate. No acute bone abnormality. IMPRESSION: 1. Pulmonary hypoinflation. Electronically Signed   By: Dorethia Molt M.D.   On: 03/26/2023 21:54   Microbiology: Results for orders placed or performed during the hospital  encounter of 03/26/23  Culture, blood (Routine x 2)     Status: Abnormal   Collection Time: 03/26/23  8:35 PM   Specimen: BLOOD  Result Value Ref Range Status   Specimen Description BLOOD LEFT ANTECUBITAL  Final   Special Requests   Final    BOTTLES DRAWN AEROBIC AND ANAEROBIC Blood Culture adequate volume   Culture  Setup Time   Final    GRAM POSITIVE COCCI IN CLUSTERS IN BOTH AEROBIC AND  ANAEROBIC BOTTLES CRITICAL RESULT CALLED TO, READ BACK BY AND VERIFIED WITH: PHARMD TWEE DANG ON 03/27/23 @ 1808 BY DRT    Culture (A)  Final    STAPHYLOCOCCUS HOMINIS THE SIGNIFICANCE OF ISOLATING THIS ORGANISM FROM A SINGLE SET OF BLOOD CULTURES WHEN MULTIPLE SETS ARE DRAWN IS UNCERTAIN. PLEASE NOTIFY THE MICROBIOLOGY DEPARTMENT WITHIN ONE WEEK IF SPECIATION AND SENSITIVITIES ARE REQUIRED. Performed at [facility], [address], [place], [place] [address]    Report Status 03/29/2023 FINAL  Final  Blood Culture ID Panel (Reflexed)     Status: Abnormal   Collection Time: 03/26/23  8:35 PM  Result Value Ref Range Status   Enterococcus faecalis NOT DETECTED NOT DETECTED Final   Enterococcus Faecium NOT DETECTED NOT DETECTED Final   Listeria monocytogenes NOT DETECTED NOT DETECTED Final   Staphylococcus species DETECTED (A) NOT DETECTED Final    Comment: CRITICAL RESULT CALLED TO, READ BACK BY AND VERIFIED WITH: PHARMD TWEE DANG ON 03/27/23 @ 1808 BY DRT    Staphylococcus aureus (BCID) NOT DETECTED NOT DETECTED Final   Staphylococcus epidermidis NOT DETECTED NOT DETECTED Final   Staphylococcus lugdunensis NOT DETECTED NOT DETECTED Final   Streptococcus species NOT DETECTED NOT DETECTED Final   Streptococcus agalactiae NOT DETECTED NOT DETECTED Final   Streptococcus pneumoniae NOT DETECTED NOT DETECTED Final   Streptococcus pyogenes NOT DETECTED NOT DETECTED Final   A.calcoaceticus-baumannii NOT DETECTED NOT DETECTED Final   Bacteroides fragilis NOT DETECTED NOT DETECTED Final    Enterobacterales NOT DETECTED NOT DETECTED Final   Enterobacter cloacae complex NOT DETECTED NOT DETECTED Final   Escherichia coli NOT DETECTED NOT DETECTED Final   Klebsiella aerogenes NOT DETECTED NOT DETECTED Final   Klebsiella oxytoca NOT DETECTED NOT DETECTED Final   Klebsiella pneumoniae NOT DETECTED NOT DETECTED Final   Proteus species NOT DETECTED NOT DETECTED Final   Salmonella species NOT DETECTED NOT DETECTED Final   Serratia marcescens NOT DETECTED NOT DETECTED Final   Haemophilus influenzae NOT DETECTED NOT DETECTED Final   Neisseria meningitidis NOT DETECTED NOT DETECTED Final   Pseudomonas aeruginosa NOT DETECTED NOT DETECTED Final   Stenotrophomonas maltophilia NOT DETECTED NOT DETECTED Final   Candida albicans NOT DETECTED NOT DETECTED Final   Candida auris NOT DETECTED NOT DETECTED Final   Candida glabrata NOT DETECTED NOT DETECTED Final   Candida krusei NOT DETECTED NOT DETECTED Final   Candida parapsilosis NOT DETECTED NOT DETECTED Final   Candida tropicalis NOT DETECTED NOT DETECTED Final   Cryptococcus neoformans/gattii NOT DETECTED NOT DETECTED Final    Comment: Performed at [facility], [address] [place], [place] [address]  Resp panel by RT-PCR (RSV, Flu A&B, Covid) Anterior Nasal Swab     Status: None   Collection Time: 03/26/23  8:40 PM   Specimen: Anterior Nasal Swab  Result Value Ref Range Status   SARS Coronavirus 2 by RT PCR NEGATIVE NEGATIVE Final   Influenza A by PCR NEGATIVE NEGATIVE Final   Influenza B by PCR NEGATIVE NEGATIVE Final    Comment: (NOTE) The Xpert Xpress SARS-CoV-2/FLU/RSV plus assay is intended as an aid in the diagnosis of influenza from Nasopharyngeal swab specimens and should not be used as a sole basis for treatment. Nasal washings and aspirates are unacceptable for Xpert Xpress SARS-CoV-2/FLU/RSV testing.  Fact Sheet for Patients: bloggercourse.com  Fact Sheet for Healthcare  Providers: seriousbroker.it  This test is not yet approved or cleared by the [place]  FDA and has been authorized  for detection and/or diagnosis of SARS-CoV-2 by FDA under an Emergency Use Authorization (EUA). This EUA will remain in effect (meaning this test can be used) for the duration of the COVID-19 declaration under Section 564(b)(1) of the Act, 21 U.S.C. section 360bbb-3(b)(1), unless the authorization is terminated or revoked.     Resp Syncytial Virus by PCR NEGATIVE NEGATIVE Final    Comment: (NOTE) Fact Sheet for Patients: bloggercourse.com  Fact Sheet for Healthcare Providers: seriousbroker.it  This test is not yet approved or cleared by the [place]  FDA and has been authorized for detection and/or diagnosis of SARS-CoV-2 by FDA under an Emergency Use Authorization (EUA). This EUA will remain in effect (meaning this test can be used) for the duration of the COVID-19 declaration under Section 564(b)(1) of the Act, 21 U.S.C. section 360bbb-3(b)(1), unless the authorization is terminated or revoked.  Performed at [facility], [address] [place], [place] [address]   Respiratory (~20 pathogens) panel by PCR     Status: None   Collection Time: 03/26/23  8:40 PM   Specimen: Nasopharyngeal Swab; Respiratory  Result Value Ref Range Status   Adenovirus NOT DETECTED NOT DETECTED Final   Coronavirus 229E NOT DETECTED NOT DETECTED Final    Comment: (NOTE) The Coronavirus on the Respiratory Panel, DOES NOT test for the novel  Coronavirus (2019 nCoV)    Coronavirus HKU1 NOT DETECTED NOT DETECTED Final   Coronavirus NL63 NOT DETECTED NOT DETECTED Final   Coronavirus OC43 NOT DETECTED NOT DETECTED Final   Metapneumovirus NOT DETECTED NOT DETECTED Final   Rhinovirus / Enterovirus NOT DETECTED NOT DETECTED Final   Influenza A NOT DETECTED NOT DETECTED Final   Influenza B NOT DETECTED  NOT DETECTED Final   Parainfluenza Virus 1 NOT DETECTED NOT DETECTED Final   Parainfluenza Virus 2 NOT DETECTED NOT DETECTED Final   Parainfluenza Virus 3 NOT DETECTED NOT DETECTED Final   Parainfluenza Virus 4 NOT DETECTED NOT DETECTED Final   Respiratory Syncytial Virus NOT DETECTED NOT DETECTED Final   Bordetella pertussis NOT DETECTED NOT DETECTED Final   Bordetella Parapertussis NOT DETECTED NOT DETECTED Final   Chlamydophila pneumoniae NOT DETECTED NOT DETECTED Final   Mycoplasma pneumoniae NOT DETECTED NOT DETECTED Final    Comment: Performed at [facility], [address] [place], [place] [address]  Culture, blood (Routine x 2)     Status: None   Collection Time: 03/26/23  8:42 PM   Specimen: BLOOD RIGHT HAND  Result Value Ref Range Status   Specimen Description BLOOD RIGHT HAND  Final   Special Requests   Final    BOTTLES DRAWN AEROBIC AND ANAEROBIC Blood Culture adequate volume   Culture   Final    NO GROWTH 5 DAYS Performed at [facility], [address], [place], [place] [address]    Report Status 03/31/2023 FINAL  Final  MRSA Next Gen by PCR, Nasal     Status: Abnormal   Collection Time: 03/27/23  1:00 AM   Specimen: Nasal Mucosa; Nasal Swab  Result Value Ref Range Status   MRSA by PCR Next Gen DETECTED (A) NOT DETECTED Final    Comment: RESULT CALLED TO, READ BACK BY AND VERIFIED WITH: B SIMPSON,RN@0436  03/27/23 MK (NOTE) The GeneXpert MRSA Assay (FDA approved for NASAL specimens only), is one component of a comprehensive MRSA colonization surveillance program. It is not intended to diagnose MRSA infection nor to guide or monitor treatment for MRSA infections. Test performance is  not FDA approved in patients less than 60 years old. Performed at [facility], [address], [place], [place] [address]    Labs: CBC: Recent Labs  Lab 03/26/23 2035 03/27/23 [id number] 03/28/23 0543 03/29/23 0737 03/29/23 0922 03/30/23 0720 03/31/23 0611  WBC  13.6*   < > 18.0* 12.3* 12.0* 12.0* 11.8*  NEUTROABS 10.6*  --   --   --  9.5* 9.2* 8.9*  HGB 14.8   < > 12.7* 11.9* 11.8* 10.9* 12.2*  HCT 48.3   < > 41.2 37.2* 36.8* 33.6* 37.8*  MCV 97.0   < > 97.6 92.5 92.9 93.1 92.2  PLT 302   < > 247 255 244 222 255   < > = values in this interval not displayed.   Basic Metabolic Panel: Recent Labs  Lab 03/26/23 2035 03/27/23 0617 03/28/23 0543 03/29/23 0737 03/29/23 0922 03/30/23 0720 03/31/23 0611  NA 154*   < > 148* 149* 147* 142 140  K 4.8   < > 3.6 2.8* 2.8* 3.3* 3.3*  CL 109   < > 110 110 113* 110 108  CO2 27   < > 22 22 20* 24 23  GLUCOSE 160*   < > 122* 105* 108* 199* 109*  BUN 59*   < > 45* 42* 43* 32* 24*  CREATININE 2.30*   < > 1.47* 1.40* 1.44* 1.24 1.00  CALCIUM 8.3*   < > 7.5* 7.5* 7.2* 7.0* 7.3*  MG 1.8  --   --   --  1.4* 2.2 1.8  PHOS  --   --   --   --  2.0* 3.2 2.4*   < > = values in this interval not displayed.   Liver Function Tests: Recent Labs  Lab 03/26/23 2035 03/29/23 0922 03/30/23 0720 03/31/23 0611  AST 102* 39 32 25  ALT 42 28 25 24   ALKPHOS 70 55 51 63  BILITOT 1.1 1.3* 0.7 1.0  PROT 7.3 5.2* 5.0* 5.3*  ALBUMIN  3.1* 2.2* 2.1* 2.2*   CBG: Recent Labs  Lab 03/30/23 1934 03/31/23 0000 03/31/23 0350 03/31/23 0753 03/31/23 0810  GLUCAP 116* 100* 93 102* 112*   Discharge time spent: greater than 30 minutes.  Signed: Alejandro Marker, DO [facility] 04/01/2023

## 2023-04-01 NOTE — Progress Notes (Signed)
  [facility] Liaison Note   [facility] continues to follow for discharge disposition for hospice services at SNF.     Please call with any hospice related questions or concerns.        Thank you for the opportunity to participate in this patient's plan of care.   Nat Babe, BSN, [facility] Liaison [phone]

## 2023-04-01 NOTE — Progress Notes (Signed)
 Daily Progress Note   Patient Name: William Mullins       Date: 04/01/2023 DOB: Sep 12, 1931  Age: 89 y.o. MRN#: [id number] Attending Physician: Sherrill Alejandro Donovan, DO Primary Care Physician: Cleotilde Planas, MD Admit Date: 03/26/2023  Reason for Consultation/Follow-up: Establishing goals of care   Length of Stay: 5  Current Medications: Scheduled Meds:   antiseptic oral rinse  15 mL Topical BID   Chlorhexidine  Gluconate Cloth  6 each Topical Daily    Continuous Infusions:   PRN Meds: acetaminophen  **OR** acetaminophen , diphenhydrAMINE , docusate sodium , fentaNYL  (SUBLIMAZE ) injection, glycopyrrolate  **OR** glycopyrrolate  **OR** glycopyrrolate , haloperidol  **OR** haloperidol  **OR** haloperidol  lactate, LORazepam  **OR** LORazepam  **OR** LORazepam , ondansetron  **OR** ondansetron  (ZOFRAN ) IV, polyethylene glycol, polyvinyl alcohol   Physical Exam Constitutional:      Appearance: He is ill-appearing.  HENT:     Mouth/Throat:     Mouth: Mucous membranes are dry.  Pulmonary:     Effort: Pulmonary effort is normal.     Comments: Even and unlabored Skin:    General: Skin is warm and dry.  Neurological:     Mental Status: He is lethargic.             Vital Signs: BP (!) 150/94 (BP Location: Right Arm)   Pulse 65   Temp 98.4 F (36.9 C)   Resp 18   Ht 5' 4 (1.626 m)   Wt 72 kg   SpO2 97%   BMI 27.25 kg/m  SpO2: SpO2: 97 % O2 Device: O2 Device: Room Air O2 Flow Rate: O2 Flow Rate (L/min): 3 L/min        Palliative Assessment/Data: 10%      Patient Active Problem List   Diagnosis Date Noted   Protein-calorie malnutrition, severe 03/29/2023   Septic shock (HCC) 03/27/2023   DNR (do not resuscitate) 03/27/2023   Pressure injury of skin 03/27/2023    Palliative Care  Assessment & Plan   Patient Profile: 89 y.o. male  with past medical history of Afib, CKD, DM, and HTN  admitted on 03/26/2023 from [facility] SNF with acute respiratory failure, reduced responsiveness, low oxygen saturations, shallow respirations, and diaphoresis. In the ED he had a temp of 103.3 F. He was started on antibiotics and required peripheral vasopressors. He was weaned off vasopressors. Failed swallow study and has been NPO since  03/30/2023.   Transitioned to full comfort measures 03/31/23.   Subjective: Patient asleep in bed in NAD. His breathing is even and unlabored. He received two doses of prn fentanyl  yesterday. He is groaning a little. RN gave fentanyl  shortly after. No family at bedside  9:00: Spoke with patient's son Garrel. Discussed comfort measures and plan to go back to facility with [facility] hospice services. Message from liaison yesterday said a nurse would see him Tuesday. Son is eager to get patient back to facility. Encouraged him to call PMT with questions or concerns.  Recommendations/Plan: DNR Comfort measures Back to facility with hospice- TOC and Hospice Liaison notified Continued PMT support     Comfort medications for symptom management Fentanyl  PRN for pain/air hunger/comfort Robinul  PRN for excessive secretions Ativan  PRN for agitation/anxiety Zofran  PRN for nausea Liquifilm tears PRN for dry eyes Haldol  PRN for agitation/anxiety May have comfort feeding Unrestricted visitations in the setting of EOL (per policy) Oxygen PRN 2L or less for comfort. No escalation.   Code Status:    Code Status Orders  (From admission, onward)           Start     Ordered   03/31/23 0956  Do not attempt resuscitation (DNR) - Comfort care  Continuous       Question Answer Comment  If patient has no pulse and is not breathing Do Not Attempt Resuscitation   In Pre-Arrest Conditions (Patient Is Breathing and Has a Pulse) Provide comfort measures. Relieve any  mechanical airway obstruction. Avoid transfer unless required for comfort.   Consent: Discussion documented in EHR or advanced directives reviewed      03/31/23 0958         Extensive chart review has been completed prior to seeing the patient including progress/consult notes, orders, medications, and available advance directive documents.   Time spent: 25 minutes  Thank you for allowing the Palliative Medicine Team to assist in the care of this patient.   Stephane CHRISTELLA Palin, NP  Please contact Palliative Medicine Team phone at [phone] for questions and concerns.

## 2023-04-01 NOTE — TOC Transition Note (Signed)
 Transition of Care [facility]) - Discharge Note   Patient Details  Name: William Mullins MRN: [id number] Date of Birth: [birth year]  [facility] CM/SW Contact:  Gwenn Julien Norris, [place] Phone Number: 04/01/2023, 11:38 AM   Clinical Narrative: Pt for dc back to [facility] today. Left voicemail for pt's son Garrel notifying him of dc. Nat with [facility] to reach out to pt's son to arrange time for their hospice nurse to meet pt at [facility] tomorrow. Confirmed with Kia in [facility] admissions they are prepared to admit pt back to room 208. RN provided with number for report and [facility] arranged for transport. SW signing off at dc.   Julien Gwenn, MSW, LCSW [phone] (coverage)      Final next level of care: Skilled Nursing Facility Barriers to Discharge: Barriers Resolved   Patient Goals and CMS Choice            Discharge Placement              Patient chooses bed at: [facility] Patient to be transferred to facility by: [facility] Name of family member notified: Mike/son Patient and family notified of of transfer: 04/01/23  Discharge Plan and Services Additional resources added to the After Visit Summary for   In-house Referral: Clinical Social Work                                   Social Drivers of Health (SDOH) Interventions SDOH Screenings   Food Insecurity: Patient Unable To Answer (03/27/2023)  Housing: Patient Unable To Answer (03/27/2023)  Transportation Needs: Patient Unable To Answer (03/27/2023)  Utilities: Patient Unable To Answer (03/27/2023)  Social Connections: Patient Unable To Answer (03/27/2023)  Tobacco Use: Low Risk  (03/26/2023)     Readmission Risk Interventions     No data to display

## 2023-04-02 DIAGNOSIS — L89153 Pressure ulcer of sacral region, stage 3: Secondary | ICD-10-CM | POA: Diagnosis not present

## 2023-04-02 DIAGNOSIS — D649 Anemia, unspecified: Secondary | ICD-10-CM | POA: Diagnosis not present

## 2023-04-02 DIAGNOSIS — L89026 Pressure-induced deep tissue damage of left elbow: Secondary | ICD-10-CM | POA: Diagnosis not present

## 2023-04-02 DIAGNOSIS — R627 Adult failure to thrive: Secondary | ICD-10-CM | POA: Diagnosis not present

## 2023-04-02 DIAGNOSIS — R531 Weakness: Secondary | ICD-10-CM | POA: Diagnosis not present

## 2023-04-02 DIAGNOSIS — E119 Type 2 diabetes mellitus without complications: Secondary | ICD-10-CM | POA: Diagnosis not present

## 2023-04-02 DIAGNOSIS — I87312 Chronic venous hypertension (idiopathic) with ulcer of left lower extremity: Secondary | ICD-10-CM | POA: Diagnosis not present

## 2023-04-21 DEATH — deceased
